# Patient Record
Sex: Female | Born: 1935 | Race: White | Hispanic: No | Smoking: Never smoker
Health system: Southern US, Community
[De-identification: ages and names within clinical notes are randomized; demographics above are authoritative.]

## PROBLEM LIST (undated history)

## (undated) DIAGNOSIS — K209 Esophagitis, unspecified without bleeding: Secondary | ICD-10-CM

## (undated) DIAGNOSIS — D126 Benign neoplasm of colon, unspecified: Secondary | ICD-10-CM

## (undated) DIAGNOSIS — I5032 Chronic diastolic (congestive) heart failure: Secondary | ICD-10-CM

## (undated) DIAGNOSIS — D649 Anemia, unspecified: Secondary | ICD-10-CM

## (undated) DIAGNOSIS — K449 Diaphragmatic hernia without obstruction or gangrene: Secondary | ICD-10-CM

## (undated) DIAGNOSIS — I4891 Unspecified atrial fibrillation: Principal | ICD-10-CM

## (undated) DIAGNOSIS — R1310 Dysphagia, unspecified: Secondary | ICD-10-CM

## (undated) DIAGNOSIS — I10 Essential (primary) hypertension: Secondary | ICD-10-CM

## (undated) DIAGNOSIS — K219 Gastro-esophageal reflux disease without esophagitis: Secondary | ICD-10-CM

## (undated) DIAGNOSIS — I350 Nonrheumatic aortic (valve) stenosis: Secondary | ICD-10-CM

## (undated) DIAGNOSIS — Z8679 Personal history of other diseases of the circulatory system: Secondary | ICD-10-CM

## (undated) HISTORY — DX: Benign neoplasm of colon, unspecified: D12.6

## (undated) HISTORY — PX: ESOPHAGEAL DILATION: SHX303

## (undated) HISTORY — PX: TONSILLECTOMY: SUR1361

---

## 1965-01-26 HISTORY — PX: ABDOMINAL HYSTERECTOMY: SHX81

## 1987-10-01 ENCOUNTER — Encounter (INDEPENDENT_AMBULATORY_CARE_PROVIDER_SITE_OTHER): Payer: Self-pay | Admitting: Gastroenterology

## 1987-10-01 DIAGNOSIS — K209 Esophagitis, unspecified without bleeding: Secondary | ICD-10-CM | POA: Insufficient documentation

## 1988-09-26 HISTORY — PX: CATARACT EXTRACTION W/ INTRAOCULAR LENS  IMPLANT, BILATERAL: SHX1307

## 1998-08-02 ENCOUNTER — Encounter: Payer: Self-pay | Admitting: Gastroenterology

## 1998-08-02 DIAGNOSIS — K5730 Diverticulosis of large intestine without perforation or abscess without bleeding: Secondary | ICD-10-CM | POA: Insufficient documentation

## 2000-06-02 ENCOUNTER — Encounter: Payer: Self-pay | Admitting: Gastroenterology

## 2000-08-04 ENCOUNTER — Encounter (INDEPENDENT_AMBULATORY_CARE_PROVIDER_SITE_OTHER): Payer: Self-pay | Admitting: Specialist

## 2000-08-04 ENCOUNTER — Ambulatory Visit (HOSPITAL_COMMUNITY): Admission: RE | Admit: 2000-08-04 | Discharge: 2000-08-04 | Payer: Self-pay | Admitting: Oncology

## 2002-08-15 ENCOUNTER — Emergency Department (HOSPITAL_COMMUNITY): Admission: EM | Admit: 2002-08-15 | Discharge: 2002-08-16 | Payer: Self-pay | Admitting: *Deleted

## 2002-12-11 ENCOUNTER — Encounter: Payer: Self-pay | Admitting: Gastroenterology

## 2002-12-11 ENCOUNTER — Ambulatory Visit (HOSPITAL_COMMUNITY): Admission: RE | Admit: 2002-12-11 | Discharge: 2002-12-11 | Payer: Self-pay | Admitting: Gastroenterology

## 2002-12-12 ENCOUNTER — Ambulatory Visit (HOSPITAL_COMMUNITY): Admission: RE | Admit: 2002-12-12 | Discharge: 2002-12-12 | Payer: Self-pay | Admitting: Gastroenterology

## 2002-12-13 ENCOUNTER — Ambulatory Visit (HOSPITAL_COMMUNITY): Admission: RE | Admit: 2002-12-13 | Discharge: 2002-12-13 | Payer: Self-pay | Admitting: Gastroenterology

## 2002-12-13 ENCOUNTER — Encounter: Payer: Self-pay | Admitting: Gastroenterology

## 2004-02-21 ENCOUNTER — Ambulatory Visit: Payer: Self-pay | Admitting: Family Medicine

## 2004-05-28 ENCOUNTER — Ambulatory Visit: Payer: Self-pay | Admitting: Family Medicine

## 2004-09-12 ENCOUNTER — Ambulatory Visit: Payer: Self-pay | Admitting: Oncology

## 2004-11-19 ENCOUNTER — Ambulatory Visit: Payer: Self-pay | Admitting: Family Medicine

## 2004-12-24 ENCOUNTER — Ambulatory Visit: Payer: Self-pay | Admitting: Family Medicine

## 2005-04-01 ENCOUNTER — Ambulatory Visit: Payer: Self-pay | Admitting: Family Medicine

## 2005-07-24 ENCOUNTER — Ambulatory Visit: Payer: Self-pay | Admitting: Family Medicine

## 2005-07-27 ENCOUNTER — Ambulatory Visit: Payer: Self-pay | Admitting: Family Medicine

## 2005-08-11 ENCOUNTER — Emergency Department (HOSPITAL_COMMUNITY): Admission: EM | Admit: 2005-08-11 | Discharge: 2005-08-11 | Payer: Self-pay | Admitting: Emergency Medicine

## 2005-08-17 ENCOUNTER — Ambulatory Visit: Payer: Self-pay | Admitting: Family Medicine

## 2005-08-25 ENCOUNTER — Ambulatory Visit: Payer: Self-pay | Admitting: Gastroenterology

## 2005-09-10 ENCOUNTER — Ambulatory Visit: Payer: Self-pay | Admitting: Gastroenterology

## 2005-09-10 DIAGNOSIS — K449 Diaphragmatic hernia without obstruction or gangrene: Secondary | ICD-10-CM | POA: Insufficient documentation

## 2005-09-16 ENCOUNTER — Ambulatory Visit: Payer: Self-pay | Admitting: Family Medicine

## 2005-09-25 ENCOUNTER — Ambulatory Visit: Payer: Self-pay | Admitting: Oncology

## 2005-09-30 LAB — CBC WITH DIFFERENTIAL/PLATELET
BASO%: 0.4 % (ref 0.0–2.0)
Basophils Absolute: 0 10*3/uL (ref 0.0–0.1)
EOS%: 1.4 % (ref 0.0–7.0)
HCT: 29 % — ABNORMAL LOW (ref 34.8–46.6)
MCH: 30.6 pg (ref 26.0–34.0)
MCHC: 35.3 g/dL (ref 32.0–36.0)
MCV: 86.9 fL (ref 81.0–101.0)
MONO%: 8.9 % (ref 0.0–13.0)
NEUT%: 73.4 % (ref 39.6–76.8)
lymph#: 0.8 10*3/uL — ABNORMAL LOW (ref 0.9–3.3)

## 2005-09-30 LAB — MORPHOLOGY

## 2005-10-02 LAB — COMPREHENSIVE METABOLIC PANEL
Albumin: 4.4 g/dL (ref 3.5–5.2)
Alkaline Phosphatase: 65 U/L (ref 39–117)
BUN: 15 mg/dL (ref 6–23)
CO2: 29 mEq/L (ref 19–32)
Calcium: 9 mg/dL (ref 8.4–10.5)
Chloride: 102 mEq/L (ref 96–112)
Glucose, Bld: 105 mg/dL — ABNORMAL HIGH (ref 70–99)
Potassium: 3.9 mEq/L (ref 3.5–5.3)
Sodium: 139 mEq/L (ref 135–145)
Total Protein: 6.2 g/dL (ref 6.0–8.3)

## 2005-10-02 LAB — LACTATE DEHYDROGENASE: LDH: 118 U/L (ref 94–250)

## 2005-10-02 LAB — SEDIMENTATION RATE: Sed Rate: 8 mm/hr (ref 0–22)

## 2005-10-02 LAB — FERRITIN: Ferritin: 96 ng/mL (ref 10–291)

## 2005-10-02 LAB — PROTEIN ELECTROPHORESIS, SERUM
Albumin ELP: 66.1 % (ref 55.8–66.1)
Alpha-1-Globulin: 4.4 % (ref 2.9–4.9)
Alpha-2-Globulin: 7.5 % (ref 7.1–11.8)
Total Protein, Serum Electrophoresis: 6.2 g/dL (ref 6.0–8.3)

## 2005-10-02 LAB — IRON AND TIBC: %SAT: 26 % (ref 20–55)

## 2005-11-16 ENCOUNTER — Ambulatory Visit: Payer: Self-pay | Admitting: Family Medicine

## 2005-12-03 ENCOUNTER — Ambulatory Visit: Payer: Self-pay | Admitting: Oncology

## 2005-12-07 LAB — CBC & DIFF AND RETIC
BASO%: 0.3 % (ref 0.0–2.0)
Eosinophils Absolute: 0.1 10*3/uL (ref 0.0–0.5)
MCHC: 34.7 g/dL (ref 32.0–36.0)
MONO#: 0.5 10*3/uL (ref 0.1–0.9)
NEUT#: 5.3 10*3/uL (ref 1.5–6.5)
RBC: 3.42 10*6/uL — ABNORMAL LOW (ref 3.70–5.32)
RDW: 15.2 % — ABNORMAL HIGH (ref 11.3–14.5)
RETIC #: 109.8 10*3/uL (ref 19.7–115.1)
Retic %: 3.2 % — ABNORMAL HIGH (ref 0.4–2.3)
WBC: 6.7 10*3/uL (ref 3.9–10.0)
lymph#: 0.9 10*3/uL (ref 0.9–3.3)

## 2005-12-07 LAB — MORPHOLOGY

## 2005-12-07 LAB — CHCC SMEAR

## 2005-12-08 ENCOUNTER — Ambulatory Visit: Payer: Self-pay | Admitting: Family Medicine

## 2005-12-14 ENCOUNTER — Ambulatory Visit: Payer: Self-pay | Admitting: Family Medicine

## 2006-01-12 LAB — IRON AND TIBC
%SAT: 21 % (ref 20–55)
TIBC: 240 ug/dL — ABNORMAL LOW (ref 250–470)

## 2006-01-12 LAB — MORPHOLOGY: PLT EST: ADEQUATE

## 2006-01-12 LAB — CBC & DIFF AND RETIC
Basophils Absolute: 0 10*3/uL (ref 0.0–0.1)
Eosinophils Absolute: 0 10*3/uL (ref 0.0–0.5)
HCT: 30.7 % — ABNORMAL LOW (ref 34.8–46.6)
HGB: 10.5 g/dL — ABNORMAL LOW (ref 11.6–15.9)
LYMPH%: 12.8 % — ABNORMAL LOW (ref 14.0–48.0)
MCV: 87.1 fL (ref 81.0–101.0)
MONO%: 5.7 % (ref 0.0–13.0)
NEUT#: 4.4 10*3/uL (ref 1.5–6.5)
NEUT%: 80.5 % — ABNORMAL HIGH (ref 39.6–76.8)
Platelets: 222 10*3/uL (ref 145–400)
RDW: 15.7 % — ABNORMAL HIGH (ref 11.3–14.5)
RETIC #: 94.7 10*3/uL (ref 19.7–115.1)

## 2006-01-12 LAB — LACTATE DEHYDROGENASE: LDH: 123 U/L (ref 94–250)

## 2006-01-12 LAB — COMPREHENSIVE METABOLIC PANEL
ALT: 8 U/L (ref 0–35)
AST: 12 U/L (ref 0–37)
Alkaline Phosphatase: 71 U/L (ref 39–117)
Calcium: 9.1 mg/dL (ref 8.4–10.5)
Chloride: 103 mEq/L (ref 96–112)
Creatinine, Ser: 0.97 mg/dL (ref 0.40–1.20)
Total Bilirubin: 1 mg/dL (ref 0.3–1.2)

## 2006-01-12 LAB — FERRITIN: Ferritin: 117 ng/mL (ref 10–291)

## 2006-01-17 ENCOUNTER — Ambulatory Visit: Payer: Self-pay | Admitting: Oncology

## 2006-03-11 ENCOUNTER — Ambulatory Visit: Payer: Self-pay | Admitting: Family Medicine

## 2006-05-11 ENCOUNTER — Ambulatory Visit: Payer: Self-pay | Admitting: Oncology

## 2006-05-14 LAB — CBC WITH DIFFERENTIAL/PLATELET
Eosinophils Absolute: 0 10*3/uL (ref 0.0–0.5)
MCV: 85.2 fL (ref 81.0–101.0)
MONO#: 0.4 10*3/uL (ref 0.1–0.9)
MONO%: 6.7 % (ref 0.0–13.0)
NEUT#: 4.1 10*3/uL (ref 1.5–6.5)
RBC: 3.35 10*6/uL — ABNORMAL LOW (ref 3.70–5.32)
RDW: 15.7 % — ABNORMAL HIGH (ref 11.3–14.5)
WBC: 5.3 10*3/uL (ref 3.9–10.0)

## 2006-05-14 LAB — CHCC SMEAR

## 2006-05-14 LAB — MORPHOLOGY: Platelet Morphology: NORMAL

## 2006-05-14 LAB — ERYTHROCYTE SEDIMENTATION RATE: Sed Rate: 7 mm/hr (ref 0–30)

## 2006-07-12 ENCOUNTER — Ambulatory Visit: Payer: Self-pay | Admitting: Family Medicine

## 2006-08-04 ENCOUNTER — Ambulatory Visit: Payer: Self-pay | Admitting: Gastroenterology

## 2006-11-24 ENCOUNTER — Ambulatory Visit: Payer: Self-pay | Admitting: Oncology

## 2006-11-26 LAB — CBC & DIFF AND RETIC
BASO%: 0.1 % (ref 0.0–2.0)
Eosinophils Absolute: 0.1 10*3/uL (ref 0.0–0.5)
MONO#: 0.4 10*3/uL (ref 0.1–0.9)
NEUT#: 4.5 10*3/uL (ref 1.5–6.5)
RBC: 3.53 10*6/uL — ABNORMAL LOW (ref 3.70–5.32)
RDW: 15.8 % — ABNORMAL HIGH (ref 11.3–14.5)
RETIC #: 110.8 10*3/uL (ref 19.7–115.1)
Retic %: 3.1 % — ABNORMAL HIGH (ref 0.4–2.3)
WBC: 5.8 10*3/uL (ref 3.9–10.0)
lymph#: 0.8 10*3/uL — ABNORMAL LOW (ref 0.9–3.3)

## 2006-11-26 LAB — MORPHOLOGY

## 2006-11-29 LAB — COMPREHENSIVE METABOLIC PANEL
AST: 12 U/L (ref 0–37)
Albumin: 4.4 g/dL (ref 3.5–5.2)
Alkaline Phosphatase: 67 U/L (ref 39–117)
BUN: 20 mg/dL (ref 6–23)
Calcium: 9.2 mg/dL (ref 8.4–10.5)
Creatinine, Ser: 0.77 mg/dL (ref 0.40–1.20)
Glucose, Bld: 101 mg/dL — ABNORMAL HIGH (ref 70–99)
Potassium: 4.1 mEq/L (ref 3.5–5.3)

## 2006-11-29 LAB — IRON AND TIBC
Iron: 40 ug/dL — ABNORMAL LOW (ref 42–145)
TIBC: 259 ug/dL (ref 250–470)
UIBC: 219 ug/dL

## 2006-11-29 LAB — FERRITIN: Ferritin: 128 ng/mL (ref 10–291)

## 2007-05-14 DIAGNOSIS — K219 Gastro-esophageal reflux disease without esophagitis: Secondary | ICD-10-CM

## 2007-05-14 DIAGNOSIS — I10 Essential (primary) hypertension: Secondary | ICD-10-CM

## 2007-05-14 DIAGNOSIS — D539 Nutritional anemia, unspecified: Secondary | ICD-10-CM

## 2007-05-14 DIAGNOSIS — R1319 Other dysphagia: Secondary | ICD-10-CM

## 2007-06-27 ENCOUNTER — Ambulatory Visit: Payer: Self-pay | Admitting: Oncology

## 2007-06-29 ENCOUNTER — Encounter: Payer: Self-pay | Admitting: Gastroenterology

## 2007-06-29 LAB — CBC & DIFF AND RETIC
Basophils Absolute: 0 10*3/uL (ref 0.0–0.1)
EOS%: 1.2 % (ref 0.0–7.0)
Eosinophils Absolute: 0.1 10*3/uL (ref 0.0–0.5)
HGB: 10 g/dL — ABNORMAL LOW (ref 11.6–15.9)
LYMPH%: 15.2 % (ref 14.0–48.0)
MCH: 30.5 pg (ref 26.0–34.0)
MCV: 85.8 fL (ref 81.0–101.0)
MONO%: 7.8 % (ref 0.0–13.0)
NEUT%: 75.5 % (ref 39.6–76.8)
Platelets: 205 10*3/uL (ref 145–400)
RDW: 16 % — ABNORMAL HIGH (ref 11.3–14.5)

## 2007-06-29 LAB — MORPHOLOGY: PLT EST: ADEQUATE

## 2007-06-29 LAB — COMPREHENSIVE METABOLIC PANEL
Alkaline Phosphatase: 59 U/L (ref 39–117)
BUN: 21 mg/dL (ref 6–23)
Creatinine, Ser: 0.93 mg/dL (ref 0.40–1.20)
Glucose, Bld: 96 mg/dL (ref 70–99)
Total Bilirubin: 1.1 mg/dL (ref 0.3–1.2)

## 2008-01-02 ENCOUNTER — Ambulatory Visit: Payer: Self-pay | Admitting: Oncology

## 2008-01-04 LAB — CBC & DIFF AND RETIC
BASO%: 0.3 % (ref 0.0–2.0)
Basophils Absolute: 0 10*3/uL (ref 0.0–0.1)
EOS%: 1.3 % (ref 0.0–7.0)
Eosinophils Absolute: 0.1 10*3/uL (ref 0.0–0.5)
HCT: 30.6 % — ABNORMAL LOW (ref 34.8–46.6)
HGB: 10.7 g/dL — ABNORMAL LOW (ref 11.6–15.9)
IRF: 0.37 — ABNORMAL HIGH (ref 0.130–0.330)
LYMPH%: 15 % (ref 14.0–48.0)
MCH: 30.8 pg (ref 26.0–34.0)
MCHC: 35 g/dL (ref 32.0–36.0)
MCV: 88 fL (ref 81.0–101.0)
MONO#: 0.3 10*3/uL (ref 0.1–0.9)
MONO%: 6.2 % (ref 0.0–13.0)
NEUT#: 3.6 10*3/uL (ref 1.5–6.5)
NEUT%: 77.2 % — ABNORMAL HIGH (ref 39.6–76.8)
Platelets: 208 10*3/uL (ref 145–400)
RBC: 3.47 10*6/uL — ABNORMAL LOW (ref 3.70–5.32)
RDW: 15.9 % — ABNORMAL HIGH (ref 11.3–14.5)
RETIC #: 127 10*3/uL — ABNORMAL HIGH (ref 19.7–115.1)
Retic %: 3.7 % — ABNORMAL HIGH (ref 0.4–2.3)
WBC: 4.7 10*3/uL (ref 3.9–10.0)
lymph#: 0.7 10*3/uL — ABNORMAL LOW (ref 0.9–3.3)

## 2008-01-04 LAB — CHCC SMEAR

## 2008-01-04 LAB — MORPHOLOGY: PLT EST: ADEQUATE

## 2008-01-05 LAB — LACTATE DEHYDROGENASE: LDH: 117 U/L (ref 94–250)

## 2008-01-05 LAB — COMPREHENSIVE METABOLIC PANEL
ALT: 11 U/L (ref 0–35)
AST: 13 U/L (ref 0–37)
CO2: 25 mEq/L (ref 19–32)
Chloride: 104 mEq/L (ref 96–112)
Sodium: 141 mEq/L (ref 135–145)
Total Bilirubin: 1.1 mg/dL (ref 0.3–1.2)
Total Protein: 6.6 g/dL (ref 6.0–8.3)

## 2008-01-12 ENCOUNTER — Encounter: Payer: Self-pay | Admitting: Gastroenterology

## 2008-07-09 ENCOUNTER — Ambulatory Visit: Payer: Self-pay | Admitting: Oncology

## 2008-07-11 ENCOUNTER — Encounter: Payer: Self-pay | Admitting: Gastroenterology

## 2008-07-11 LAB — CBC & DIFF AND RETIC
BASO%: 0.2 % (ref 0.0–2.0)
Basophils Absolute: 0 10*3/uL (ref 0.0–0.1)
EOS%: 1.3 % (ref 0.0–7.0)
Eosinophils Absolute: 0.1 10*3/uL (ref 0.0–0.5)
HCT: 29.4 % — ABNORMAL LOW (ref 34.8–46.6)
HGB: 10.6 g/dL — ABNORMAL LOW (ref 11.6–15.9)
IRF: 0.35 — ABNORMAL HIGH (ref 0.130–0.330)
LYMPH%: 16.7 % (ref 14.0–49.7)
MCH: 31.3 pg (ref 25.1–34.0)
MCHC: 36 g/dL (ref 31.5–36.0)
MCV: 86.9 fL (ref 79.5–101.0)
MONO#: 0.4 10*3/uL (ref 0.1–0.9)
MONO%: 8.6 % (ref 0.0–14.0)
NEUT#: 3.7 10*3/uL (ref 1.5–6.5)
NEUT%: 73.2 % (ref 38.4–76.8)
Platelets: 189 10*3/uL (ref 145–400)
RBC: 3.38 10*6/uL — ABNORMAL LOW (ref 3.70–5.45)
RDW: 16.4 % — ABNORMAL HIGH (ref 11.2–14.5)
RETIC #: 114.6 10*3/uL (ref 19.7–115.1)
Retic %: 3.4 % — ABNORMAL HIGH (ref 0.4–2.3)
WBC: 5 10*3/uL (ref 3.9–10.3)
lymph#: 0.8 10*3/uL — ABNORMAL LOW (ref 0.9–3.3)

## 2008-07-11 LAB — MORPHOLOGY: PLT EST: ADEQUATE

## 2008-07-11 LAB — COMPREHENSIVE METABOLIC PANEL
ALT: 11 U/L (ref 0–35)
AST: 17 U/L (ref 0–37)
Albumin: 4.1 g/dL (ref 3.5–5.2)
Alkaline Phosphatase: 51 U/L (ref 39–117)
BUN: 20 mg/dL (ref 6–23)
CO2: 31 mEq/L (ref 19–32)
Calcium: 9 mg/dL (ref 8.4–10.5)
Chloride: 102 mEq/L (ref 96–112)
Creatinine, Ser: 0.93 mg/dL (ref 0.40–1.20)
Glucose, Bld: 114 mg/dL — ABNORMAL HIGH (ref 70–99)
Potassium: 4 mEq/L (ref 3.5–5.3)
Sodium: 138 mEq/L (ref 135–145)
Total Bilirubin: 1.3 mg/dL — ABNORMAL HIGH (ref 0.3–1.2)
Total Protein: 6.3 g/dL (ref 6.0–8.3)

## 2008-07-11 LAB — LACTATE DEHYDROGENASE: LDH: 105 U/L (ref 94–250)

## 2008-07-11 LAB — CHCC SMEAR

## 2008-10-26 ENCOUNTER — Emergency Department (HOSPITAL_COMMUNITY): Admission: EM | Admit: 2008-10-26 | Discharge: 2008-10-26 | Payer: Self-pay | Admitting: Emergency Medicine

## 2008-10-26 ENCOUNTER — Telehealth: Payer: Self-pay | Admitting: Gastroenterology

## 2008-10-29 ENCOUNTER — Telehealth: Payer: Self-pay | Admitting: Gastroenterology

## 2008-10-29 ENCOUNTER — Ambulatory Visit: Payer: Self-pay | Admitting: Gastroenterology

## 2008-10-29 DIAGNOSIS — R197 Diarrhea, unspecified: Secondary | ICD-10-CM

## 2008-10-29 DIAGNOSIS — R143 Flatulence: Secondary | ICD-10-CM

## 2008-10-29 DIAGNOSIS — R141 Gas pain: Secondary | ICD-10-CM

## 2008-10-29 DIAGNOSIS — R142 Eructation: Secondary | ICD-10-CM

## 2008-10-29 LAB — CONVERTED CEMR LAB
ALT: 13 units/L (ref 0–35)
AST: 17 units/L (ref 0–37)
Albumin: 4.4 g/dL (ref 3.5–5.2)
Alkaline Phosphatase: 63 units/L (ref 39–117)
BUN: 15 mg/dL (ref 6–23)
Basophils Absolute: 0 10*3/uL (ref 0.0–0.1)
Basophils Relative: 0.2 % (ref 0.0–3.0)
CO2: 31 meq/L (ref 19–32)
Calcium: 9 mg/dL (ref 8.4–10.5)
Chloride: 107 meq/L (ref 96–112)
Creatinine, Ser: 0.9 mg/dL (ref 0.4–1.2)
Eosinophils Absolute: 0.1 10*3/uL (ref 0.0–0.7)
Eosinophils Relative: 0.8 % (ref 0.0–5.0)
GFR calc non Af Amer: 63.16 mL/min (ref >60)
Glucose, Bld: 110 mg/dL — ABNORMAL HIGH (ref 70–99)
HCT: 33.7 % — ABNORMAL LOW (ref 36.0–46.0)
Hemoglobin: 11.4 g/dL — ABNORMAL LOW (ref 12.0–15.0)
Lymphocytes Relative: 14.1 % (ref 12.0–46.0)
Lymphs Abs: 0.9 10*3/uL (ref 0.7–4.0)
MCHC: 34 g/dL (ref 30.0–36.0)
MCV: 89.3 fL (ref 78.0–100.0)
Monocytes Absolute: 0.5 10*3/uL (ref 0.1–1.0)
Monocytes Relative: 8.1 % (ref 3.0–12.0)
Neutro Abs: 4.8 10*3/uL (ref 1.4–7.7)
Neutrophils Relative %: 76.8 % (ref 43.0–77.0)
Platelets: 229 10*3/uL (ref 150.0–400.0)
Potassium: 3.6 meq/L (ref 3.5–5.1)
RBC: 3.77 M/uL — ABNORMAL LOW (ref 3.87–5.11)
RDW: 15.4 % — ABNORMAL HIGH (ref 11.5–14.6)
Sodium: 142 meq/L (ref 135–145)
TSH: 1.15 microintl units/mL (ref 0.35–5.50)
Total Bilirubin: 1.4 mg/dL — ABNORMAL HIGH (ref 0.3–1.2)
Total Protein: 7.6 g/dL (ref 6.0–8.3)
WBC: 6.3 10*3/uL (ref 4.5–10.5)

## 2008-11-01 ENCOUNTER — Ambulatory Visit: Payer: Self-pay | Admitting: Gastroenterology

## 2008-11-02 LAB — CONVERTED CEMR LAB
Ferritin: 92.3 ng/mL (ref 10.0–291.0)
Iron: 59 ug/dL (ref 42–145)
Saturation Ratios: 23.5 % (ref 20.0–50.0)
Transferrin: 179.4 mg/dL — ABNORMAL LOW (ref 212.0–360.0)
Vitamin B-12: 784 pg/mL (ref 211–911)

## 2008-11-14 ENCOUNTER — Encounter (INDEPENDENT_AMBULATORY_CARE_PROVIDER_SITE_OTHER): Payer: Self-pay | Admitting: *Deleted

## 2008-11-14 ENCOUNTER — Ambulatory Visit: Payer: Self-pay | Admitting: Gastroenterology

## 2008-11-14 DIAGNOSIS — R198 Other specified symptoms and signs involving the digestive system and abdomen: Secondary | ICD-10-CM

## 2008-11-26 DIAGNOSIS — D126 Benign neoplasm of colon, unspecified: Secondary | ICD-10-CM

## 2008-11-26 HISTORY — DX: Benign neoplasm of colon, unspecified: D12.6

## 2008-12-19 ENCOUNTER — Ambulatory Visit: Payer: Self-pay | Admitting: Gastroenterology

## 2008-12-25 ENCOUNTER — Encounter: Payer: Self-pay | Admitting: Gastroenterology

## 2009-04-15 ENCOUNTER — Encounter: Payer: Self-pay | Admitting: Gastroenterology

## 2009-07-26 ENCOUNTER — Ambulatory Visit: Payer: Self-pay | Admitting: Oncology

## 2009-07-31 LAB — COMPREHENSIVE METABOLIC PANEL
ALT: 10 U/L (ref 0–35)
AST: 14 U/L (ref 0–37)
Albumin: 4.2 g/dL (ref 3.5–5.2)
Alkaline Phosphatase: 61 U/L (ref 39–117)
BUN: 20 mg/dL (ref 6–23)
CO2: 22 mEq/L (ref 19–32)
Calcium: 9.2 mg/dL (ref 8.4–10.5)
Chloride: 102 mEq/L (ref 96–112)
Creatinine, Ser: 0.98 mg/dL (ref 0.40–1.20)
Glucose, Bld: 134 mg/dL — ABNORMAL HIGH (ref 70–99)
Potassium: 4.1 mEq/L (ref 3.5–5.3)
Sodium: 138 mEq/L (ref 135–145)
Total Bilirubin: 1.1 mg/dL (ref 0.3–1.2)
Total Protein: 6.4 g/dL (ref 6.0–8.3)

## 2009-07-31 LAB — CBC & DIFF AND RETIC
BASO%: 0.2 % (ref 0.0–2.0)
Basophils Absolute: 0 10*3/uL (ref 0.0–0.1)
EOS%: 2.2 % (ref 0.0–7.0)
Eosinophils Absolute: 0.1 10*3/uL (ref 0.0–0.5)
HCT: 30.3 % — ABNORMAL LOW (ref 34.8–46.6)
HGB: 10 g/dL — ABNORMAL LOW (ref 11.6–15.9)
Immature Retic Fract: 4 % (ref 0.00–10.70)
LYMPH%: 16.4 % (ref 14.0–49.7)
MCH: 29.2 pg (ref 25.1–34.0)
MCHC: 33 g/dL (ref 31.5–36.0)
MCV: 88.6 fL (ref 79.5–101.0)
MONO#: 0.5 10*3/uL (ref 0.1–0.9)
MONO%: 9.5 % (ref 0.0–14.0)
NEUT#: 3.5 10*3/uL (ref 1.5–6.5)
NEUT%: 71.7 % (ref 38.4–76.8)
Platelets: 175 10*3/uL (ref 145–400)
RBC: 3.42 10*6/uL — ABNORMAL LOW (ref 3.70–5.45)
RDW: 15.2 % — ABNORMAL HIGH (ref 11.2–14.5)
Retic %: 2.27 % — ABNORMAL HIGH (ref 0.50–1.50)
Retic Ct Abs: 77.63 10*3/uL — ABNORMAL HIGH (ref 18.30–72.70)
WBC: 4.9 10*3/uL (ref 3.9–10.3)
lymph#: 0.8 10*3/uL — ABNORMAL LOW (ref 0.9–3.3)

## 2009-07-31 LAB — MORPHOLOGY: PLT EST: ADEQUATE

## 2009-07-31 LAB — LACTATE DEHYDROGENASE: LDH: 120 U/L (ref 94–250)

## 2009-07-31 LAB — FERRITIN: Ferritin: 105 ng/mL (ref 10–291)

## 2009-07-31 LAB — IRON AND TIBC
%SAT: 24 % (ref 20–55)
Iron: 59 ug/dL (ref 42–145)
TIBC: 241 ug/dL — ABNORMAL LOW (ref 250–470)
UIBC: 182 ug/dL

## 2009-07-31 LAB — CHCC SMEAR

## 2009-08-02 ENCOUNTER — Encounter: Payer: Self-pay | Admitting: Gastroenterology

## 2009-10-28 ENCOUNTER — Encounter (INDEPENDENT_AMBULATORY_CARE_PROVIDER_SITE_OTHER): Payer: Self-pay | Admitting: Family Medicine

## 2009-10-28 ENCOUNTER — Ambulatory Visit: Payer: Self-pay

## 2009-10-28 ENCOUNTER — Ambulatory Visit (HOSPITAL_COMMUNITY): Admission: RE | Admit: 2009-10-28 | Discharge: 2009-10-28 | Payer: Self-pay | Admitting: Family Medicine

## 2009-10-28 ENCOUNTER — Ambulatory Visit: Payer: Self-pay | Admitting: Cardiology

## 2010-02-25 NOTE — Letter (Signed)
[facility]   Imported By: Sherian Rein 08/07/2009 13:23:06  _____________________________________________________________________  External Attachment:    Type:   Image     Comment:   External Document

## 2010-02-25 NOTE — Letter (Signed)
Summary: [facility]  [facility]   Imported By: Lester Hays 10/09/2009 11:36:22  _____________________________________________________________________  External Attachment:    Type:   Image     Comment:   External Document

## 2010-06-10 NOTE — Assessment & Plan Note (Signed)
[facility]                         GASTROENTEROLOGY OFFICE NOTE   NAME:LIGONHelia, Elliott                        MRN:          [id number]  DATE:08/04/2006                            DOB:          1921-04-05    Anna Elliott is seen today for a followup of GERD complicated by  dysphagia.  In addition, she has a history of chronic anemia with ringed  sideroblasts.  Iron deficiency was not identified.  The patient and her  daughter report that stool Hemoccults in April 2008 in Dr. Joyce Copa  office were negative.  She has no lower gastrointestinal complaints, and  specifically denies any change in bowel habits, change in stool caliber,  weight loss, melena or hematochezia.  She has no dysphagia, odynophagia,  or reflux symptoms, and her Prevacid appears to be working quite well  for control of the above. She previously had the colonoscopy in July of  2000 which showed severe diverticulosis and no other abnormalities.   CURRENT MEDICATIONS:  Listed on the chart, updated and reviewed.   MEDICATION ALLERGIES:  CODEINE and SULFA DRUGS.   PHYSICAL EXAMINATION:  No acute distress.  Weight 135.2 pounds.  Blood pressure is 122/64, pulse is 84 and regular.  CHEST:  Clear to auscultation bilaterally.  CARDIAC:  Regular rate and rhythm without murmurs.  ABDOMEN:  Soft and nontender with normoactive bowel sounds.  No palpable  organomegaly, masses, or hernias.   ASSESSMENT AND PLAN:  1. Gastroesophageal reflux disease.  Maintain Prevacid 30 mg p.o.      b.i.d. along with standard antireflux measures.  2. Chronic anemia with ringed sideroblasts.  Ongoing followup with Dr.      Pierce Elliott.  Given her age, hemoccult-negative stool, no known      history of iron deficiency and patient's preference to avoid a      repeat colonoscopy, we will defer on colonoscopy.     Anna Lick. [name], MD, [facility]  Electronically Signed    MTS/MedQ  DD: 08/06/2006  DT: 08/06/2006  Job  #: [id number]   cc:   Anna Elliott, M.D.  Anna Elliott, M.D.

## 2010-06-13 NOTE — Assessment & Plan Note (Signed)
[facility]                           GASTROENTEROLOGY OFFICE NOTE   NAME:LIGONAmanat, Hackel                        MRN:          [id number]  DATE:08/25/2005                            DOB:          1921/06/12    Ms. Barlett returns for followup today complaining of worsening problems with  reflux and regurgitation for about the past two months.  She has noticed the  onset of solid food dysphagia for the past two weeks.  She has been  maintained on Prevacid q. day on a chronic basis and this was recently  increased to b.i.d. about two weeks ago by Dr. Lysbeth Galas.  She has had ongoing  throat clearing and an occasional hoarse voice.  Esophageal monometry study  in November of 2004 was normal.  Upper endoscopy in May of 2005 showed only  a small hiatal hernia.  Subsequent endoscopy for ongoing dysphagia performed  in November of 2004 again showed only a small hiatal hernia and empiric  Savory dilation to 17 ms was performed and she did appear to have some  symptomatic relief from this dilation.   CURRENT MEDICATIONS:  Listed on the chart.  Updated and reviewed.   ALLERGIES:  CODEINE and SULFA drugs.   EXAMINATION:  GENERAL:  No acute distress.  VITAL SIGNS:  Weight 133.4 pounds.  Blood pressure is 124/66.  Pulse is 80  and regular.  HEENT:  Anicteric sclerae, oropharynx clear.  CHEST:  Clear to auscultation bilaterally.  CARDIAC:  Without murmurs.  ABDOMEN:  Soft, non-tender.  Normoactive bowel sounds.  No palpable  organomegaly, masses or hernias.   ASSESSMENT/PLAN:  Gastroesophageal reflux disease with recurrent dysphagia.  Rule out erosive esophagitis, strictures, infectious esophagitis and less  likely upper gastrointestinal tract neoplasms.  Risks, benefits and  alternatives to upper endoscopy with possible biopsy and possible dilation  discussed with the patient and she consents to proceed.  This will be  scheduled electively.  She will remain on  Prevacid Solu-tabs 30 mg p.o.  b.i.d. along with standard anti-reflux measures.                                   Venita Lick. Pleas Koch., MD, [facility]   MTS/MedQ  DD:  08/25/2005  DT:  08/25/2005  Job #:  [id number]   cc:   Delaney Meigs, MD

## 2010-06-13 NOTE — Op Note (Signed)
NAMETANESSA, TIDD                           ACCOUNT NO.:  [id number]   MEDICAL RECORD NO.:  [id number]                   PATIENT TYPE:  AMB   LOCATION:  ENDO                                 FACILITY:  [facility]   PHYSICIAN:  Malcolm T. [name], M.D. [facility]          DATE OF BIRTH:  December 17, 1921   DATE OF PROCEDURE:  12/28/2002  DATE OF DISCHARGE:  12/12/2002                                 OPERATIVE REPORT   PROCEDURE:  Esophageal manometry.   INDICATIONS FOR PROCEDURE:  77 year old white female with ongoing dysphagia.   REPORT:  Upper esophageal sphincter had normal pressures and normal  relaxation.  Esophageal body revealed normal pressures and normal  peristaltic activity.  Lower esophageal sphincter normal pressures and  normal relaxation.  Lower esophageal sphincter resting pressure 29 mmHg,  residual pressure 7.9 mmHg.   IMPRESSION:  Normal esophageal manometry.   RECOMMENDATIONS:  Per office chart notes.                                               Venita Lick. [name], M.D. [facility]    MTS/MEDQ  D:  12/28/2002  T:  12/28/2002  Job:  [id number]

## 2010-08-27 ENCOUNTER — Encounter (HOSPITAL_BASED_OUTPATIENT_CLINIC_OR_DEPARTMENT_OTHER): Payer: Medicare Other | Admitting: Oncology

## 2010-08-27 ENCOUNTER — Other Ambulatory Visit: Payer: Self-pay | Admitting: Oncology

## 2010-08-27 DIAGNOSIS — D649 Anemia, unspecified: Secondary | ICD-10-CM

## 2010-08-27 LAB — COMPREHENSIVE METABOLIC PANEL
ALT: 8 U/L (ref 0–35)
AST: 11 U/L (ref 0–37)
Albumin: 4.2 g/dL (ref 3.5–5.2)
Alkaline Phosphatase: 67 U/L (ref 39–117)
BUN: 23 mg/dL (ref 6–23)
CO2: 24 mEq/L (ref 19–32)
Calcium: 9.2 mg/dL (ref 8.4–10.5)
Chloride: 103 mEq/L (ref 96–112)
Creatinine, Ser: 0.92 mg/dL (ref 0.50–1.10)
Glucose, Bld: 123 mg/dL — ABNORMAL HIGH (ref 70–99)
Potassium: 4.1 mEq/L (ref 3.5–5.3)
Sodium: 139 mEq/L (ref 135–145)
Total Bilirubin: 0.8 mg/dL (ref 0.3–1.2)
Total Protein: 6.5 g/dL (ref 6.0–8.3)

## 2010-08-27 LAB — CBC WITH DIFFERENTIAL/PLATELET
BASO%: 0.2 % (ref 0.0–2.0)
Basophils Absolute: 0 10*3/uL (ref 0.0–0.1)
EOS%: 0.9 % (ref 0.0–7.0)
Eosinophils Absolute: 0.1 10*3/uL (ref 0.0–0.5)
HCT: 31.9 % — ABNORMAL LOW (ref 34.8–46.6)
HGB: 10.6 g/dL — ABNORMAL LOW (ref 11.6–15.9)
LYMPH%: 14.3 % (ref 14.0–49.7)
MCH: 29 pg (ref 25.1–34.0)
MCHC: 33.2 g/dL (ref 31.5–36.0)
MCV: 87.4 fL (ref 79.5–101.0)
MONO#: 0.6 10*3/uL (ref 0.1–0.9)
MONO%: 8.9 % (ref 0.0–14.0)
NEUT#: 4.9 10*3/uL (ref 1.5–6.5)
NEUT%: 75.7 % (ref 38.4–76.8)
Platelets: 196 10*3/uL (ref 145–400)
RBC: 3.65 10*6/uL — ABNORMAL LOW (ref 3.70–5.45)
RDW: 14.8 % — ABNORMAL HIGH (ref 11.2–14.5)
WBC: 6.5 10*3/uL (ref 3.9–10.3)
lymph#: 0.9 10*3/uL (ref 0.9–3.3)

## 2010-08-27 LAB — IRON AND TIBC
%SAT: 19 % — ABNORMAL LOW (ref 20–55)
Iron: 47 ug/dL (ref 42–145)
TIBC: 248 ug/dL — ABNORMAL LOW (ref 250–470)
UIBC: 201 ug/dL

## 2010-08-27 LAB — CHCC SMEAR

## 2010-08-27 LAB — LACTATE DEHYDROGENASE: LDH: 119 U/L (ref 94–250)

## 2011-01-17 ENCOUNTER — Telehealth: Payer: Self-pay | Admitting: *Deleted

## 2011-01-17 NOTE — Telephone Encounter (Signed)
mailed out calendar and letter to inform the patient of the new date and time on 08-27-2011 and the lab a week before

## 2011-08-20 ENCOUNTER — Other Ambulatory Visit (HOSPITAL_BASED_OUTPATIENT_CLINIC_OR_DEPARTMENT_OTHER): Payer: Medicare Other | Admitting: Lab

## 2011-08-20 DIAGNOSIS — D4620 Refractory anemia with excess of blasts, unspecified: Secondary | ICD-10-CM

## 2011-08-20 LAB — CBC & DIFF AND RETIC
Basophils Absolute: 0 10*3/uL (ref 0.0–0.1)
EOS%: 0.7 % (ref 0.0–7.0)
Eosinophils Absolute: 0 10*3/uL (ref 0.0–0.5)
HGB: 10.2 g/dL — ABNORMAL LOW (ref 11.6–15.9)
LYMPH%: 16.4 % (ref 14.0–49.7)
MCH: 29.6 pg (ref 25.1–34.0)
MCV: 87.2 fL (ref 79.5–101.0)
MONO%: 7.6 % (ref 0.0–14.0)
NEUT#: 4.1 10*3/uL (ref 1.5–6.5)
NEUT%: 75.1 % (ref 38.4–76.8)
Platelets: 179 10*3/uL (ref 145–400)

## 2011-08-20 LAB — CHCC SMEAR

## 2011-08-20 LAB — MORPHOLOGY: PLT EST: ADEQUATE

## 2011-08-21 ENCOUNTER — Telehealth: Payer: Self-pay | Admitting: *Deleted

## 2011-08-21 NOTE — Telephone Encounter (Signed)
patient's daughter called and moved patient to 10-23-2011 starting at 11:30am confimed over the phone on 08-21-2011

## 2011-08-24 LAB — COMPREHENSIVE METABOLIC PANEL
ALT: 8 U/L (ref 0–35)
AST: 12 U/L (ref 0–37)
Alkaline Phosphatase: 59 U/L (ref 39–117)
CO2: 28 mEq/L (ref 19–32)
Creatinine, Ser: 0.86 mg/dL (ref 0.50–1.10)
Total Bilirubin: 1 mg/dL (ref 0.3–1.2)

## 2011-08-24 LAB — IRON AND TIBC
%SAT: 25 % (ref 20–55)
TIBC: 242 ug/dL — ABNORMAL LOW (ref 250–470)

## 2011-08-24 LAB — PROTEIN ELECTROPHORESIS, SERUM
Albumin ELP: 65.2 % (ref 55.8–66.1)
Beta 2: 7.4 % — ABNORMAL HIGH (ref 3.2–6.5)

## 2011-08-24 LAB — LACTATE DEHYDROGENASE: LDH: 112 U/L (ref 94–250)

## 2011-08-24 LAB — FERRITIN: Ferritin: 120 ng/mL (ref 10–291)

## 2011-08-27 ENCOUNTER — Ambulatory Visit: Payer: Medicare Other | Admitting: Oncology

## 2011-10-08 ENCOUNTER — Ambulatory Visit (HOSPITAL_BASED_OUTPATIENT_CLINIC_OR_DEPARTMENT_OTHER): Payer: Medicare Other | Admitting: Oncology

## 2011-10-08 ENCOUNTER — Telehealth: Payer: Self-pay | Admitting: Oncology

## 2011-10-08 VITALS — BP 145/73 | HR 75 | Temp 98.5°F | Resp 20 | Ht 65.0 in | Wt 138.6 lb

## 2011-10-08 DIAGNOSIS — D643 Other sideroblastic anemias: Secondary | ICD-10-CM

## 2011-10-08 DIAGNOSIS — D539 Nutritional anemia, unspecified: Secondary | ICD-10-CM

## 2011-10-08 NOTE — Progress Notes (Signed)
Hematology and Oncology Follow Up Visit  Anna Elliott [id number] 03-Sep-1921 78 y.o. 10/08/2011 12:17 PM   DIAGNOSIS:  cheonic anemia   PAST THERAPY:  Chronic anemia with ring sideroblasts for the past approximately six to seven years. 2. History of hypertension. 3. History of macular degeneration.   Interim History:  Patient is here with her daughter. She is doing well. She has no complaints. Her hemoglobin has been stable. She denies insurance breath or cough. She has no other chronic medical issues.  Medications: I have reviewed the patient's current medications.  Allergies:  Allergies  Allergen Reactions  . Codeine   . Sulfonamide Derivatives     Past Medical History, Surgical history, Social history, and Family History were reviewed and updated.  Review of Systems: Constitutional:  Negative for fever, chills, night sweats, anorexia, weight loss, pain. Cardiovascular: no chest pain or dyspnea on exertion Respiratory: negative Neurological: negative Dermatological: negative ENT: negative Skin Gastrointestinal: negative Genito-Urinary: negative Hematological and Lymphatic: negative Breast: negative Musculoskeletal: negative Remaining ROS negative.  Physical Exam:  Blood pressure 145/73, pulse 75, temperature 98.5 F (36.9 C), temperature source Oral, resp. rate 20, height 5\' 5"  (1.651 m), weight 138 lb 9.6 oz (62.869 kg).  ECOG: 0   HEENT:  Sclerae anicteric, conjunctivae pink.  Oropharynx clear.  No mucositis or candidiasis.  Nodes:  No cervical, supraclavicular, or axillary lymphadenopathy palpated. Lungs:  Clear to auscultation bilaterally.  No crackles, rhonchi, or wheezes.  Heart:  Regular rate and rhythm.  Abdomen:  Soft, nontender.  Positive bowel sounds.  No organomegaly or masses palpated.  Musculoskeletal:  No focal spinal tenderness to palpation.  Extremities:  Benign.  No peripheral edema or cyanosis.  Skin:  Benign.  Neuro:  Nonfocal.    Lab  Results: Lab Results  Component Value Date   WBC 5.4 08/20/2011   HGB 10.2* 08/20/2011   HCT 30.1* 08/20/2011   MCV 87.2 08/20/2011   PLT 179 08/20/2011     Chemistry      Component Value Date/Time   NA 139 08/20/2011 1343   K 4.0 08/20/2011 1343   CL 104 08/20/2011 1343   CO2 28 08/20/2011 1343   BUN 20 08/20/2011 1343   CREATININE 0.86 08/20/2011 1343      Component Value Date/Time   CALCIUM 9.2 08/20/2011 1343   ALKPHOS 59 08/20/2011 1343   AST 12 08/20/2011 1343   ALT 8 08/20/2011 1343   BILITOT 1.0 08/20/2011 1343       Radiological Studies:  No results found.   IMPRESSIONS AND PLAN: A 78 y.o. female with   History of refractory anemia dating back to 2002 when a bone marrow biopsy was performed. This did show evidence of perhaps early mild dysplasia and/or sideroblastic changes. She has not required transfusions and has been stable over the years. I will continue to monitor her on the basis. No intervention currently required.  Her other health care maintenance is up-to-date.      Anna Elliott 9/12/201312:17 PM Cell [phone]

## 2011-10-08 NOTE — Telephone Encounter (Signed)
gv pt appt schedule for September 2014.  °

## 2011-10-23 ENCOUNTER — Ambulatory Visit: Payer: Medicare Other | Admitting: Oncology

## 2012-03-19 ENCOUNTER — Encounter: Payer: Self-pay | Admitting: *Deleted

## 2012-03-19 ENCOUNTER — Telehealth: Payer: Self-pay | Admitting: *Deleted

## 2012-03-19 NOTE — Telephone Encounter (Signed)
Notified pt daughter of schedule and provider change.

## 2012-03-24 ENCOUNTER — Telehealth: Payer: Self-pay | Admitting: Oncology

## 2012-06-17 ENCOUNTER — Ambulatory Visit: Payer: Medicare Other | Attending: Ophthalmology | Admitting: Occupational Therapy

## 2012-06-17 DIAGNOSIS — R269 Unspecified abnormalities of gait and mobility: Secondary | ICD-10-CM | POA: Insufficient documentation

## 2012-06-17 DIAGNOSIS — M25669 Stiffness of unspecified knee, not elsewhere classified: Secondary | ICD-10-CM | POA: Insufficient documentation

## 2012-06-17 DIAGNOSIS — IMO0001 Reserved for inherently not codable concepts without codable children: Secondary | ICD-10-CM | POA: Insufficient documentation

## 2012-06-17 DIAGNOSIS — M25569 Pain in unspecified knee: Secondary | ICD-10-CM | POA: Insufficient documentation

## 2012-06-17 DIAGNOSIS — M6281 Muscle weakness (generalized): Secondary | ICD-10-CM | POA: Insufficient documentation

## 2012-06-22 ENCOUNTER — Inpatient Hospital Stay (HOSPITAL_COMMUNITY)
Admission: EM | Admit: 2012-06-22 | Discharge: 2012-06-24 | DRG: 310 | Disposition: A | Discharge status: Home / Self Care | Payer: Medicare Other | Attending: Cardiology | Admitting: Cardiology

## 2012-06-22 ENCOUNTER — Encounter (HOSPITAL_COMMUNITY): Payer: Self-pay | Admitting: Emergency Medicine

## 2012-06-22 ENCOUNTER — Emergency Department (HOSPITAL_COMMUNITY): Payer: Medicare Other

## 2012-06-22 DIAGNOSIS — I4891 Unspecified atrial fibrillation: Principal | ICD-10-CM | POA: Diagnosis present

## 2012-06-22 DIAGNOSIS — I359 Nonrheumatic aortic valve disorder, unspecified: Secondary | ICD-10-CM | POA: Diagnosis present

## 2012-06-22 DIAGNOSIS — M549 Dorsalgia, unspecified: Secondary | ICD-10-CM | POA: Diagnosis present

## 2012-06-22 DIAGNOSIS — I200 Unstable angina: Secondary | ICD-10-CM

## 2012-06-22 DIAGNOSIS — K219 Gastro-esophageal reflux disease without esophagitis: Secondary | ICD-10-CM | POA: Diagnosis present

## 2012-06-22 DIAGNOSIS — R079 Chest pain, unspecified: Secondary | ICD-10-CM | POA: Diagnosis present

## 2012-06-22 DIAGNOSIS — H3530 Unspecified macular degeneration: Secondary | ICD-10-CM | POA: Diagnosis present

## 2012-06-22 DIAGNOSIS — R55 Syncope and collapse: Secondary | ICD-10-CM | POA: Diagnosis present

## 2012-06-22 DIAGNOSIS — K209 Esophagitis, unspecified without bleeding: Secondary | ICD-10-CM | POA: Diagnosis present

## 2012-06-22 DIAGNOSIS — D649 Anemia, unspecified: Secondary | ICD-10-CM | POA: Diagnosis present

## 2012-06-22 DIAGNOSIS — R1310 Dysphagia, unspecified: Secondary | ICD-10-CM | POA: Diagnosis present

## 2012-06-22 DIAGNOSIS — D539 Nutritional anemia, unspecified: Secondary | ICD-10-CM | POA: Diagnosis present

## 2012-06-22 DIAGNOSIS — I350 Nonrheumatic aortic (valve) stenosis: Secondary | ICD-10-CM | POA: Diagnosis present

## 2012-06-22 DIAGNOSIS — I10 Essential (primary) hypertension: Secondary | ICD-10-CM | POA: Diagnosis present

## 2012-06-22 DIAGNOSIS — K449 Diaphragmatic hernia without obstruction or gangrene: Secondary | ICD-10-CM | POA: Diagnosis present

## 2012-06-22 HISTORY — DX: Anemia, unspecified: D64.9

## 2012-06-22 HISTORY — DX: Esophagitis, unspecified without bleeding: K20.90

## 2012-06-22 HISTORY — DX: Personal history of other diseases of the circulatory system: Z86.79

## 2012-06-22 HISTORY — DX: Unspecified atrial fibrillation: I48.91

## 2012-06-22 HISTORY — DX: Diaphragmatic hernia without obstruction or gangrene: K44.9

## 2012-06-22 HISTORY — DX: Esophagitis, unspecified: K20.9

## 2012-06-22 HISTORY — DX: Dysphagia, unspecified: R13.10

## 2012-06-22 HISTORY — DX: Nonrheumatic aortic (valve) stenosis: I35.0

## 2012-06-22 HISTORY — DX: Essential (primary) hypertension: I10

## 2012-06-22 HISTORY — DX: Gastro-esophageal reflux disease without esophagitis: K21.9

## 2012-06-22 LAB — CBC
Hemoglobin: 10.7 g/dL — ABNORMAL LOW (ref 12.0–15.0)
MCH: 29 pg (ref 26.0–34.0)
MCHC: 33.2 g/dL (ref 30.0–36.0)
RDW: 15.5 % (ref 11.5–15.5)

## 2012-06-22 LAB — POCT I-STAT TROPONIN I

## 2012-06-22 LAB — BASIC METABOLIC PANEL
BUN: 25 mg/dL — ABNORMAL HIGH (ref 6–23)
Calcium: 9 mg/dL (ref 8.4–10.5)
GFR calc Af Amer: 61 mL/min — ABNORMAL LOW (ref >90)
GFR calc non Af Amer: 53 mL/min — ABNORMAL LOW (ref >90)
Glucose, Bld: 118 mg/dL — ABNORMAL HIGH (ref 70–99)
Potassium: 3.8 mEq/L (ref 3.5–5.1)
Sodium: 140 mEq/L (ref 135–145)

## 2012-06-22 LAB — TROPONIN I: Troponin I: <0.3 ng/mL (ref <0.30)

## 2012-06-22 LAB — PRO B NATRIURETIC PEPTIDE: Pro B Natriuretic peptide (BNP): 6460 pg/mL — ABNORMAL HIGH (ref 0–450)

## 2012-06-22 MED ORDER — ATORVASTATIN CALCIUM 80 MG PO TABS
80.0000 mg | ORAL_TABLET | Freq: Every day | ORAL | Status: DC
  Administered 2012-06-23: 80 mg via ORAL
  Filled 2012-06-22 (×3): qty 1

## 2012-06-22 MED ORDER — PANTOPRAZOLE SODIUM 40 MG PO TBEC
40.0000 mg | DELAYED_RELEASE_TABLET | Freq: Every day | ORAL | Status: DC
  Filled 2012-06-22: qty 1

## 2012-06-22 MED ORDER — HEPARIN (PORCINE) IN NACL 100-0.45 UNIT/ML-% IJ SOLN
850.0000 [IU]/h | INTRAMUSCULAR | Status: DC
  Administered 2012-06-22 (×2): 950 [IU]/h via INTRAVENOUS
  Administered 2012-06-23: 850 [IU]/h via INTRAVENOUS
  Administered 2012-06-23: 1000 [IU]/h via INTRAVENOUS
  Administered 2012-06-23: 850 [IU]/h via INTRAVENOUS
  Filled 2012-06-22 (×3): qty 250

## 2012-06-22 MED ORDER — METOPROLOL TARTRATE 12.5 MG HALF TABLET
12.5000 mg | ORAL_TABLET | Freq: Two times a day (BID) | ORAL | Status: DC
  Administered 2012-06-23 (×2): 12.5 mg via ORAL
  Filled 2012-06-22 (×5): qty 1

## 2012-06-22 MED ORDER — SODIUM CHLORIDE 0.9 % IJ SOLN: 3.0000 mL | INTRAMUSCULAR | Status: DC | PRN

## 2012-06-22 MED ORDER — LANSOPRAZOLE 30 MG PO TBDP: 30.0000 mg | ORAL_TABLET | Freq: Every day | ORAL | Status: DC

## 2012-06-22 MED ORDER — ASPIRIN EC 81 MG PO TBEC
81.0000 mg | DELAYED_RELEASE_TABLET | Freq: Every day | ORAL | Status: DC
  Administered 2012-06-23 – 2012-06-24 (×2): 81 mg via ORAL
  Filled 2012-06-22 (×2): qty 1

## 2012-06-22 MED ORDER — HEPARIN BOLUS VIA INFUSION
3500.0000 [IU] | Freq: Once | INTRAVENOUS | Status: AC
  Administered 2012-06-22: 3500 [IU] via INTRAVENOUS

## 2012-06-22 MED ORDER — SODIUM CHLORIDE 0.9 % IJ SOLN
3.0000 mL | Freq: Two times a day (BID) | INTRAMUSCULAR | Status: DC
  Administered 2012-06-23 – 2012-06-24 (×3): 3 mL via INTRAVENOUS

## 2012-06-22 MED ORDER — CYANOCOBALAMIN 500 MCG PO TABS
500.0000 ug | ORAL_TABLET | Freq: Every day | ORAL | Status: DC
  Administered 2012-06-23 – 2012-06-24 (×2): 500 ug via ORAL
  Filled 2012-06-22 (×3): qty 1

## 2012-06-22 MED ORDER — DILTIAZEM LOAD VIA INFUSION
10.0000 mg | Freq: Once | INTRAVENOUS | Status: AC
  Administered 2012-06-22: 10 mg via INTRAVENOUS
  Filled 2012-06-22: qty 10

## 2012-06-22 MED ORDER — DILTIAZEM HCL 100 MG IV SOLR
5.0000 mg/h | INTRAVENOUS | Status: DC
  Administered 2012-06-22 (×2): 5 mg/h via INTRAVENOUS
  Filled 2012-06-22: qty 100

## 2012-06-22 MED ORDER — ZOLPIDEM TARTRATE 5 MG PO TABS
2.5000 mg | ORAL_TABLET | Freq: Every evening | ORAL | Status: DC | PRN
  Administered 2012-06-22 – 2012-06-23 (×2): 2.5 mg via ORAL
  Filled 2012-06-22 (×2): qty 1

## 2012-06-22 MED ORDER — ACETAMINOPHEN 325 MG PO TABS: 650.0000 mg | ORAL_TABLET | ORAL | Status: DC | PRN

## 2012-06-22 MED ORDER — ONDANSETRON HCL 4 MG/2ML IJ SOLN: 4.0000 mg | Freq: Four times a day (QID) | INTRAMUSCULAR | Status: DC | PRN

## 2012-06-22 MED ORDER — OCUVITE-LUTEIN PO CAPS
1.0000 | ORAL_CAPSULE | Freq: Every day | ORAL | Status: DC
  Administered 2012-06-23 – 2012-06-24 (×2): 1 via ORAL
  Filled 2012-06-22 (×3): qty 1

## 2012-06-22 MED ORDER — NITROGLYCERIN 0.4 MG SL SUBL: 0.4000 mg | SUBLINGUAL_TABLET | SUBLINGUAL | Status: DC | PRN

## 2012-06-22 MED ORDER — SODIUM CHLORIDE 0.9 % IV SOLN
250.0000 mL | INTRAVENOUS | Status: DC | PRN
  Administered 2012-06-23: 250 mL via INTRAVENOUS

## 2012-06-22 NOTE — ED Provider Notes (Signed)
History     CSN: [id number]  Arrival date & time 06/22/12  1150   First MD Initiated Contact with Patient 06/22/12 1208      Chief Complaint  Patient presents with  . Chest Pain  . Weakness    (Consider location/radiation/quality/duration/timing/severity/associated sxs/prior treatment) HPI  Patient complaining of light headedness and heaviness across shoulders began this am on awakening about 0830.  Describes pain as from shoulder blade to shoulder blade and heavy.  Denies chest pain.  Mild dyspnea, but generally feels weak and tired.  Patient denies any history of same.  Patient states she has seen a cardiologist at [facility] in past for unknown reason but has not seen them on a regular basis.   Past Medical History  Diagnosis Date  . Hypertension   . Heart murmur     Past Surgical History  Procedure Laterality Date  . Abdominal hysterectomy    . Tonsillectomy    . Cesarean section      Family History  Problem Relation Age of Onset  . Heart failure Father     History  Substance Use Topics  . Smoking status: Never Smoker   . Smokeless tobacco: Not on file  . Alcohol Use: No    OB History   Grav Para Term Preterm Abortions TAB SAB Ect Mult Living                  Review of Systems  All other systems reviewed and are negative.    Allergies  Codeine and Sulfonamide derivatives  Home Medications   Current Outpatient Rx  Name  Route  Sig  Dispense  Refill  . amLODipine (NORVASC) 5 MG tablet               . PREVACID SOLUTAB 30 MG disintegrating tablet               . zolpidem (AMBIEN) 10 MG tablet                 BP 102/83  Pulse 109  Temp(Src) 98.1 F (36.7 C) (Oral)  Resp 18  SpO2 98%  Physical Exam  Nursing note and vitals reviewed. Constitutional: She is oriented to person, place, and time. She appears well-developed and well-nourished.  HENT:  Head: Normocephalic and atraumatic.  Right Ear: External ear normal.  Left Ear:  External ear normal.  Nose: Nose normal.  Mouth/Throat: Oropharynx is clear and moist.  Eyes: Conjunctivae are normal. Pupils are equal, round, and reactive to light.  Neck: Normal range of motion. Neck supple.  Cardiovascular: An irregularly irregular rhythm present. Tachycardia present.   Pulmonary/Chest:  Crackle right base  Abdominal: Soft. Bowel sounds are normal.  Musculoskeletal: Normal range of motion. She exhibits no edema and no tenderness.  Neurological: She is alert and oriented to person, place, and time.  Skin: Skin is warm and dry.  Psychiatric: She has a normal mood and affect. Her behavior is normal. Thought content normal.    ED Course  Procedures (including critical care time)  Labs Reviewed - No data to display No results found.   No diagnosis found.  Date: 06/22/2012  Rate: 127  Rhythm: atrial fibrillation  QRS Axis: normal  Intervals: normal  ST/T Wave abnormalities: ST depressions diffusely  Conduction Disutrbances:a fib  Narrative Interpretation:   Old EKG Reviewed: none available  Results for orders placed during the hospital encounter of 06/22/12  CBC      Result Value Range  WBC 7.3  4.0 - 10.5 K/uL   RBC 3.69 (*) 3.87 - 5.11 MIL/uL   Hemoglobin 10.7 (*) 12.0 - 15.0 g/dL   HCT 29.5 (*) 62.1 - 30.8 %   MCV 87.3  78.0 - 100.0 fL   MCH 29.0  26.0 - 34.0 pg   MCHC 33.2  30.0 - 36.0 g/dL   RDW 65.7  84.6 - 96.2 %   Platelets 184  150 - 400 K/uL  BASIC METABOLIC PANEL      Result Value Range   Sodium 140  135 - 145 mEq/L   Potassium 3.8  3.5 - 5.1 mEq/L   Chloride 103  96 - 112 mEq/L   CO2 23  19 - 32 mEq/L   Glucose, Bld 118 (*) 70 - 99 mg/dL   BUN 25 (*) 6 - 23 mg/dL   Creatinine, Ser 9.52  0.50 - 1.10 mg/dL   Calcium 9.0  8.4 - 84.1 mg/dL   GFR calc non Af Amer 53 (*) >90 mL/min   GFR calc Af Amer 61 (*) >90 mL/min  PROTIME-INR      Result Value Range   Prothrombin Time 13.7  11.6 - 15.2 seconds   INR 1.06  0.00 - 1.49  PRO B  NATRIURETIC PEPTIDE      Result Value Range   Pro B Natriuretic peptide (BNP) 6460.0 (*) 0 - 450 pg/mL  POCT I-STAT TROPONIN I      Result Value Range   Troponin i, poc 0.06  0.00 - 0.08 ng/mL   Comment 3             Dg Chest Port 1 View  06/22/2012   *RADIOLOGY REPORT*  Clinical Data: Shortness of breath.  Hypertension.  PORTABLE CHEST - 1 VIEW  Comparison: None.  Findings: Midline trachea.  Cardiomegaly accentuated by AP portable technique.  No pleural effusion or pneumothorax. Minimal right cardiophrenic angle blunting is likely due to a prominent epicardial fat pad.  Biapical pleural thickening. No congestive failure.  Favor summation shadow over the right mid lung due to overlap of ribs.  IMPRESSION: Cardiomegaly, without acute disease or congestive failure.  Probable summation shadow projecting over the right midlung.  PA and lateral films could be performed to exclude pulmonary nodule.   Original Report Authenticated By: Jeronimo Greaves, M.D.   I have reviewed the report and personally reviewed the above radiology studies.    MDM  Orders entered for a fib and rate control.  12:39 PM   Patient with hr decreased to 110 after cardizem bolus.  Patient rechecked and had second bolus and on cardizem drip at 10 / hour with hr 80-90.  States feels better with no shoulder discomfort and weakness better.   Discussed with Trish on for [facility] and they will see.   Filed Vitals:   06/22/12 1245  BP: 136/86  Pulse: 144  Temp:   Resp: 17   CRITICAL CARE Performed by: Uri Turnbough S Total critical care time: 40 Critical care time was exclusive of separately billable procedures and treating other patients. Critical care was necessary to treat or prevent imminent or life-threatening deterioration. Critical care was time spent personally by me on the following activities: development of treatment plan with patient and/or surrogate as well as nursing, discussions with consultants, evaluation of  patient's response to treatment, examination of patient, obtaining history from patient or surrogate, ordering and performing treatments and interventions, ordering and review of laboratory studies, ordering and review of radiographic  studies, pulse oximetry and re-evaluation of patient's condition.   Hilario Quarry, MD 06/22/12 [phone]

## 2012-06-22 NOTE — ED Notes (Signed)
Pt denies any cp, nausea, vomiting, or sob. Pt states she just felt really tired when she woke up this morning and had some dizziness, this was the first time this happened.

## 2012-06-22 NOTE — Progress Notes (Signed)
Patient c/o pain at IV site.  RN assessed and removed IV. Heat pad applied.  New IV to be started.  RN will continue to monitor. Louretta Parma, RN

## 2012-06-22 NOTE — ED Notes (Cosign Needed)
Pt came from pcp with c/o generalized chest heaviness, weakness, and dizziness. Pt states she woke up this morning with a general feeling of weakness, and like she had a weight on her shoulder. Pt was seen at Dr. Kizzie Ide office and he called ems. Pt was diaphoretic on arrival was given 324 asa, and 1 nitro and is now pain free. Pt was showing atrial fibrillation on monitor, pt states she does not have a hx of this. Pt has a hx of Hear murmur and HTN. HR between 96-155, 18g RAC.

## 2012-06-22 NOTE — H&P (Signed)
Patient ID: Anna Elliott MRN: [id number], DOB/AGE: 08-31-1921   Admit date: 06/22/2012   Primary Physician: Josue Hector, MD Primary Cardiologist: New.  F/U in [place]  Pt. Profile:  79 y/o female with h/o palpitations and AS who presented to the ED this AM with tightness across her shoulder blades, lightheadedness, and rapid afib.  Problem List  Past Medical History  Diagnosis Date  . Hypertension   . Moderate aortic stenosis     a. 10/2009 Echo: EF 60%, mod LVH w/ signif upper septal thickening, Mos AS (VA 1.07cm^2 VTI, 1.03cm^2 Vmax), mod dil LA, mildly dil RA.  Marland Kitchen Chronic anemia     a. dating back to 2002 - BM bx: early mild dysplasia and/or sideroblastic changes.  Marland Kitchen Dysphagia   . GERD (gastroesophageal reflux disease)   . Esophagitis   . Hiatal hernia   . Macular degeneration   . H/O: rheumatic fever     a. as a child    Past Surgical History  Procedure Laterality Date  . Abdominal hysterectomy    . Tonsillectomy    . Cesarean section      Allergies  Allergies  Allergen Reactions  . Codeine Other (See Comments)    Makes her sick  . Sulfonamide Derivatives Itching and Rash   HPI  79 y/o female with the above problem list.  She reports that many years ago she was evaluated by cardiology for palpitations.  A stress test was apparently done and was normal.  Over the years, she has had periodic palpitations, lasting a few seconds and resolving spontaneously.  Due to a heart murmur, she had an echo in 2011, ordered by her PCP.  This showed moderate AS with nl LV fxn and a moderately dilated LA.  She lives with her dtr and son-in-law in [place] and generally does pretty well for herself.  She does not routinely exercise but is capable of carrying out chores and occasionally goes for walks at the mall.    This morning, she awoke at about 8:30 and shortly after getting up and she began to experience tightness across her upper back associated with marked  presyncope/lightheadedness.  She did not have dyspnea, chest pain, or palpitations.  She alerted her family who called her PCP and then she was driven to her PCP's office for evaluation.  There, she was found to be in afib with rvr and was treated with ASA.  EMS was called and following their arrival, she was given sl ntg w/o relief of Ss.  She was then taken to the [facility] ED where afib persisted into the 130's.  She was treated with IV dilt (10mg  bolus followed by gtt @ 5mg /hr) with reduction in rates into the low 100's and is has had resolution of back discomfort/presyncope.  ECG shows inferolateral ST depression and initial poc Troponin is nl.  Though she has a h/o palpitations she has not felt them at any point this AM, thus true duration of afib is unknown.  Prior to her visit this AM, she was last seen by her PCP about 3 wks ago and their was no mention of tachycardia as far as she knows.  Home Medications  Prior to Admission medications   Medication Sig Start Date End Date Taking? Authorizing Provider  amLODipine (NORVASC) 5 MG tablet 5 mg daily.  09/09/11  Yes Historical Provider, MD  Calcium Carbonate-Vitamin D (CALCIUM + D PO) Take 2 tablets by mouth 2 (two) times daily.   Yes Historical  Provider, MD  cyanocobalamin 500 MCG tablet Take 500 mcg by mouth daily.   Yes Historical Provider, MD  Multiple Vitamins-Minerals (EYE VITAMINS PO) Take 1 tablet by mouth daily.   Yes Historical Provider, MD  PREVACID SOLUTAB 30 MG disintegrating tablet 1 mg daily.  08/11/11  Yes Historical Provider, MD  zolpidem (AMBIEN) 10 MG tablet 2.5 mg at bedtime as needed for sleep.  09/14/11  Yes Historical Provider, MD   Family History  Family History  Problem Relation Age of Onset  . Heart failure Father   . Heart disease Brother   . Diabetes Father   . Colon cancer Brother   . Lung cancer Sister   . Lymphoma Sister    Social History  History   Social History  . Marital Status: Widowed    Spouse Name:  N/A    Number of Children: N/A  . Years of Education: N/A   Occupational History  . Not on file.   Social History Main Topics  . Smoking status: Never Smoker   . Smokeless tobacco: Not on file  . Alcohol Use: No  . Drug Use: No  . Sexually Active: Not on file   Other Topics Concern  . Not on file   Social History Narrative   Lives in [place] with her dtr and son-in-law.  She is active around the house and occasionally goes for walks at the mall.  No h/o falls though she notes that her macular degeneration limits her vision somewhat and she is prone to losing her balance on inclines.    Review of Systems General:  No chills, fever, night sweats or weight changes.  Cardiovascular:  +++ midscapular back tightness and presyncope.  No chest pain, dyspnea on exertion, edema, orthopnea, palpitations, paroxysmal nocturnal dyspnea. Dermatological: No rash, lesions/masses Respiratory: No cough, dyspnea Urologic: No hematuria, dysuria Abdominal:   No nausea, vomiting, diarrhea, bright red blood per rectum, melena, or hematemesis Neurologic:  No visual changes, +++ wkns in setting of Ss this AM.  No changes in mental status. All other systems reviewed and are otherwise negative except as noted above.  Physical Exam  Blood pressure 86/56, pulse 106, temperature 98.1 F (36.7 C), temperature source Oral, resp. rate 14, SpO2 94.00%.  General: Pleasant, NAD Psych: Normal affect. Neuro: Alert and oriented X 3. Moves all extremities spontaneously. HEENT: Normal  Neck: Supple, no jvd, radiated murmur bilat. Lungs:  Resp regular and unlabored, bibasilar crackles, otw clear. Heart: IR IR, tachy, no s3, s4, 2/6 SEM throughout, loudest @ llsb, radiates to bilat carotids, R>L. Abdomen: Soft, non-tender, non-distended, BS + x 4.  Extremities: No clubbing, cyanosis or edema. DP/PT/Radials 2+ and equal bilaterally.  Labs  Trop i, poc 0.06  Lab Results  Component Value Date   WBC 7.3 06/22/2012     HGB 10.7* 06/22/2012   HCT 32.2* 06/22/2012   MCV 87.3 06/22/2012   PLT 184 06/22/2012     Recent Labs Lab 06/22/12 1300  NA 140  K 3.8  CL 103  CO2 23  BUN 25*  CREATININE 0.93  CALCIUM 9.0  GLUCOSE 118*   Radiology/Studies  Dg Chest Port 1 View  06/22/2012   *RADIOLOGY REPORT*  Clinical Data: Shortness of breath.  Hypertension.  PORTABLE CHEST - 1 VIEW  Comparison: None.  Findings: Midline trachea.  Cardiomegaly accentuated by AP portable technique.  No pleural effusion or pneumothorax. Minimal right cardiophrenic angle blunting is likely due to a prominent epicardial fat pad.  Biapical  pleural thickening. No congestive failure.  Favor summation shadow over the right mid lung due to overlap of ribs.  IMPRESSION: Cardiomegaly, without acute disease or congestive failure.  Probable summation shadow projecting over the right midlung.  PA and lateral films could be performed to exclude pulmonary nodule.   Original Report Authenticated By: Jeronimo Greaves, M.D.   ECG  Afib, 127, inflat st depression.  ASSESSMENT AND PLAN  1.  Afib RVR:  Exact duration unknown though symptoms or retroscapular back discomfort began this AM at appromimately 8:40.  Rate is currently better with IV diltiazem and Ss have resolved.  Initial troponin is nl, though ECG shows inferolateral ST depression, thus given prolonged duration of Ss, would not be surprised if she rules in for NSTEMI.  Cont IV dilt and heparin for now.  Add oral bb as bp allows.  CHA2DS2VASc = 4, thus we will have to consider long term anticoagulation once we've determined whether or not she will require an invasive w/u.  She does have a h/o GERD/Esophagitis and reportedly does not tolerate aspirin well.  We will need to keep this in mind in choosing an oral anticoagulant.  Check echo, TSH, Mg.  2.  [place]:  In setting of above with ST changes on ECG.   F/U ECG now that rate is slower.  She is currently pain free and initial troponin is nl.  Cycle CE  and add heparin for now.  If she rules in for NSTEMI, we will plan to pursue diagnostic cath in the AM.  Add low-dose asa (reports prev GI intolerance but tolerated 325 that she received in PCP's office today), statin, and bb as bp tolerates.  3.  HTN:  Stable to low currently.  4.  Lipids:  Check lipids/lft's.  Add statin in setting of #2.  5.  Anemia:  Chronic and previously worked up by [employer].  Stable.  Follow with anticoagulation.  6.  Gerd/Esophagitis:  She apparently only tolerates prevacid melts.  Signed, Nicolasa Ducking, NP 06/22/2012, 3:46 PM Patient was seen in the emergency room with Mr. Brion Aliment, NP.  I agree with evaluation and plans as noted above.  Physical examination is remarkable for mild pallor and for bibasilar fine inspiratory rales at the lung bases.  She has a systolic heart murmur at base consistent with known aortic stenosis and we will update her echocardiogram. Her symptoms of heavy weight and pressure across her shoulders is suggestive of ischemia and we will await serial enzymes and EKGs.  If she rules in we will plan for cardiac catheterization tomorrow, and patient and family are agreeable with this.  We will keep n.p.o. in the morning until decision regarding cardiac catheterization is made. We will use IV heparin and low-dose intravenous diltiazem for her atrial fibrillation which appears to be of recent onset.  She has a past history of gastric intolerance to aspirin dating back to her history of rheumatic fever in childhood when she states that her stomach lining was "burned" from high-dose aspirin.  She states that the only PPI which works for her is the dissolving form of Prevacid.

## 2012-06-22 NOTE — Progress Notes (Signed)
ANTICOAGULATION CONSULT NOTE - Initial Consult  Pharmacy Consult for Heparin Indication: Afib; USAP  Allergies  Allergen Reactions  . Codeine Other (See Comments)    Makes her sick  . Sulfonamide Derivatives Itching and Rash    Patient Measurements: Height: 5' 4.96" (165 cm) Weight: 138 lb 10.7 oz (62.9 kg) IBW/kg (Calculated) : 56.91 Heparin Dosing Weight: 63kg  Vital Signs: Temp: 98.1 F (36.7 C) (05/28 1205) Temp src: Oral (05/28 1205) BP: 86/56 mmHg (05/28 1431) Pulse Rate: 66 (05/28 1400)  Labs:  Recent Labs  06/22/12 1300  HGB 10.7*  HCT 32.2*  PLT 184  LABPROT 13.7  INR 1.06  CREATININE 0.93    Estimated Creatinine Clearance: 36.8 ml/min (by C-G formula based on Cr of 0.93).   Medical History: Past Medical History  Diagnosis Date  . Hypertension   . Moderate aortic stenosis     a. 10/2009 Echo: EF 60%, mod LVH w/ signif upper septal thickening, Mos AS (VA 1.07cm^2 VTI, 1.03cm^2 Vmax), mod dil LA, mildly dil RA.  Marland Kitchen Chronic anemia     a. dating back to 2002 - BM bx: early mild dysplasia and/or sideroblastic changes.  Marland Kitchen Dysphagia   . GERD (gastroesophageal reflux disease)   . Esophagitis   . Hiatal hernia   . Macular degeneration   . H/O: rheumatic fever     a. as a child    Medications:   (Not in a hospital admission)  Assessment: 79 y/o female patient admitted with palpitations and chest pain requiring anticoagulation for r/o MI and afib. EKG shows some ST depression and afib, trop negative x1.  Goal of Therapy:  Heparin level 0.3-0.7 units/ml Monitor platelets by anticoagulation protocol: Yes   Plan:  Heparin 3500 unit IV bolus followed by infusion at 950 units/hr. Check 8 hour heparin level with daily cbc and heparin level.   Verlene Mayer, PharmD, BCPS Pager [phone] 06/22/2012,4:47 PM

## 2012-06-23 DIAGNOSIS — I359 Nonrheumatic aortic valve disorder, unspecified: Secondary | ICD-10-CM

## 2012-06-23 LAB — TROPONIN I: Troponin I: <0.3 ng/mL (ref <0.30)

## 2012-06-23 LAB — LIPID PANEL
Cholesterol: 132 mg/dL (ref 0–200)
HDL: 41 mg/dL (ref >39)
Total CHOL/HDL Ratio: 3.2 RATIO
Triglycerides: 100 mg/dL (ref <150)
VLDL: 20 mg/dL (ref 0–40)

## 2012-06-23 LAB — CBC
Hemoglobin: 9.6 g/dL — ABNORMAL LOW (ref 12.0–15.0)
Platelets: 173 10*3/uL (ref 150–400)
RBC: 3.31 MIL/uL — ABNORMAL LOW (ref 3.87–5.11)
WBC: 5.7 10*3/uL (ref 4.0–10.5)

## 2012-06-23 LAB — COMPREHENSIVE METABOLIC PANEL
CO2: 25 mEq/L (ref 19–32)
Calcium: 8.6 mg/dL (ref 8.4–10.5)
Creatinine, Ser: 0.85 mg/dL (ref 0.50–1.10)
GFR calc Af Amer: 68 mL/min — ABNORMAL LOW (ref >90)
GFR calc non Af Amer: 59 mL/min — ABNORMAL LOW (ref >90)
Glucose, Bld: 111 mg/dL — ABNORMAL HIGH (ref 70–99)
Sodium: 141 mEq/L (ref 135–145)
Total Protein: 5.6 g/dL — ABNORMAL LOW (ref 6.0–8.3)

## 2012-06-23 MED ORDER — DILTIAZEM HCL 30 MG PO TABS
30.0000 mg | ORAL_TABLET | Freq: Four times a day (QID) | ORAL | Status: DC
  Administered 2012-06-23 – 2012-06-24 (×4): 30 mg via ORAL
  Filled 2012-06-23 (×8): qty 1

## 2012-06-23 MED ORDER — LANSOPRAZOLE 15 MG PO TBDP: 30.0000 mg | ORAL_TABLET | Freq: Every day | ORAL | Status: DC

## 2012-06-23 MED ORDER — ESOMEPRAZOLE MAGNESIUM 40 MG PO CPDR
40.0000 mg | DELAYED_RELEASE_CAPSULE | Freq: Every day | ORAL | Status: DC
  Filled 2012-06-23: qty 1

## 2012-06-23 MED ORDER — HEPARIN BOLUS VIA INFUSION
2000.0000 [IU] | Freq: Once | INTRAVENOUS | Status: AC
  Administered 2012-06-23: 2000 [IU] via INTRAVENOUS
  Filled 2012-06-23: qty 2000

## 2012-06-23 MED ORDER — LANSOPRAZOLE 30 MG PO TBDP
30.0000 mg | ORAL_TABLET | Freq: Every day | ORAL | Status: DC
  Administered 2012-06-24: 30 mg via ORAL

## 2012-06-23 NOTE — Progress Notes (Signed)
UR COMPLETED  

## 2012-06-23 NOTE — Progress Notes (Signed)
ANTICOAGULATION CONSULT NOTE - Follow Up Consult  Pharmacy Consult for heparin Indication: atrial fibrillation and USAP  Labs:  Recent Labs  06/22/12 1300 06/22/12 1801 06/22/12 2320 06/23/12 0138  HGB 10.7*  --   --   --   HCT 32.2*  --   --   --   PLT 184  --   --   --   LABPROT 13.7  --   --   --   INR 1.06  --   --   --   HEPARINUNFRC  --   --   --  0.19*  CREATININE 0.93  --   --   --   TROPONINI  --  <0.30 <0.30  --     Assessment: 79yo female subtherapeutic on heparin with initial dosing for Afib/USAP though IV had infiltrated and gtt off x1hr, restarted about 3hr prior to lab draw.  Goal of Therapy:  Heparin level 0.3-0.7 units/ml   Plan:  Will rebolus with heparin 2000 units x1 and increase gtt slightly to 1000 units/hr and recheck level in 8hr.  Vernard Gambles, PharmD, BCPS  06/23/2012,2:26 AM

## 2012-06-23 NOTE — Progress Notes (Signed)
*  PRELIMINARY RESULTS* Echocardiogram 2D Echocardiogram has been performed.  Jeryl Columbia 06/23/2012, 9:02 AM

## 2012-06-23 NOTE — Progress Notes (Signed)
ANTICOAGULATION CONSULT NOTE - Follow Up Consult  Pharmacy Consult for heparin Indication: atrial fibrillation and USAP  Labs:  Recent Labs  06/22/12 1300 06/22/12 1801 06/22/12 2320 06/23/12 0138 06/23/12 0540 06/23/12 0910 06/23/12 1846  HGB 10.7*  --   --   --  9.6*  --   --   HCT 32.2*  --   --   --  29.2*  --   --   PLT 184  --   --   --  173  --   --   LABPROT 13.7  --   --   --   --   --   --   INR 1.06  --   --   --   --   --   --   HEPARINUNFRC  --   --   --  0.19*  --  0.84* 0.65  CREATININE 0.93  --   --   --  0.85  --   --   TROPONINI  --  <0.30 <0.30  --  <0.30  --   --     Assessment: 79yo female therapeutic on heparin for Afib/USAP.  Heparin level = 0.65 after heparin rate decreased to 850 units/hr.  No bleeding reported.  CHADs2-VASc score of 4 and HAS_BLED score of 2.  MD has recommended eliquis.   Goal of Therapy:  Heparin level 0.3-0.7 units/ml   Plan:  1) continue heparin at 850 units / hr 2) daily HL/CBC 3) if pt chooses eliquis, please consider dose of 2.5 mg po BID (due to age 15 and wt 61 kg (close to 60 kg cut off) Herby Abraham, Pharm.D. [phone] 06/23/2012 8:19 PM

## 2012-06-23 NOTE — Progress Notes (Signed)
ANTICOAGULATION CONSULT NOTE - Follow Up Consult  Pharmacy Consult for heparin Indication: atrial fibrillation and USAP  Labs:  Recent Labs  06/22/12 1300 06/22/12 1801 06/22/12 2320 06/23/12 0138 06/23/12 0540 06/23/12 0910  HGB 10.7*  --   --   --  9.6*  --   HCT 32.2*  --   --   --  29.2*  --   PLT 184  --   --   --  173  --   LABPROT 13.7  --   --   --   --   --   INR 1.06  --   --   --   --   --   HEPARINUNFRC  --   --   --  0.19*  --  0.84*  CREATININE 0.93  --   --   --  0.85  --   TROPONINI  --  <0.30 <0.30  --  <0.30  --     Assessment: 79yo female subtherapeutic on heparin with initial dosing for Afib/USAP  Heparin level = 0.84, supra-therapeutic  Goal of Therapy:  Heparin level 0.3-0.7 units/ml   Plan:  1) Decrease heparin to 850 units / hr 2) Heparin level 8 hours after heparin decreased  Thank you. Okey Regal, PharmD [phone]  06/23/2012,10:31 AM

## 2012-06-23 NOTE — Progress Notes (Signed)
SUBJECTIVE:  She is in no distress and wants to go home.  No pain.  No SOB.   PHYSICAL EXAM Filed Vitals:   06/22/12 2139 06/22/12 2235 06/22/12 2312 06/23/12 0510  BP: 131/62   110/68  Pulse: 74 89 84 87  Temp: 97 F (36.1 C)   98.2 F (36.8 C)  TempSrc: Oral   Oral  Resp: 20   20  Height:      Weight:    135 lb 9.6 oz (61.508 kg)  SpO2: 98%   94%   General:  No distress HEENT:  PERRL Lungs:  Clear Heart: Irregular Abdomen:  Positive bowel sounds, no rebound no guarding Extremities:  No edema Neuro:  Nonfocal.  LABS: Lab Results  Component Value Date   TROPONINI <0.30 06/23/2012   Results for orders placed during the hospital encounter of 06/22/12 (from the past 24 hour(s))  CBC     Status: Abnormal   Collection Time    06/22/12  1:00 PM      Result Value Range   WBC 7.3  4.0 - 10.5 K/uL   RBC 3.69 (*) 3.87 - 5.11 MIL/uL   Hemoglobin 10.7 (*) 12.0 - 15.0 g/dL   HCT 16.1 (*) 09.6 - 04.5 %   MCV 87.3  78.0 - 100.0 fL   MCH 29.0  26.0 - 34.0 pg   MCHC 33.2  30.0 - 36.0 g/dL   RDW 40.9  81.1 - 91.4 %   Platelets 184  150 - 400 K/uL  BASIC METABOLIC PANEL     Status: Abnormal   Collection Time    06/22/12  1:00 PM      Result Value Range   Sodium 140  135 - 145 mEq/L   Potassium 3.8  3.5 - 5.1 mEq/L   Chloride 103  96 - 112 mEq/L   CO2 23  19 - 32 mEq/L   Glucose, Bld 118 (*) 70 - 99 mg/dL   BUN 25 (*) 6 - 23 mg/dL   Creatinine, Ser 7.82  0.50 - 1.10 mg/dL   Calcium 9.0  8.4 - 95.6 mg/dL   GFR calc non Af Amer 53 (*) >90 mL/min   GFR calc Af Amer 61 (*) >90 mL/min  PROTIME-INR     Status: None   Collection Time    06/22/12  1:00 PM      Result Value Range   Prothrombin Time 13.7  11.6 - 15.2 seconds   INR 1.06  0.00 - 1.49  PRO B NATRIURETIC PEPTIDE     Status: Abnormal   Collection Time    06/22/12  1:00 PM      Result Value Range   Pro B Natriuretic peptide (BNP) 6460.0 (*) 0 - 450 pg/mL  TSH     Status: None   Collection Time    06/22/12  1:00  PM      Result Value Range   TSH 1.607  0.350 - 4.500 uIU/mL  POCT I-STAT TROPONIN I     Status: None   Collection Time    06/22/12  1:19 PM      Result Value Range   Troponin i, poc 0.06  0.00 - 0.08 ng/mL   Comment 3           TROPONIN I     Status: None   Collection Time    06/22/12  6:01 PM      Result Value Range   Troponin I <0.30  <0.30  ng/mL  MAGNESIUM     Status: None   Collection Time    06/22/12  6:01 PM      Result Value Range   Magnesium 2.1  1.5 - 2.5 mg/dL  TROPONIN I     Status: None   Collection Time    06/22/12 11:20 PM      Result Value Range   Troponin I <0.30  <0.30 ng/mL  HEPARIN LEVEL (UNFRACTIONATED)     Status: Abnormal   Collection Time    06/23/12  1:38 AM      Result Value Range   Heparin Unfractionated 0.19 (*) 0.30 - 0.70 IU/mL  TROPONIN I     Status: None   Collection Time    06/23/12  5:40 AM      Result Value Range   Troponin I <0.30  <0.30 ng/mL  COMPREHENSIVE METABOLIC PANEL     Status: Abnormal   Collection Time    06/23/12  5:40 AM      Result Value Range   Sodium 141  135 - 145 mEq/L   Potassium 3.8  3.5 - 5.1 mEq/L   Chloride 106  96 - 112 mEq/L   CO2 25  19 - 32 mEq/L   Glucose, Bld 111 (*) 70 - 99 mg/dL   BUN 18  6 - 23 mg/dL   Creatinine, Ser 4.54  0.50 - 1.10 mg/dL   Calcium 8.6  8.4 - 09.8 mg/dL   Total Protein 5.6 (*) 6.0 - 8.3 g/dL   Albumin 3.5  3.5 - 5.2 g/dL   AST 16  0 - 37 U/L   ALT 12  0 - 35 U/L   Alkaline Phosphatase 52  39 - 117 U/L   Total Bilirubin 0.9  0.3 - 1.2 mg/dL   GFR calc non Af Amer 59 (*) >90 mL/min   GFR calc Af Amer 68 (*) >90 mL/min  CBC     Status: Abnormal   Collection Time    06/23/12  5:40 AM      Result Value Range   WBC 5.7  4.0 - 10.5 K/uL   RBC 3.31 (*) 3.87 - 5.11 MIL/uL   Hemoglobin 9.6 (*) 12.0 - 15.0 g/dL   HCT 11.9 (*) 14.7 - 82.9 %   MCV 88.2  78.0 - 100.0 fL   MCH 29.0  26.0 - 34.0 pg   MCHC 32.9  30.0 - 36.0 g/dL   RDW 56.2 (*) 13.0 - 86.5 %   Platelets 173  150 -  400 K/uL  LIPID PANEL     Status: None   Collection Time    06/23/12  5:40 AM      Result Value Range   Cholesterol 132  0 - 200 mg/dL   Triglycerides 784  <696 mg/dL   HDL 41  >29 mg/dL   Total CHOL/HDL Ratio 3.2     VLDL 20  0 - 40 mg/dL   LDL Cholesterol 71  0 - 99 mg/dL    Intake/Output Summary (Last 24 hours) at 06/23/12 0848 Last data filed at 06/23/12 0529  Gross per 24 hour  Intake 518.22 ml  Output    350 ml  Net 168.22 ml    EKG:  Atrial fib rate 85, no acute ST T wave changes.    ASSESSMENT AND PLAN:  Atrial fibrillation:  I had a long discussion with the patient and her daughter today.  They would like to think about starting a blood thinner (  I have suggested Eliquis).  [name] has a CHA2DS2 - VASc score of 4 with a risk of stroke of 4%  and a HAS - BLED score of 2 with a low risk of bleeding.  They are concerned because of the anemia history which I have explained is not a history bleeding or blood loss but lack of production.  They will think about it.  They might want an apppt with the hematologist first.  We need to address this again in the AM.  I will stop the IV Cardizem and start PO.    BACK PAIN:  No objective evidence of ischemia.  Negative enzymes.  No further ischemia work up. Of note I looked at the echo (no final reading yet).  She has a hyperdynamic LV function. AS is not severe.  ANEMIA, CHRONIC:  As above  HYPERTENSION:  BP OK this am.   GERD:  She has no active symptoms and no bleeding    Moderate aortic stenosis:  No further work up.     Fayrene Fearing [facility] 06/23/2012 8:48 AM

## 2012-06-24 ENCOUNTER — Encounter (HOSPITAL_COMMUNITY): Payer: Self-pay | Admitting: Physician Assistant

## 2012-06-24 LAB — CBC
HCT: 29.9 % — ABNORMAL LOW (ref 36.0–46.0)
Hemoglobin: 9.8 g/dL — ABNORMAL LOW (ref 12.0–15.0)
MCH: 29.6 pg (ref 26.0–34.0)
MCV: 90.3 fL (ref 78.0–100.0)
RBC: 3.31 MIL/uL — ABNORMAL LOW (ref 3.87–5.11)

## 2012-06-24 LAB — HEPARIN LEVEL (UNFRACTIONATED): Heparin Unfractionated: 0.59 IU/mL (ref 0.30–0.70)

## 2012-06-24 MED ORDER — APIXABAN 2.5 MG PO TABS
2.5000 mg | ORAL_TABLET | Freq: Two times a day (BID) | ORAL | Status: DC
  Administered 2012-06-24: 2.5 mg via ORAL
  Filled 2012-06-24 (×2): qty 1

## 2012-06-24 MED ORDER — DILTIAZEM HCL ER COATED BEADS 120 MG PO CP24: 120.0000 mg | ORAL_CAPSULE | Freq: Every day | ORAL | Status: DC

## 2012-06-24 MED ORDER — DILTIAZEM HCL ER COATED BEADS 120 MG PO CP24
120.0000 mg | ORAL_CAPSULE | Freq: Every day | ORAL | Status: DC
  Administered 2012-06-24: 120 mg via ORAL
  Filled 2012-06-24: qty 1

## 2012-06-24 MED ORDER — APIXABAN 2.5 MG PO TABS: 2.5000 mg | ORAL_TABLET | Freq: Two times a day (BID) | ORAL | Status: DC

## 2012-06-24 NOTE — Progress Notes (Signed)
Met with patient at bedside to explain [facility] Care Management services as a benefit of her [place] [employer] insurance. Patient reports she is not interested in services at this time. Left [facility] Care Management brochure for her to call in the future if she should change her mind. Raiford Noble, MSN- Ed, RN,BSN, [facility], [phone]

## 2012-06-24 NOTE — Progress Notes (Signed)
   Subjective:  Still in atrial fib. No symptoms. Has ruled out for MI  Objective:  Vital Signs in the last 24 hours: Temp:  [97.6 F (36.4 C)-98.3 F (36.8 C)] 98.3 F (36.8 C) (05/30 0512) Pulse Rate:  [57-78] 70 (05/30 0512) Resp:  [18-20] 19 (05/30 0512) BP: (109-128)/(50-77) 109/62 mmHg (05/30 0512) SpO2:  [95 %-100 %] 95 % (05/30 0512) Weight:  [136 lb (61.689 kg)] 136 lb (61.689 kg) (05/30 0512)  Intake/Output from previous day: 05/29 0701 - 05/30 0700 In: 681.7 [P.O.:460; I.V.:221.7] Out: 300 [Urine:300] Intake/Output from this shift: Total I/O In: 240 [P.O.:240] Out: -   . aspirin EC  81 mg Oral Daily  . cyanocobalamin  500 mcg Oral Daily  . diltiazem  120 mg Oral Daily  . lansoprazole  30 mg Oral QAC breakfast  . multivitamin-lutein  1 capsule Oral Daily  . sodium chloride  3 mL Intravenous Q12H      Physical Exam: The patient appears to be in no distress.  Head and neck exam reveals that the pupils are equal and reactive.  The extraocular movements are full.  There is no scleral icterus.  Mouth and pharynx are benign.  No lymphadenopathy.  No carotid bruits.  The jugular venous pressure is normal.  Thyroid is not enlarged or tender.  Chest is clear to percussion and auscultation.  No rales or rhonchi.  Expansion of the chest is symmetrical.  Heart reveals grade2/6 AS murmur  The abdomen is soft and nontender.  Bowel sounds are normoactive.  There is no hepatosplenomegaly or mass.  There are no abdominal bruits.  Extremities reveal no phlebitis or edema.  Pedal pulses are good.  There is no cyanosis or clubbing.  Neurologic exam is normal strength and no lateralizing weakness.  No sensory deficits.  Integument reveals no rash  Lab Results:  Recent Labs  06/23/12 0540 06/24/12 0530  WBC 5.7 5.7  HGB 9.6* 9.8*  PLT 173 180    Recent Labs  06/22/12 1300 06/23/12 0540  NA 140 141  K 3.8 3.8  CL 103 106  CO2 23 25  GLUCOSE 118* 111*  BUN  25* 18  CREATININE 0.93 0.85    Recent Labs  06/22/12 2320 06/23/12 0540  TROPONINI <0.30 <0.30   Hepatic Function Panel  Recent Labs  06/23/12 0540  PROT 5.6*  ALBUMIN 3.5  AST 16  ALT 12  ALKPHOS 52  BILITOT 0.9    Recent Labs  06/23/12 0540  CHOL 132   No results found for this basename: PROTIME,  in the last 72 hours  Imaging: Imaging results have been reviewed  Cardiac Studies: Telemetry shows AF with controlled VR Assessment/Plan:  1. Atrial fib 2. Moderate AS  Plan: home today on Eliquis and cardizem CD.  No norvasc for now. Family to decide whether they want followup in [place] or in [place].   LOS: 2 days    [name] 06/24/2012, 8:43 AM

## 2012-06-24 NOTE — Discharge Summary (Addendum)
Discharge Summary   Patient ID: Anna Elliott MRN: [id number], DOB/AGE: 1921/08/10 79 y.o. Admit date: 06/22/2012 D/C date:     06/24/2012  Primary Cardiologist: Hochrein - no immediate post-hospital availability in [place], but hopefully future appointments will be made here  Primary Discharge Diagnoses:  1. Atrial fibrillation with RVR, newly diagnosed, unclear duration - started on dilitazem, Eliquis 2. Back pain  - no evidence of ACS 3. Chronic anemia - dating back to 2002 - BM bx: early mild dysplasia and/or sideroblastic changes  - no bleeding history 4. HTN 5. GERD 6. Moderate aortic stenosis by echo this admission  Secondary Discharge Diagnoses:  1. Dysphagia 2. H/o esophagitis 3. Hiatal hernia 4. Macular degeneration 5. H/o rheumatic fever as a child  Hospital Course: Anna Elliott is an 79 y/o F with a history of moderate AS, HTN, chronic anemia who presented to [facility] with complaints of back discomfort/presyncope and was found to be in rapid atrial fibrillation. She has h/o palpitations and was evaluated by cardiology for this remotely with reportedly normal stress testing at that time. She lives with her dtr and son-in-law in [place] and generally does pretty well for herself. She does not routinely exercise but is capable of carrying out chores and occasionally goes for walks at the mall. On the day of admission, shortly after waking up she began to develop back tightness and marked presyncope. She was seen in her PCP's office found to be in rapid afib and was treated with ASA. EMS was called and following their arrival, she was given sl ntg without relief of symptoms. Once in the ER, she was treated with IV diltiazem with improvement in HR into the low 100's with resolution of back discomfort and lightheadedness. The duration of her afib was not clearly defined as she had not had any actual palpitations with this episode. Labs were significant for her chronic anemia.  She had no reported bleeding. She was started on heparin for CHADSVASc of 4. Echo showed mild LVH, EF 65%, moderate AS with mean gradient , PA pressure , mildly dilated LA. TSH was WNL. She was transitioned to oral diltiazem. She ruled out for MI and Dr. Antoine Poche recommended no further ischemic workup. He had a long conversation with the patient and her daughter about blood thinner. There was concern about her anemia, but this was related to a production issue in the past rather than any history of prior bleeding. Ultimately Eliquis 2.5mg  BID was chosen, dosed lower given age >79 and weight right at 61kg. She remains in atrial fibrillation but is rate controlled and feels well. Dr. Patty Sermons has seen and examined her today and feels she is stable for discharge. The patient declined [facility] services.   The patient lives in [place] and is closest to [place], but Dr. Antoine Poche does not have any post-hospital appointments for over a month there. The patient is amenable to coming to [place] for her initial follow-up with Ms. Geni Bers PA-C as well as the [facility] for an education visit given that she's new to Eliquis. Future follow-up appointments can hopefully be arranged in [place] with Dr. Antoine Poche.  Discharge Vitals: Blood pressure 118/52, pulse 93, temperature 98.3 F (36.8 C), temperature source Oral, resp. rate 18, height 5\' 5"  (1.651 m), weight 136 lb (61.689 kg), SpO2 95.00%.  Labs: Lab Results  Component Value Date   WBC 5.7 06/24/2012   HGB 9.8* 06/24/2012   HCT 29.9* 06/24/2012   MCV 90.3 06/24/2012  PLT 180 06/24/2012     Recent Labs Lab 06/23/12 0540  NA 141  K 3.8  CL 106  CO2 25  BUN 18  CREATININE 0.85  CALCIUM 8.6  PROT 5.6*  BILITOT 0.9  ALKPHOS 52  ALT 12  AST 16  GLUCOSE 111*    Recent Labs  06/22/12 1801 06/22/12 2320 06/23/12 0540  TROPONINI <0.30 <0.30 <0.30   Lab Results  Component Value Date   CHOL 132 06/23/2012   HDL 41 06/23/2012    LDLCALC 71 06/23/2012   TRIG 100 06/23/2012    Diagnostic Studies/Procedures    2D Echo 06/23/12 - Left ventricle: The cavity size was normal. Wall thickness was increased in a pattern of mild LVH. The estimated ejection fraction was 65%. Wall motion was normal; there were no regional wall motion abnormalities. - Aortic valve: Calcified leaflets. Moderate aortic stenosis. Mean gradient: 23mm Hg (S). Peak gradient: 42mm Hg (S). - Mitral valve: Mildly calcified annulus. - Left atrium: The atrium was mildly dilated. - Pulmonary arteries: PA peak pressure: 42mm Hg (S).  Dg Chest Port 1 View 06/22/2012   *RADIOLOGY REPORT*  Clinical Data: Shortness of breath.  Hypertension.  PORTABLE CHEST - 1 VIEW  Comparison: None.  Findings: Midline trachea.  Cardiomegaly accentuated by AP portable technique.  No pleural effusion or pneumothorax. Minimal right cardiophrenic angle blunting is likely due to a prominent epicardial fat pad.  Biapical pleural thickening. No congestive failure.  Favor summation shadow over the right mid lung due to overlap of ribs.  IMPRESSION: Cardiomegaly, without acute disease or congestive failure.  Probable summation shadow projecting over the right midlung.  PA and lateral films could be performed to exclude pulmonary nodule.   Original Report Authenticated By: Jeronimo Greaves, M.D.    Discharge Medications     Medication List    STOP taking these medications       amLODipine 5 MG tablet  Commonly known as:  NORVASC      TAKE these medications       apixaban 2.5 MG Tabs tablet  Commonly known as:  ELIQUIS  Take 1 tablet (2.5 mg total) by mouth 2 (two) times daily.     CALCIUM + D PO  Take 2 tablets by mouth 2 (two) times daily.     cyanocobalamin 500 MCG tablet  Take 500 mcg by mouth daily.     diltiazem 120 MG 24 hr capsule  Commonly known as:  CARDIZEM CD  Take 1 capsule (120 mg total) by mouth daily.     EYE VITAMINS PO  Take 1 tablet by mouth daily.      lansoprazole 30 MG disintegrating tablet  Commonly known as:  PREVACID SOLUTAB  Take 30 mg by mouth daily.     zolpidem 10 MG tablet  Commonly known as:  AMBIEN  2.5 mg at bedtime as needed for sleep.        Disposition   The patient will be discharged in stable condition to home. Discharge Orders   Future Appointments Provider Department Dept Phone   07/06/2012 11:00 AM Dyann Kief, PA-C [facility] [place]) [phone]   07/20/2012 10:30 AM [facility] [phone]   09/30/2012 1:00 PM Mauri [facility] [phone]   09/30/2012 1:30 PM Reece Packer, MD [facility] [phone]   Future Orders Complete By Expires     Diet - low sodium  heart healthy  As directed     Increase activity slowly  As directed       Follow-up Information   Follow up with Jacolyn Reedy, PA-C. (07/06/12 at 11am. You will come to [facility] for your first after-hospital visits, then we can have you see Dr. Antoine Poche in [place] for future appointments.)    Contact information:   Architectural technologist - [facility] [address] [place] [address] [phone]       Follow up with Selena Batten. (07/20/12 at 10:30am for education visit in our [facility] since you were started on Eliquis.)    Contact information:   Architectural technologist - [facility] [address] [place] [address] [phone]         Duration of Discharge Encounter: Greater than 30 minutes including physician and PA time.  Signed, Kriste Basque Braxdon Gappa PA-C 06/24/2012, 1:03 PM

## 2012-06-24 NOTE — Progress Notes (Signed)
Discharge instructions given to pt and daughter . Both verbalized understanding 

## 2012-06-24 NOTE — Progress Notes (Addendum)
ANTICOAGULATION CONSULT NOTE - Initial Consult  Pharmacy Consult for Heparin Indication: Afib; USAP  Allergies  Allergen Reactions  . Codeine Other (See Comments)    Makes her sick  . Sulfonamide Derivatives Itching and Rash    Patient Measurements: Height: 5\' 5"  (165.1 cm) Weight: 136 lb (61.689 kg) (Scale C) IBW/kg (Calculated) : 57 Heparin Dosing Weight: 63kg  Vital Signs: Temp: 98.3 F (36.8 C) (05/30 0512) Temp src: Oral (05/30 0512) BP: 109/62 mmHg (05/30 0512) Pulse Rate: 70 (05/30 0512)  Labs:  Recent Labs  06/22/12 1300 06/22/12 1801 06/22/12 2320  06/23/12 0540 06/23/12 0910 06/23/12 1846 06/24/12 0530  HGB 10.7*  --   --   --  9.6*  --   --  9.8*  HCT 32.2*  --   --   --  29.2*  --   --  29.9*  PLT 184  --   --   --  173  --   --  180  LABPROT 13.7  --   --   --   --   --   --   --   INR 1.06  --   --   --   --   --   --   --   HEPARINUNFRC  --   --   --   < >  --  0.84* 0.65 0.59  CREATININE 0.93  --   --   --  0.85  --   --   --   TROPONINI  --  <0.30 <0.30  --  <0.30  --   --   --   < > = values in this interval not displayed.  Estimated Creatinine Clearance: 40.4 ml/min (by C-G formula based on Cr of 0.85).   Medical History: Past Medical History  Diagnosis Date  . Hypertension   . Moderate aortic stenosis     a. 10/2009 Echo: EF 60%, mod LVH w/ signif upper septal thickening, Mos AS (VA 1.07cm^2 VTI, 1.03cm^2 Vmax), mod dil LA, mildly dil RA.  Marland Kitchen Chronic anemia     a. dating back to 2002 - BM bx: early mild dysplasia and/or sideroblastic changes.  Marland Kitchen Dysphagia   . GERD (gastroesophageal reflux disease)   . Esophagitis   . Hiatal hernia   . H/O: rheumatic fever     a. as a child  . Heart murmur   . Macular degeneration, wet     "both eyes" (06/22/2012)    Medications:  Prescriptions prior to admission  Medication Sig Dispense Refill  . amLODipine (NORVASC) 5 MG tablet 5 mg daily.       . Calcium Carbonate-Vitamin D (CALCIUM + D PO)  Take 2 tablets by mouth 2 (two) times daily.      . cyanocobalamin 500 MCG tablet Take 500 mcg by mouth daily.      . Multiple Vitamins-Minerals (EYE VITAMINS PO) Take 1 tablet by mouth daily.      Marland Kitchen PREVACID SOLUTAB 30 MG disintegrating tablet 1 mg daily.       Marland Kitchen zolpidem (AMBIEN) 10 MG tablet 2.5 mg at bedtime as needed for sleep.         Assessment: 79 y/o female patient admitted with palpitations and chest pain requiring anticoagulation for r/o MI and afib. Heparin continue to be therapeutic. Awaiting decision on PO anticoagulant.  Goal of Therapy:  Heparin level 0.3-0.7 units/ml Monitor platelets by anticoagulation protocol: Yes   Plan:   Cont heparin at 850 units/hr  F/u with PO anticoag plan  Addendum:  Changing to apixaban today. Since she is >80 and wt is right at [facility], will use the lower dose instead.  Apixaban 2.5mg  PO bid

## 2012-06-27 ENCOUNTER — Telehealth: Payer: Self-pay | Admitting: *Deleted

## 2012-06-27 NOTE — Telephone Encounter (Signed)
New Problem  Pt's daughter is concerned about her moms BP.  Her BP at noon 148/91 pulse 131, last night her BP 129/73 pulse 112.  She is schedule to see Dr Patty Sermons June 11.  She asked if you could call her back.

## 2012-06-27 NOTE — Telephone Encounter (Signed)
Patient is actually scheduled to see Michelle L. PA and appears to be a Dr Antoine Poche patient. Will forward to Orlie Dakin RN

## 2012-06-27 NOTE — Telephone Encounter (Signed)
Spoke with daughter  - last night BP 129/73  HR 112, today 12N 148/91 HR 131 and this afternoon 128/74 HR 133, and 135/85 HR 132.  Daughter reports pt denies feeling poorly, no increase in SOB no new fatigue.  She has been doing some walking outside during the day and mainly ADLs otherwise though not cooking.  Advised daughter to have pt not to be up walking around and being concerned with exercise at this time.  Advised most important now is to get her HR below 100 bpm if possible and that she should rest.  Instructed daughter if HR continues to stay elevated after resting she should call the MD on call for further instructions with medications. Pt is taking medications as listed including Diltiazem 120 mg every am and Eliquis 2.5 mg BID.  Daughter states understanding.  She will check BP and HR in the am prior to taking her medications and one hour after.  She will call if it remains elevated.  She will also call if pt starts having any s/s or complaints.

## 2012-06-27 NOTE — Telephone Encounter (Signed)
TCM phone call - Line busy, unable to leave message - 6/2 - PE

## 2012-06-28 MED ORDER — METOPROLOL TARTRATE 25 MG PO TABS: ORAL_TABLET | ORAL | Status: DC

## 2012-06-28 NOTE — Telephone Encounter (Signed)
Spoke with patient's daughter, Eber Jones, who states that patient's heart rate continues to be elevated.  Patient's HR and BP at 0800 are 136/87, HR 124 and 119.  At 1000 117/80, 135 Rt arm; 129/86, 135 Lt arm.  Daughter states patient denies chest pain or SOB.  Daughter states the patient took Metoprolol in the hospital but they were not given a Rx at discharge.  I advised patient's daughter that Dr. Antoine Poche is not in the office today but that I will review her case with Dr. Patty Sermons, DOD.  Dr. Patty Sermons advised that patient take Metoprolol Tartrate 25 mg 1/2 tab (12.5 mg) in the morning and 1/2 tab (12.5 mg) in the evening.  Patient is to report to [place] in a day or two on how she is feeling since taking the Metoprolol.  I verified the patient's pharmacy and sent the Rx via escribe. Patient's daughter verbalized understanding of all instructions.

## 2012-06-28 NOTE — Telephone Encounter (Signed)
Follow up  Pt wants to talk with you about her moms pulse being to high.  Her pulse rate was 135 at 10 am, at 8 am it was 124.

## 2012-06-30 ENCOUNTER — Telehealth: Payer: Self-pay | Admitting: Gastroenterology

## 2012-06-30 ENCOUNTER — Telehealth: Payer: Self-pay | Admitting: Cardiology

## 2012-06-30 ENCOUNTER — Inpatient Hospital Stay (HOSPITAL_COMMUNITY)
Admission: EM | Admit: 2012-06-30 | Discharge: 2012-07-05 | DRG: 308 | Disposition: A | Discharge status: Home / Self Care | Payer: Medicare Other | Attending: Internal Medicine | Admitting: Internal Medicine

## 2012-06-30 ENCOUNTER — Encounter (HOSPITAL_COMMUNITY): Payer: Self-pay | Admitting: Emergency Medicine

## 2012-06-30 ENCOUNTER — Emergency Department (HOSPITAL_COMMUNITY): Payer: Medicare Other

## 2012-06-30 DIAGNOSIS — I359 Nonrheumatic aortic valve disorder, unspecified: Secondary | ICD-10-CM | POA: Diagnosis present

## 2012-06-30 DIAGNOSIS — I4891 Unspecified atrial fibrillation: Secondary | ICD-10-CM

## 2012-06-30 DIAGNOSIS — Z7901 Long term (current) use of anticoagulants: Secondary | ICD-10-CM

## 2012-06-30 DIAGNOSIS — I509 Heart failure, unspecified: Secondary | ICD-10-CM

## 2012-06-30 DIAGNOSIS — R1311 Dysphagia, oral phase: Secondary | ICD-10-CM | POA: Diagnosis present

## 2012-06-30 DIAGNOSIS — I440 Atrioventricular block, first degree: Secondary | ICD-10-CM | POA: Diagnosis not present

## 2012-06-30 DIAGNOSIS — D649 Anemia, unspecified: Secondary | ICD-10-CM | POA: Diagnosis present

## 2012-06-30 DIAGNOSIS — M25569 Pain in unspecified knee: Secondary | ICD-10-CM | POA: Diagnosis not present

## 2012-06-30 DIAGNOSIS — I5031 Acute diastolic (congestive) heart failure: Secondary | ICD-10-CM

## 2012-06-30 DIAGNOSIS — I5033 Acute on chronic diastolic (congestive) heart failure: Secondary | ICD-10-CM | POA: Diagnosis present

## 2012-06-30 DIAGNOSIS — I10 Essential (primary) hypertension: Secondary | ICD-10-CM | POA: Diagnosis present

## 2012-06-30 DIAGNOSIS — I350 Nonrheumatic aortic (valve) stenosis: Secondary | ICD-10-CM | POA: Diagnosis present

## 2012-06-30 DIAGNOSIS — D539 Nutritional anemia, unspecified: Secondary | ICD-10-CM | POA: Diagnosis present

## 2012-06-30 DIAGNOSIS — I498 Other specified cardiac arrhythmias: Secondary | ICD-10-CM | POA: Diagnosis not present

## 2012-06-30 DIAGNOSIS — K219 Gastro-esophageal reflux disease without esophagitis: Secondary | ICD-10-CM | POA: Diagnosis present

## 2012-06-30 LAB — BASIC METABOLIC PANEL
CO2: 24 mEq/L (ref 19–32)
Chloride: 102 mEq/L (ref 96–112)
Glucose, Bld: 122 mg/dL — ABNORMAL HIGH (ref 70–99)
Sodium: 138 mEq/L (ref 135–145)

## 2012-06-30 LAB — CBC
Hemoglobin: 9.9 g/dL — ABNORMAL LOW (ref 12.0–15.0)
MCV: 89.3 fL (ref 78.0–100.0)
Platelets: 194 10*3/uL (ref 150–400)
RBC: 3.36 MIL/uL — ABNORMAL LOW (ref 3.87–5.11)
WBC: 9 10*3/uL (ref 4.0–10.5)

## 2012-06-30 LAB — PROTIME-INR
INR: 1.21 (ref 0.00–1.49)
Prothrombin Time: 15.1 seconds (ref 11.6–15.2)

## 2012-06-30 LAB — POCT I-STAT TROPONIN I: Troponin i, poc: 0.02 ng/mL (ref 0.00–0.08)

## 2012-06-30 MED ORDER — ASPIRIN EC 325 MG PO TBEC
325.0000 mg | DELAYED_RELEASE_TABLET | Freq: Once | ORAL | Status: AC
  Administered 2012-06-30: 325 mg via ORAL
  Filled 2012-06-30: qty 1

## 2012-06-30 MED ORDER — DILTIAZEM HCL ER COATED BEADS 240 MG PO CP24
240.0000 mg | ORAL_CAPSULE | Freq: Every day | ORAL | Status: DC
  Filled 2012-06-30: qty 1

## 2012-06-30 MED ORDER — ONDANSETRON HCL 4 MG/2ML IJ SOLN
4.0000 mg | Freq: Four times a day (QID) | INTRAMUSCULAR | Status: DC | PRN
  Administered 2012-07-01: 4 mg via INTRAVENOUS

## 2012-06-30 MED ORDER — LIDOCAINE VISCOUS 2 % MT SOLN
15.0000 mL | Freq: Once | OROMUCOSAL | Status: AC
  Administered 2012-06-30: 15 mL via OROMUCOSAL
  Filled 2012-06-30: qty 15

## 2012-06-30 MED ORDER — SODIUM CHLORIDE 0.9 % IJ SOLN: 3.0000 mL | INTRAMUSCULAR | Status: DC | PRN

## 2012-06-30 MED ORDER — ASPIRIN EC 81 MG PO TBEC
81.0000 mg | DELAYED_RELEASE_TABLET | Freq: Every day | ORAL | Status: DC
  Filled 2012-06-30: qty 1

## 2012-06-30 MED ORDER — ZOLPIDEM TARTRATE 5 MG PO TABS
2.5000 mg | ORAL_TABLET | Freq: Every evening | ORAL | Status: DC | PRN
  Administered 2012-07-01 – 2012-07-04 (×5): 2.5 mg via ORAL
  Filled 2012-06-30 (×5): qty 1

## 2012-06-30 MED ORDER — FUROSEMIDE 10 MG/ML IJ SOLN
40.0000 mg | Freq: Once | INTRAMUSCULAR | Status: AC
  Administered 2012-06-30: 40 mg via INTRAVENOUS
  Filled 2012-06-30: qty 4

## 2012-06-30 MED ORDER — METOPROLOL TARTRATE 25 MG PO TABS: 25.0000 mg | ORAL_TABLET | Freq: Two times a day (BID) | ORAL | Status: DC

## 2012-06-30 MED ORDER — METOPROLOL TARTRATE 25 MG PO TABS
25.0000 mg | ORAL_TABLET | Freq: Four times a day (QID) | ORAL | Status: DC
  Administered 2012-07-01 (×2): 25 mg via ORAL
  Filled 2012-06-30 (×6): qty 1

## 2012-06-30 MED ORDER — DILTIAZEM HCL ER COATED BEADS 240 MG PO CP24: 240.0000 mg | ORAL_CAPSULE | Freq: Every day | ORAL | Status: DC

## 2012-06-30 MED ORDER — GI COCKTAIL ~~LOC~~
30.0000 mL | Freq: Once | ORAL | Status: AC
  Administered 2012-06-30: 30 mL via ORAL
  Filled 2012-06-30: qty 30

## 2012-06-30 MED ORDER — APIXABAN 2.5 MG PO TABS
2.5000 mg | ORAL_TABLET | Freq: Two times a day (BID) | ORAL | Status: DC
  Administered 2012-07-01 – 2012-07-05 (×10): 2.5 mg via ORAL
  Filled 2012-06-30 (×11): qty 1

## 2012-06-30 MED ORDER — PANTOPRAZOLE SODIUM 40 MG PO TBEC
40.0000 mg | DELAYED_RELEASE_TABLET | Freq: Every day | ORAL | Status: DC
  Administered 2012-07-01: 40 mg via ORAL
  Filled 2012-06-30 (×2): qty 1

## 2012-06-30 MED ORDER — ACETAMINOPHEN 325 MG PO TABS
650.0000 mg | ORAL_TABLET | ORAL | Status: DC | PRN
  Administered 2012-07-03: 325 mg via ORAL
  Administered 2012-07-05: 650 mg via ORAL
  Filled 2012-06-30 (×2): qty 2

## 2012-06-30 MED ORDER — DEXTROSE 5 % IV SOLN
5.0000 mg/h | INTRAVENOUS | Status: DC
  Administered 2012-06-30: 5 mg/h via INTRAVENOUS

## 2012-06-30 MED ORDER — CYANOCOBALAMIN 500 MCG PO TABS
500.0000 ug | ORAL_TABLET | Freq: Every day | ORAL | Status: DC
  Administered 2012-07-01 – 2012-07-05 (×5): 500 ug via ORAL
  Filled 2012-06-30 (×5): qty 1

## 2012-06-30 MED ORDER — DILTIAZEM LOAD VIA INFUSION
10.0000 mg | Freq: Once | INTRAVENOUS | Status: AC
  Administered 2012-06-30: 10 mg via INTRAVENOUS
  Filled 2012-06-30: qty 10

## 2012-06-30 MED ORDER — SODIUM CHLORIDE 0.9 % IV SOLN: 250.0000 mL | INTRAVENOUS | Status: DC | PRN

## 2012-06-30 MED ORDER — SODIUM CHLORIDE 0.9 % IJ SOLN
3.0000 mL | Freq: Two times a day (BID) | INTRAMUSCULAR | Status: DC
  Administered 2012-07-01 – 2012-07-03 (×4): 3 mL via INTRAVENOUS

## 2012-06-30 NOTE — ED Provider Notes (Signed)
History     CSN: [id number]  Arrival date & time 06/30/12  5621   First MD Initiated Contact with Patient 06/30/12 1832      Chief Complaint  Patient presents with  . Tachycardia    (Consider location/radiation/quality/duration/timing/severity/associated sxs/prior treatment) Patient is a 79 y.o. female presenting with general illness.  Illness Location:  Throat Quality:  "lump" Severity:  Moderate Onset quality:  Gradual Duration:  10 hours Timing:  Constant Progression:  Worsening Chronicity:  Recurrent Context:  Recent admission for afib with RVR, also tachycardic today to 140 Relieved by:  Nothing Worsened by:  Nothing Ineffective treatments:  Diltiazem Associated symptoms: no abdominal pain, no chest pain, no congestion, no cough, no diarrhea, no fever, no nausea, no rash, no rhinorrhea, no shortness of breath, no sore throat, no vomiting and no wheezing     Past Medical History  Diagnosis Date  . Hypertension   . Moderate aortic stenosis     a. Dx 2011. b. Echo 05/2012: EF 65%, mild LVH, mod AS mg , mildly dilated LA.  Marland Kitchen Chronic anemia     a. dating back to 2002 - BM bx: early mild dysplasia and/or sideroblastic changes.  Marland Kitchen Dysphagia   . GERD (gastroesophageal reflux disease)   . Esophagitis   . Hiatal hernia   . H/O: rheumatic fever     a. as a child  . Atrial fibrillation     a. Dx 05/2012 unknown duration, started on Eliqiuis.    Past Surgical History  Procedure Laterality Date  . Tonsillectomy    . Cesarean section  1962  . Abdominal hysterectomy  1967  . Cataract extraction w/ intraocular lens  implant, bilateral Bilateral 1990's  . Esophageal dilation      "twice in her lifetime, I think; most recent was early 2000's" (06/22/2012)    Family History  Problem Relation Age of Onset  . Heart failure Father   . Heart disease Brother   . Diabetes Father   . Colon cancer Brother   . Lung cancer Sister   . Lymphoma Sister     History   Substance Use Topics  . Smoking status: Never Smoker   . Smokeless tobacco: Never Used  . Alcohol Use: No    OB History   Grav Para Term Preterm Abortions TAB SAB Ect Mult Living                  Review of Systems  Constitutional: Negative for fever and chills.  HENT: Negative for congestion, sore throat and rhinorrhea.   Eyes: Negative for photophobia and visual disturbance.  Respiratory: Negative for cough, shortness of breath and wheezing.   Cardiovascular: Negative for chest pain and leg swelling.  Gastrointestinal: Negative for nausea, vomiting, abdominal pain, diarrhea and constipation.  Endocrine: Negative for polyphagia and polyuria.  Genitourinary: Negative for dysuria, flank pain, vaginal bleeding, vaginal discharge and enuresis.  Musculoskeletal: Negative for back pain and gait problem.  Skin: Negative for color change and rash.  Neurological: Negative for dizziness, syncope, light-headedness and numbness.  Hematological: Negative for adenopathy. Does not bruise/bleed easily.  All other systems reviewed and are negative.    Allergies  Codeine and Sulfonamide derivatives  Home Medications   Current Outpatient Rx  Name  Route  Sig  Dispense  Refill  . apixaban (ELIQUIS) 2.5 MG TABS tablet   Oral   Take 1 tablet (2.5 mg total) by mouth 2 (two) times daily.   60 tablet  3   . Calcium Carbonate-Vitamin D (CALCIUM + D PO)   Oral   Take 2 tablets by mouth 2 (two) times daily.         . cyanocobalamin 500 MCG tablet   Oral   Take 500 mcg by mouth daily.         Marland Kitchen diltiazem (CARDIZEM CD) 240 MG 24 hr capsule   Oral   Take 1 capsule (240 mg total) by mouth daily.   30 capsule   6   . lansoprazole (PREVACID SOLUTAB) 30 MG disintegrating tablet   Oral   Take 30 mg by mouth daily.         . metoprolol tartrate (LOPRESSOR) 25 MG tablet   Oral   Take 1 tablet (25 mg total) by mouth 2 (two) times daily.   60 tablet   6   . Multiple  Vitamins-Minerals (EYE VITAMINS PO)   Oral   Take 1 tablet by mouth daily.         Marland Kitchen zolpidem (AMBIEN) 10 MG tablet      2.5 mg at bedtime as needed for sleep.            BP 122/79  Pulse 98  Temp(Src) 98.2 F (36.8 C) (Oral)  Resp 21  SpO2 95%  Physical Exam  Vitals reviewed. Constitutional: She is oriented to person, place, and time. She appears well-developed and well-nourished.  HENT:  Head: Normocephalic and atraumatic.  Right Ear: External ear normal.  Left Ear: External ear normal.  Eyes: Conjunctivae and EOM are normal. Pupils are equal, round, and reactive to light.  Neck: Normal range of motion. Neck supple.  Cardiovascular: Normal heart sounds and intact distal pulses.  An irregularly irregular rhythm present. Tachycardia present.   Pulmonary/Chest: Effort normal and breath sounds normal.  Abdominal: Soft. Bowel sounds are normal.  Musculoskeletal: Normal range of motion.  Neurological: She is alert and oriented to person, place, and time.  Skin: Skin is warm and dry.    ED Course  Procedures (including critical care time)  Labs Reviewed  CBC - Abnormal; Notable for the following:    RBC 3.36 (*)    Hemoglobin 9.9 (*)    HCT 30.0 (*)    RDW 16.3 (*)    All other components within normal limits  BASIC METABOLIC PANEL - Abnormal; Notable for the following:    Glucose, Bld 122 (*)    GFR calc non Af Amer 54 (*)    GFR calc Af Amer 62 (*)    All other components within normal limits  PRO B NATRIURETIC PEPTIDE - Abnormal; Notable for the following:    Pro B Natriuretic peptide (BNP) 4255.0 (*)    All other components within normal limits  PROTIME-INR  POCT I-STAT TROPONIN I   Dg Chest Portable 1 View  06/30/2012   *RADIOLOGY REPORT*  Clinical Data: Tachycardia.  PORTABLE CHEST - 1 VIEW  Comparison: 06/22/2012.  Findings: Cardiomegaly is present.  Basilar atelectasis with small bilateral pleural effusions.  Interstitial pulmonary edema is also present.   Extensive calcification of tracheobronchial tree. Chronic right pleural apical thickening.  IMPRESSION: Findings compatible with mild CHF with cardiomegaly, small effusions and interstitial pulmonary edema.   Original Report Authenticated By: Andreas Newport, M.D.     1. Atrial fibrillation with RVR      Date: 06/30/2012  Rate: 137  Rhythm: atrial fibrillation  QRS Axis: normal  Intervals: afib, otherwise unremarkable  ST/T Wave abnormalities: nonspecific ST/T  changes  Conduction Disutrbances:none  Narrative Interpretation:   Old EKG Reviewed: changes noted    MDM  79 y.o. female  with pertinent PMH of afib, esophagitis, aortic stenosis presents with feeling of lump in throat and tachycardia beginning this am.  Pt has ho similar symptoms prior to last admission for afib with RVR.  HR today around 140 despite extra dose of dilt and lopressor around 7 hours prior to visit.  Physical exam demonstrated irregularly irregular HR, otherwise benign exam.  ECG with afib with RVR as above.      Pt given gi cocktail with lidocaine, no change in symptoms, however felt "wheezy" when laying flat, which for her is a new symptom. Labs as above demonstrated elevated BNP, negative initial trop.  CXR with cardiomegaly and pulmonary edema.  Consulted cardiology for admission.    Labs and imaging as above reviewed by myself and attending,Dr. Bebe Shaggy, with whom case was discussed.   1. Atrial fibrillation with RVR             Noel Gerold, MD 06/30/12 2327

## 2012-06-30 NOTE — Telephone Encounter (Signed)
Patient's daughter reports dysphagia.  She feels a "knot in the middle of her chest and needs her throat stretched" according to her daughter.  Patient will come in and see Mike Gip PA tomorrow at 10:00

## 2012-06-30 NOTE — ED Notes (Addendum)
Cardizem titrated to 10mg /hr HR 114 BP 122/79

## 2012-06-30 NOTE — ED Notes (Addendum)
Pt's daughter is requesting we not do any IV starts or draw blood from pt's Right arm d/t bruising from malpositioned IV's, also requesting we not stick the Left arm in areas that are bruised. This RN had one unsuccessful attempt at starting and IV on pt's Left arm. I was unable to get the IV to thread once in the vein. Daughter is requesting we call the IV team instead of this RN attempting another stick. Pt and daughter aware there will be a delay in care d/t the request for IV team to obtain IV.  Daughter at bedside standing over the top of this RN while trying to start IV, daughter also holding pt's arm smacking her arm, daughter states "I would rather you find an IV on this side of her arm instead of the back side of her arm.  IV team paged

## 2012-06-30 NOTE — ED Notes (Signed)
Pt c/o tachycardia; pt seen here for same and admitted recently; pt sts "lump in throat"

## 2012-06-30 NOTE — Telephone Encounter (Signed)
New Prob     Pts mothers HR is still very high and would like to speak to nurse. Please call.

## 2012-06-30 NOTE — Telephone Encounter (Signed)
Spoke with [name]. She reports pt with elevated heart rate today. Heart rate this AM at 7:30 was 126, blood pressure 154/98. At 10:30 heart rate was 142 and blood pressure 135/89. She checked while on phone with me and heart rate 139 and blood pressure 117/86. Pt is resting. Pt has taken AM meds. Daughter states heart rate has been running 120-126. No shortness of breath.  Reports tightness in chest and throat which she describes as feeling like a "lump" similar to when she had to have esophageal stretched.  I reviewed with Dr. Daleen Squibb (office DOD) who gave instructions for pt to take extra lopressor 12.5 mg and Cardizem CD 120 mg at this time.  She should increase doses to lopressor 25 mg by mouth twice daily and Cardizem CD 240 mg by mouth daily.  I gave these instructions to pt's daughter who verbalizes understanding of all instructions.  Daughter will continue to monitor pt's heart rate and blood pressure and call if heart rate remains elevated or pt experiences problems with blood pressure. She is aware if symptoms worsen pt should go to ED. Will send prescriptions for medication changes to [facility] in [place].

## 2012-06-30 NOTE — Telephone Encounter (Signed)
Per daughter she gave patient extra Metoprolol 12.5 mg at 11:45 and at 3:30 right arm blood pressure 147/95 heart rate 141 and left arm blood pressure 138/99 heart rate 140, higher than it was earlier today. Patient is also experiencing chest discomfort in middle of chest, unsure if it is a swallowing issue or what. Heart rate has consistently been above 120 since weekend.  Advised daughter to take her to the emergency room, verbalized understanding.

## 2012-06-30 NOTE — H&P (Addendum)
Admission History and Physical   Patient ID: Anna Elliott, MRN: [id number], DOB: 1921-09-28 79 y.o. Date of Encounter: 06/30/2012, 10:13 PM  Primary Physician: Josue Hector, MD Primary Cardiologist: New, Followed by Dr. Patty Sermons admission last week  Chief Complaint: A Fib with RVR  History of Present Illness: Anna Elliott is a 79 y.o. female who was discharged 5/30 after 2 day admission for A-Fib with RVR, she was started on PO diltiazem and metoprolol, and apixiban, and discharged home.  Starting on 6/2 patient's heart rate increased to the 130s, and her daughter has placed several calls to the [place] nurse.  Her metoprolol and diltiazem doses were doubled to 25mg  BID and 240mg  daily, respectively, without improvement.  Today she also had a "lump" in her throat and some epigastric fullness as well with the rapid heart rates.  No lightheadedness, diaphoresis, orthopnea, pnd, visual changes, focal weakness, SOB.  During her last admission she had a TTE showing moderate AS and an EF of > 65%.  Her A Fib last week presented as back pain and pre-syncope.  Cardiac enzymes were negative x3, no further ischemic workup.    Today in the ED she was given ASA 325, and started on a diltiazem gtt, with improvement in HR from 150s to 120s.  She is being placed in observation for further management.   Past Medical History  Diagnosis Date  . Hypertension   . Moderate aortic stenosis     a. Dx 2011. b. Echo 05/2012: EF 65%, mild LVH, mod AS mg , mildly dilated LA.  Marland Kitchen Chronic anemia     a. dating back to 2002 - BM bx: early mild dysplasia and/or sideroblastic changes.  Marland Kitchen Dysphagia   . GERD (gastroesophageal reflux disease)   . Esophagitis   . Hiatal hernia   . H/O: rheumatic fever     a. as a child  . Atrial fibrillation     a. Dx 05/2012 unknown duration, started on Eliqiuis.     Past Surgical History  Procedure Laterality Date  . Tonsillectomy    . Cesarean section  1962  .  Abdominal hysterectomy  1967  . Cataract extraction w/ intraocular lens  implant, bilateral Bilateral 1990's  . Esophageal dilation      "twice in her lifetime, I think; most recent was early 2000's" (06/22/2012)      Current Facility-Administered Medications  Medication Dose Route Frequency Provider Last Rate Last Dose  . diltiazem (CARDIZEM) 100 mg in dextrose 5 % 100 mL infusion  5-15 mg/hr Intravenous Continuous Noel Gerold, MD 5 mL/hr at 06/30/12 2013 5 mg/hr at 06/30/12 2013   Current Outpatient Prescriptions  Medication Sig Dispense Refill  . apixaban (ELIQUIS) 2.5 MG TABS tablet Take 1 tablet (2.5 mg total) by mouth 2 (two) times daily.  60 tablet  3  . Calcium Carbonate-Vitamin D (CALCIUM + D PO) Take 2 tablets by mouth 2 (two) times daily.      . cyanocobalamin 500 MCG tablet Take 500 mcg by mouth daily.      Marland Kitchen diltiazem (CARDIZEM CD) 240 MG 24 hr capsule Take 1 capsule (240 mg total) by mouth daily.  30 capsule  6  . lansoprazole (PREVACID SOLUTAB) 30 MG disintegrating tablet Take 30 mg by mouth daily.      . metoprolol tartrate (LOPRESSOR) 25 MG tablet Take 1 tablet (25 mg total) by mouth 2 (two) times daily.  60 tablet  6  . Multiple Vitamins-Minerals (EYE VITAMINS PO) Take 1 tablet  by mouth daily.      Marland Kitchen zolpidem (AMBIEN) 10 MG tablet 2.5 mg at bedtime as needed for sleep.           Allergies: Allergies  Allergen Reactions  . Codeine Other (See Comments)    Makes her sick  . Sulfonamide Derivatives Itching and Rash     Social History:  The patient  reports that she has never smoked. She has never used smokeless tobacco. She reports that she does not drink alcohol or use illicit drugs.   Family History:  The patient's family history includes Colon cancer in her brother; Diabetes in her father; Heart disease in her brother; Heart failure in her father; Lung cancer in her sister; and Lymphoma in her sister.   ROS:  Please see the history of present illness.   All other  systems reviewed and negative.   Vital Signs: Blood pressure 127/88, pulse 146, temperature 98.2 F (36.8 C), temperature source Oral, resp. rate 18, SpO2 95.00%.  PHYSICAL EXAM: General:  Elderly female, Well nourished, well developed, in no acute distress HEENT: normal Lymph: no adenopathy Neck: JVP elevated at 13cm Endocrine:  No thryomegaly Vascular: No carotid bruits; FA pulses 2+ bilaterally without bruits Cardiac:  normal S1, S2; RRR; 2/6 systolic murmur heard at RUSB and apex Lungs:  Bilateral basilar rales Abd: soft, nontender, no hepatomegaly Ext: trace edema bilaterally Musculoskeletal:  No deformities, BUE and BLE strength normal and equal Skin: warm and dry Neuro:  CNs 2-12 intact, no focal abnormalities noted Psych:  Normal affect   EKG:  A-Fib with RVR, mild (< 1mm) ST depressions inferior Labs:   Lab Results  Component Value Date   WBC 9.0 06/30/2012   HGB 9.9* 06/30/2012   HCT 30.0* 06/30/2012   MCV 89.3 06/30/2012   PLT 194 06/30/2012    Recent Labs Lab 06/30/12 1950  NA 138  K 4.4  CL 102  CO2 24  BUN 18  CREATININE 0.92  CALCIUM 9.2  GLUCOSE 122*   No results found for this basename: CKTOTAL, CKMB, TROPONINI,  in the last 72 hours Lab Results  Component Value Date   CHOL 132 06/23/2012   HDL 41 06/23/2012   LDLCALC 71 06/23/2012   TRIG 100 06/23/2012   No results found for this basename: DDIMER   BNP 4255   Radiology/Studies:  Dg Chest Portable 1 View  06/30/2012   *RADIOLOGY REPORT*  Clinical Data: Tachycardia.  PORTABLE CHEST - 1 VIEW  Comparison: 06/22/2012.  Findings: Cardiomegaly is present.  Basilar atelectasis with small bilateral pleural effusions.  Interstitial pulmonary edema is also present.  Extensive calcification of tracheobronchial tree. Chronic right pleural apical thickening.  IMPRESSION: Findings compatible with mild CHF with cardiomegaly, small effusions and interstitial pulmonary edema.   Original Report Authenticated By: Andreas Newport, M.D.   Dg Chest Port 1 View  06/22/2012   *RADIOLOGY REPORT*  Clinical Data: Shortness of breath.  Hypertension.  PORTABLE CHEST - 1 VIEW  Comparison: None.  Findings: Midline trachea.  Cardiomegaly accentuated by AP portable technique.  No pleural effusion or pneumothorax. Minimal right cardiophrenic angle blunting is likely due to a prominent epicardial fat pad.  Biapical pleural thickening. No congestive failure.  Favor summation shadow over the right mid lung due to overlap of ribs.  IMPRESSION: Cardiomegaly, without acute disease or congestive failure.  Probable summation shadow projecting over the right midlung.  PA and lateral films could be performed to exclude pulmonary nodule.   Original  Report Authenticated By: Jeronimo Greaves, M.D.     ASSESSMENT AND PLAN:   1. AFib with RVR  - She has failed a rate control strategy with low doses of both metoprolol and diltiazem.   - Our options are to increase her rate control meds, or attempt a rhythm control strategy.  Given her persistent elevated heart rate on diltiazem gtt, she will likely need rhythm control.  She also has HFpEF with moderate AS, which makes her more sensitive to A-fib in general.  Recommendations:  - NPO p MN for potential DCCV with TEE (not ordered)  - continue dilt gtt o/n, increase PO metoprolol dose to 25mg  q6hr.  Hold PO diltiazem dose incase she is cardioverted to avoid bradycardia post cardioversion.  - continue apixaban 2.5mg  BID  2.  HFpEF  - mild pulmonary edema on CXR and exam, BNP 4k.    - Lasix 40mg  x1 in ED  - comfortable on room air, reassess lasix response and volume status in AM  3.  Anginal symptoms  - I think this is secondary to AFib w/RVR  - will repeat troponin in the morning  4. Chronic anemia  - continue to follow   Signed,  Yaakov Guthrie, MD 06/30/2012, 10:13 PM

## 2012-06-30 NOTE — Telephone Encounter (Signed)
New Prob    Would like to speak to nurse regarding pts heart rate. Please call.

## 2012-07-01 ENCOUNTER — Encounter (HOSPITAL_COMMUNITY): Payer: Self-pay | Admitting: *Deleted

## 2012-07-01 ENCOUNTER — Ambulatory Visit: Payer: Medicare Other | Admitting: Physician Assistant

## 2012-07-01 DIAGNOSIS — I4891 Unspecified atrial fibrillation: Principal | ICD-10-CM

## 2012-07-01 LAB — TROPONIN I: Troponin I: <0.3 ng/mL (ref <0.30)

## 2012-07-01 LAB — BASIC METABOLIC PANEL
BUN: 18 mg/dL (ref 6–23)
Calcium: 8.7 mg/dL (ref 8.4–10.5)
GFR calc Af Amer: 58 mL/min — ABNORMAL LOW (ref >90)
GFR calc non Af Amer: 50 mL/min — ABNORMAL LOW (ref >90)
Potassium: 3.8 mEq/L (ref 3.5–5.1)
Sodium: 140 mEq/L (ref 135–145)

## 2012-07-01 LAB — MAGNESIUM: Magnesium: 2.2 mg/dL (ref 1.5–2.5)

## 2012-07-01 MED ORDER — METOPROLOL TARTRATE 50 MG PO TABS
50.0000 mg | ORAL_TABLET | Freq: Two times a day (BID) | ORAL | Status: DC
  Administered 2012-07-01 – 2012-07-05 (×8): 50 mg via ORAL
  Filled 2012-07-01 (×10): qty 1

## 2012-07-01 MED ORDER — DILTIAZEM HCL ER COATED BEADS 240 MG PO CP24: 240.0000 mg | ORAL_CAPSULE | Freq: Every day | ORAL | Status: DC

## 2012-07-01 MED ORDER — DILTIAZEM HCL ER COATED BEADS 300 MG PO CP24
300.0000 mg | ORAL_CAPSULE | Freq: Every day | ORAL | Status: DC
  Administered 2012-07-01: 300 mg via ORAL
  Filled 2012-07-01 (×2): qty 1

## 2012-07-01 NOTE — Progress Notes (Signed)
Attempted to ambulate patient in hall, pt stated felt a little dizzy, but wanted to try. 20 ft and felt faint, returned to room just in time to get on bed before giving out. Unable to ambulate due to dizzy feeling, which is making pt c/o n/v. RN gave zofran, but still not much change. Pt HR keeps flipping in and out of afib. Will continue to monitor for further changes.

## 2012-07-01 NOTE — Progress Notes (Signed)
Subjective:  The lump in the throat has resolved. No chest pain. Rhythm remains rapid 115/min. Cardiac enzymes are negative.  Objective:  Vital Signs in the last 24 hours: Temp:  [97.9 F (36.6 C)-98.2 F (36.8 C)] 97.9 F (36.6 C) (06/06 0330) Pulse Rate:  [43-146] 43 (06/06 0700) Resp:  [10-26] 17 (06/06 0700) BP: (101-150)/(45-114) 128/76 mmHg (06/06 0700) SpO2:  [91 %-98 %] 92 % (06/06 0700) Weight:  [138 lb 3.7 oz (62.7 kg)] 138 lb 3.7 oz (62.7 kg) (06/05 2337)  Intake/Output from previous day: 06/05 0701 - 06/06 0700 In: 294.8 [P.O.:240; I.V.:54.8] Out: 900 [Urine:900] Intake/Output from this shift: Total I/O In: -  Out: 300 [Urine:300]  . apixaban  2.5 mg Oral BID  . aspirin EC  81 mg Oral Daily  . cyanocobalamin  500 mcg Oral Daily  . metoprolol tartrate  25 mg Oral Q6H  . pantoprazole  40 mg Oral Daily  . sodium chloride  3 mL Intravenous Q12H   . diltiazem (CARDIZEM) infusion Stopped (07/01/12 0200)    Physical Exam: The patient appears to be in no distress.  Head and neck exam reveals that the pupils are equal and reactive.  The extraocular movements are full.  There is no scleral icterus.  Mouth and pharynx are benign.  No lymphadenopathy.  No carotid bruits.  The jugular venous pressure is normal.  Thyroid is not enlarged or tender.  Chest clear to auscultation after diuresis last pm. Heart reveals no abnormal lift or heave.  First and second heart sounds are normal.  There is no   gallop rub or click. Soft systolic murmur at base.  The abdomen is soft and nontender.  Bowel sounds are normoactive.  There is no hepatosplenomegaly or mass.  There are no abdominal bruits.  Extremities reveal no phlebitis or edema.  Pedal pulses are good.  There is no cyanosis or clubbing.  Neurologic exam is normal strength and no lateralizing weakness.  No sensory deficits.  Integument reveals no rash  Lab Results:  Recent Labs  06/30/12 1950  WBC 9.0  HGB  9.9*  PLT 194    Recent Labs  06/30/12 1950 07/01/12 0505  NA 138 140  K 4.4 3.8  CL 102 103  CO2 24 26  GLUCOSE 122* 95  BUN 18 18  CREATININE 0.92 0.98    Recent Labs  07/01/12 0505  TROPONINI <0.30   Hepatic Function Panel No results found for this basename: PROT, ALBUMIN, AST, ALT, ALKPHOS, BILITOT, BILIDIR, IBILI,  in the last 72 hours No results found for this basename: CHOL,  in the last 72 hours No results found for this basename: PROTIME,  in the last 72 hours  Imaging: Dg Chest Portable 1 View  06/30/2012   *RADIOLOGY REPORT*  Clinical Data: Tachycardia.  PORTABLE CHEST - 1 VIEW  Comparison: 06/22/2012.  Findings: Cardiomegaly is present.  Basilar atelectasis with small bilateral pleural effusions.  Interstitial pulmonary edema is also present.  Extensive calcification of tracheobronchial tree. Chronic right pleural apical thickening.  IMPRESSION: Findings compatible with mild CHF with cardiomegaly, small effusions and interstitial pulmonary edema.   Original Report Authenticated By: Andreas Newport, M.D.    Cardiac Studies: EKG shows atrial fib with RVR. No acute ischemic changes. Assessment/Plan:  1. Atrial fib with RVR 2. Acute on chronic diastolic CHF 3. Chronic anemia. 4. Chest/throat discomfort probable anginal secondary to RVR, resolved.  Plan:  Schedule is full.  No spot for TEE/DCCV today.  We will increase AV blocking drugs metoprolol and cardizem today. Increase ambulation. Continue eliquis. If rate control not sufficient over weekend, consider TEE/DCCV on Monday.   LOS: 1 day    [name] 07/01/2012, 9:55 AM

## 2012-07-01 NOTE — Progress Notes (Signed)
Pt HR brady into 40's- continues to flip in and out of afib, notified PA on call-new order to hold night dose of lopressor due to brady HR and will reassess in the morning. Will continue to monitor for changes.

## 2012-07-01 NOTE — Progress Notes (Signed)
Utilization review completed.  

## 2012-07-02 ENCOUNTER — Encounter (HOSPITAL_COMMUNITY): Payer: Self-pay | Admitting: Interventional Cardiology

## 2012-07-02 DIAGNOSIS — I5031 Acute diastolic (congestive) heart failure: Secondary | ICD-10-CM | POA: Insufficient documentation

## 2012-07-02 LAB — BASIC METABOLIC PANEL
BUN: 21 mg/dL (ref 6–23)
CO2: 26 mEq/L (ref 19–32)
Calcium: 8.7 mg/dL (ref 8.4–10.5)
Creatinine, Ser: 1.38 mg/dL — ABNORMAL HIGH (ref 0.50–1.10)
GFR calc non Af Amer: 33 mL/min — ABNORMAL LOW (ref >90)
Glucose, Bld: 101 mg/dL — ABNORMAL HIGH (ref 70–99)

## 2012-07-02 MED ORDER — LANSOPRAZOLE 15 MG PO TBDP
30.0000 mg | ORAL_TABLET | Freq: Two times a day (BID) | ORAL | Status: DC
  Administered 2012-07-02 – 2012-07-05 (×5): 30 mg via ORAL
  Filled 2012-07-02 (×5): qty 2

## 2012-07-02 MED ORDER — WHITE PETROLATUM GEL
Status: AC
  Administered 2012-07-02: 0.2
  Filled 2012-07-02: qty 5

## 2012-07-02 MED ORDER — NON FORMULARY: 30.0000 mg/d | Freq: Two times a day (BID) | Status: DC

## 2012-07-02 MED ORDER — LANSOPRAZOLE 15 MG PO TBDP: 30.0000 mg | ORAL_TABLET | Freq: Every day | ORAL | Status: DC

## 2012-07-02 MED ORDER — DILTIAZEM HCL 30 MG PO TABS
30.0000 mg | ORAL_TABLET | Freq: Four times a day (QID) | ORAL | Status: DC
  Administered 2012-07-02 – 2012-07-04 (×11): 30 mg via ORAL
  Filled 2012-07-02 (×17): qty 1

## 2012-07-02 NOTE — Progress Notes (Addendum)
SUBJECTIVE:  Alternating RVR with bradycardia.  OBJECTIVE:   Vitals:   Filed Vitals:   07/02/12 0515 07/02/12 0532 07/02/12 0700 07/02/12 0830  BP:    109/51  Pulse: 120 92  128  Temp:   98.1 F (36.7 C)   TempSrc:   Oral   Resp: 19 18  19   Height:      Weight:      SpO2: 95% 96%  90%   I&O's:   Intake/Output Summary (Last 24 hours) at 07/02/12 0957 Last data filed at 07/02/12 0700  Gross per 24 hour  Intake    600 ml  Output    550 ml  Net     50 ml   TELEMETRY: Reviewed telemetry pt in AFib with RVR:     PHYSICAL EXAM General: Well developed, well nourished, in no acute distress Head: Eyes PERRLA, No xanthomas.   Normal cephalic and atramatic  Lungs:   Clear bilaterally to auscultation and percussion. Heart:  Irregularly irregular, tachycardic, S1, S2, systolic murmur Abdomen: Bowel sounds are positive, abdomen soft and non-tender without masses or                  Hernia's noted.  Extremities:   No edema.   Neuro: Alert and oriented X 3. Psych:  Normal affect, responds appropriately   LABS: Basic Metabolic Panel:  Recent Labs  16/10/96 0505 07/02/12 0537  NA 140 138  K 3.8 4.4  CL 103 101  CO2 26 26  GLUCOSE 95 101*  BUN 18 21  CREATININE 0.98 1.38*  CALCIUM 8.7 8.7  MG 2.2  --    Liver Function Tests: No results found for this basename: AST, ALT, ALKPHOS, BILITOT, PROT, ALBUMIN,  in the last 72 hours No results found for this basename: LIPASE, AMYLASE,  in the last 72 hours CBC:  Recent Labs  06/30/12 1950  WBC 9.0  HGB 9.9*  HCT 30.0*  MCV 89.3  PLT 194   Cardiac Enzymes:  Recent Labs  07/01/12 0505  TROPONINI <0.30   BNP: No components found with this basename: POCBNP,  D-Dimer: No results found for this basename: DDIMER,  in the last 72 hours Hemoglobin A1C: No results found for this basename: HGBA1C,  in the last 72 hours Fasting Lipid Panel: No results found for this basename: CHOL, HDL, LDLCALC, TRIG, CHOLHDL, LDLDIRECT,   in the last 72 hours Thyroid Function Tests: No results found for this basename: TSH, T4TOTAL, FREET3, T3FREE, THYROIDAB,  in the last 72 hours Anemia Panel: No results found for this basename: VITAMINB12, FOLATE, FERRITIN, TIBC, IRON, RETICCTPCT,  in the last 72 hours Coag Panel:   Lab Results  Component Value Date   INR 1.21 06/30/2012   INR 1.06 06/22/2012    RADIOLOGY: Dg Chest Portable 1 View  06/30/2012   *RADIOLOGY REPORT*  Clinical Data: Tachycardia.  PORTABLE CHEST - 1 VIEW  Comparison: 06/22/2012.  Findings: Cardiomegaly is present.  Basilar atelectasis with small bilateral pleural effusions.  Interstitial pulmonary edema is also present.  Extensive calcification of tracheobronchial tree. Chronic right pleural apical thickening.  IMPRESSION: Findings compatible with mild CHF with cardiomegaly, small effusions and interstitial pulmonary edema.   Original Report Authenticated By: Andreas Newport, M.D.   Dg Chest Port 1 View  06/22/2012   *RADIOLOGY REPORT*  Clinical Data: Shortness of breath.  Hypertension.  PORTABLE CHEST - 1 VIEW  Comparison: None.  Findings: Midline trachea.  Cardiomegaly accentuated by AP portable technique.  No  pleural effusion or pneumothorax. Minimal right cardiophrenic angle blunting is likely due to a prominent epicardial fat pad.  Biapical pleural thickening. No congestive failure.  Favor summation shadow over the right mid lung due to overlap of ribs.  IMPRESSION: Cardiomegaly, without acute disease or congestive failure.  Probable summation shadow projecting over the right midlung.  PA and lateral films could be performed to exclude pulmonary nodule.   Original Report Authenticated By: Jeronimo Greaves, M.D.      ASSESSMENT: Atrial fibrillation with rapid ventricular response, symptomatic bradycardia.  Pulmonary edema by chest x-ray, likely related to diastolic dysfunction from atrial fibrillation.  PLAN:  Discussed with Dr. Patty Sermons.  Given that she had  bradycardia after receiving her dose of metoprolol and Cardizem 300 mg yesterday, we will change Cardizem to short acting.  I will decrease the dose to 30 mg of Cardizem 4 times a day.  We will titrate this up as needed for tachycardia.  She is not symptomatic with a tachycardia.  She was quite symptomatic with bradycardia it yesterday.  She felt very faint.  TEE cardioversion will be considered for Monday.  She may need a pacemaker in the future based on her age and her variable heart rate.  Diastolic heart failure: She is not in respiratory distress at this time.  Her creatinine has bumped slightly this morning.  I will hold off on giving any diuretic at this time.  Aortic stenosis.  Corky Crafts., MD  07/02/2012  9:57 AM

## 2012-07-02 NOTE — ED Provider Notes (Signed)
I have personally seen and examined the patient.  I have discussed the plan of care with the resident.  I have reviewed the documentation on PMH/FH/Soc. History.  I have reviewed the documentation of the resident and agree.  I have reviewed and agree with the ECG interpretation(s) documented by the resident.  Patient stable in the ED but did feel SOB while lying supine I spoke to patient/family extensively about need for further monitoring and control of her afib.     Joya Gaskins, MD 07/02/12 [id number]

## 2012-07-03 ENCOUNTER — Inpatient Hospital Stay (HOSPITAL_COMMUNITY): Payer: Medicare Other

## 2012-07-03 LAB — BASIC METABOLIC PANEL
BUN: 24 mg/dL — ABNORMAL HIGH (ref 6–23)
CO2: 26 mEq/L (ref 19–32)
Calcium: 8.2 mg/dL — ABNORMAL LOW (ref 8.4–10.5)
Creatinine, Ser: 1.16 mg/dL — ABNORMAL HIGH (ref 0.50–1.10)
Creatinine, Ser: 1.13 mg/dL — ABNORMAL HIGH (ref 0.50–1.10)
GFR calc Af Amer: 47 mL/min — ABNORMAL LOW (ref >90)
GFR calc non Af Amer: 40 mL/min — ABNORMAL LOW (ref >90)
Glucose, Bld: 97 mg/dL (ref 70–99)
Glucose, Bld: 148 mg/dL — ABNORMAL HIGH (ref 70–99)

## 2012-07-03 MED ORDER — FUROSEMIDE 40 MG PO TABS
40.0000 mg | ORAL_TABLET | Freq: Every day | ORAL | Status: DC
  Administered 2012-07-03 – 2012-07-04 (×2): 40 mg via ORAL
  Filled 2012-07-03 (×3): qty 1

## 2012-07-03 MED ORDER — SODIUM CHLORIDE 0.9 % IV SOLN
250.0000 mL | INTRAVENOUS | Status: DC
  Administered 2012-07-04: 250 mL via INTRAVENOUS

## 2012-07-03 MED ORDER — DIGOXIN 125 MCG PO TABS
0.1250 mg | ORAL_TABLET | Freq: Every day | ORAL | Status: DC
  Administered 2012-07-04: 0.125 mg via ORAL
  Filled 2012-07-03 (×2): qty 1

## 2012-07-03 MED ORDER — SODIUM CHLORIDE 0.9 % IJ SOLN: 3.0000 mL | INTRAMUSCULAR | Status: DC | PRN

## 2012-07-03 MED ORDER — DOCUSATE SODIUM 100 MG PO CAPS
100.0000 mg | ORAL_CAPSULE | Freq: Two times a day (BID) | ORAL | Status: DC
  Administered 2012-07-03 – 2012-07-05 (×5): 100 mg via ORAL
  Filled 2012-07-03 (×5): qty 1

## 2012-07-03 MED ORDER — POLYETHYLENE GLYCOL 3350 17 G PO PACK
17.0000 g | PACK | Freq: Every day | ORAL | Status: DC | PRN
  Administered 2012-07-03: 17 g via ORAL
  Filled 2012-07-03: qty 1

## 2012-07-03 MED ORDER — DIGOXIN 0.25 MG/ML IJ SOLN
0.2500 mg | Freq: Once | INTRAMUSCULAR | Status: AC
  Administered 2012-07-03: 0.25 mg via INTRAVENOUS
  Filled 2012-07-03: qty 1

## 2012-07-03 MED ORDER — SODIUM CHLORIDE 0.9 % IJ SOLN
3.0000 mL | Freq: Two times a day (BID) | INTRAMUSCULAR | Status: DC
  Administered 2012-07-03 – 2012-07-05 (×4): 3 mL via INTRAVENOUS

## 2012-07-03 NOTE — Progress Notes (Signed)
   Subjective:  Patient remains in atrial fib. Rate very rapid at times. Spoke to patient and daughter about TEE/Cardioversion for tomorrow. Patient states she does not want to stay in atrial fib and is willing to undergo TEE/ cardioversion. Denies chest pain.  No dyspnea at rest.  Objective:  Vital Signs in the last 24 hours: Temp:  [97.6 F (36.4 C)-98.8 F (37.1 C)] 97.6 F (36.4 C) (06/08 0840) Pulse Rate:  [51-136] 97 (06/08 1020) Resp:  [18-29] 29 (06/08 1020) BP: (106-138)/(52-85) 120/82 mmHg (06/08 0840) SpO2:  [91 %-98 %] 98 % (06/08 1020) Weight:  [141 lb 5 oz (64.1 kg)] 141 lb 5 oz (64.1 kg) (06/08 0341)  Intake/Output from previous day: 06/07 0701 - 06/08 0700 In: 600 [P.O.:600] Out: 600 [Urine:600] Intake/Output from this shift: Total I/O In: 240 [P.O.:240] Out: 200 [Urine:200]  . apixaban  2.5 mg Oral BID  . cyanocobalamin  500 mcg Oral Daily  . digoxin  0.25 mg Intravenous Once  . [START ON 07/04/2012] digoxin  0.125 mg Oral Daily  . diltiazem  30 mg Oral QID  . docusate sodium  100 mg Oral BID  . lansoprazole  30 mg Oral BID WC  . metoprolol tartrate  50 mg Oral BID  . sodium chloride  3 mL Intravenous Q12H      Physical Exam: The patient appears to be in no distress.  Head and neck exam reveals that the pupils are equal and reactive.  The extraocular movements are full.  There is no scleral icterus.  Mouth and pharynx are benign.  No lymphadenopathy.  No carotid bruits.  The jugular venous pressure is normal.  Thyroid is not enlarged or tender.  Chest few fine rales. Heart reveals no abnormal lift or heave.  First and second heart sounds are normal.  There is no   gallop rub or click. Soft systolic murmur at base.  The abdomen is soft and nontender.  Bowel sounds are normoactive.  There is no hepatosplenomegaly or mass.  There are no abdominal bruits.  Extremities reveal no phlebitis or edema.  Pedal pulses are good.  There is no cyanosis or  clubbing.  Neurologic exam is normal strength and no lateralizing weakness.  No sensory deficits.  Integument reveals no rash  Lab Results:  Recent Labs  06/30/12 1950  WBC 9.0  HGB 9.9*  PLT 194    Recent Labs  07/02/12 0537 07/03/12 0526  NA 138 138  K 4.4 4.2  CL 101 103  CO2 26 27  GLUCOSE 101* 97  BUN 21 24*  CREATININE 1.38* 1.16*    Recent Labs  07/01/12 0505  TROPONINI <0.30   Hepatic Function Panel No results found for this basename: PROT, ALBUMIN, AST, ALT, ALKPHOS, BILITOT, BILIDIR, IBILI,  in the last 72 hours No results found for this basename: CHOL,  in the last 72 hours No results found for this basename: PROTIME,  in the last 72 hours  Imaging: No results found.  Cardiac Studies: EKG shows atrial fib with RVR. No acute ischemic changes. Assessment/Plan:  1. Atrial fib with variable response, rapid at times. 2. Acute on chronic diastolic CHF 3. Chronic anemia. 4. Chest/throat discomfort probable anginal secondary to RVR, resolved.  Plan: Will place on schedule for TEE cardioversion for Monday. Continue eliquis. Update chest xray and EKG today. Add low dose digoxin for additional rate control.   LOS: 3 days    [name] 07/03/2012, 11:23 AM

## 2012-07-04 ENCOUNTER — Encounter (HOSPITAL_COMMUNITY)
Admission: EM | Disposition: A | Discharge status: Home / Self Care | Payer: Self-pay | Source: Home / Self Care | Attending: Internal Medicine

## 2012-07-04 ENCOUNTER — Inpatient Hospital Stay (HOSPITAL_COMMUNITY): Payer: Medicare Other | Admitting: *Deleted

## 2012-07-04 ENCOUNTER — Encounter (HOSPITAL_COMMUNITY): Payer: Self-pay | Admitting: *Deleted

## 2012-07-04 DIAGNOSIS — I4891 Unspecified atrial fibrillation: Secondary | ICD-10-CM

## 2012-07-04 HISTORY — PX: TEE WITHOUT CARDIOVERSION: SHX5443

## 2012-07-04 HISTORY — PX: CARDIOVERSION: SHX1299

## 2012-07-04 LAB — BASIC METABOLIC PANEL
CO2: 30 mEq/L (ref 19–32)
Calcium: 8.8 mg/dL (ref 8.4–10.5)
Creatinine, Ser: 1.05 mg/dL (ref 0.50–1.10)
Glucose, Bld: 107 mg/dL — ABNORMAL HIGH (ref 70–99)

## 2012-07-04 LAB — CBC
MCH: 28.6 pg (ref 26.0–34.0)
MCV: 89.3 fL (ref 78.0–100.0)
Platelets: 204 10*3/uL (ref 150–400)
RBC: 3.18 MIL/uL — ABNORMAL LOW (ref 3.87–5.11)
RDW: 16.1 % — ABNORMAL HIGH (ref 11.5–15.5)

## 2012-07-04 SURGERY — ECHOCARDIOGRAM, TRANSESOPHAGEAL
Anesthesia: General

## 2012-07-04 MED ORDER — MIDAZOLAM HCL 5 MG/ML IJ SOLN
INTRAMUSCULAR | Status: AC
  Filled 2012-07-04: qty 2

## 2012-07-04 MED ORDER — FENTANYL CITRATE 0.05 MG/ML IJ SOLN
INTRAMUSCULAR | Status: AC
  Filled 2012-07-04: qty 2

## 2012-07-04 MED ORDER — DILTIAZEM HCL 30 MG PO TABS
30.0000 mg | ORAL_TABLET | Freq: Once | ORAL | Status: AC
  Administered 2012-07-04: 30 mg via ORAL
  Filled 2012-07-04: qty 1

## 2012-07-04 MED ORDER — SODIUM CHLORIDE 0.9 % IV SOLN: INTRAVENOUS | Status: DC

## 2012-07-04 MED ORDER — MIDAZOLAM HCL 10 MG/2ML IJ SOLN
INTRAMUSCULAR | Status: DC | PRN
  Administered 2012-07-04 (×2): 2 mg via INTRAVENOUS

## 2012-07-04 MED ORDER — FENTANYL CITRATE 0.05 MG/ML IJ SOLN
INTRAMUSCULAR | Status: DC | PRN
  Administered 2012-07-04 (×2): 25 ug via INTRAVENOUS

## 2012-07-04 MED ORDER — SODIUM CHLORIDE 0.9 % IV SOLN
INTRAVENOUS | Status: DC | PRN
  Administered 2012-07-04: 13:00:00 via INTRAVENOUS

## 2012-07-04 MED ORDER — PROPOFOL 10 MG/ML IV BOLUS
INTRAVENOUS | Status: DC | PRN
  Administered 2012-07-04: 50 mg via INTRAVENOUS

## 2012-07-04 MED ORDER — LIDOCAINE VISCOUS 2 % MT SOLN
OROMUCOSAL | Status: DC | PRN
  Administered 2012-07-04: 10 mL via OROMUCOSAL

## 2012-07-04 MED ORDER — LIDOCAINE VISCOUS 2 % MT SOLN
OROMUCOSAL | Status: AC
  Filled 2012-07-04: qty 15

## 2012-07-04 NOTE — Progress Notes (Signed)
 Subjective:  Patient remains in atrial fib. Rate 130s at present. Will give morning meds early. Spoke to patient and daughter about TEE/Cardioversion.  Patient states she does not want to stay in atrial fib and is willing to undergo TEE/ cardioversion. Denies chest pain.  No dyspnea at rest. 3 lb diuresis after lasix yesterday.  Objective:  Vital Signs in the last 24 hours: Temp:  [97.6 F (36.4 C)-98.6 F (37 C)] 98.6 F (37 C) (06/09 0429) Pulse Rate:  [78-136] 99 (06/09 0800) Resp:  [17-29] 21 (06/09 0800) BP: (98-134)/(52-90) 114/59 mmHg (06/09 0800) SpO2:  [92 %-98 %] 95 % (06/09 0800) Weight:  [138 lb 10.7 oz (62.9 kg)] 138 lb 10.7 oz (62.9 kg) (06/09 0429)  Intake/Output from previous day: 06/08 0701 - 06/09 0700 In: 960 [P.O.:960] Out: 2525 [Urine:2525] Intake/Output from this shift:    . apixaban  2.5 mg Oral BID  . cyanocobalamin  500 mcg Oral Daily  . digoxin  0.125 mg Oral Daily  . diltiazem  30 mg Oral QID  . docusate sodium  100 mg Oral BID  . furosemide  40 mg Oral Daily  . lansoprazole  30 mg Oral BID WC  . metoprolol tartrate  50 mg Oral BID  . sodium chloride  3 mL Intravenous Q12H   . sodium chloride      Physical Exam: The patient appears to be in no distress.  Head and neck exam reveals that the pupils are equal and reactive.  The extraocular movements are full.  There is no scleral icterus.  Mouth and pharynx are benign.  No lymphadenopathy.  No carotid bruits.  The jugular venous pressure is normal.  Thyroid is not enlarged or tender.  Chest few fine rales.  Heart reveals no abnormal lift or heave.  First and second heart sounds are normal.  There is no   gallop rub or click. Soft systolic murmur at base.  The abdomen is soft and nontender.  Bowel sounds are normoactive.  There is no hepatosplenomegaly or mass.  There are no abdominal bruits.  Extremities reveal no phlebitis or edema.  Pedal pulses are good.  There is no cyanosis or  clubbing.  Neurologic exam is normal strength and no lateralizing weakness.  No sensory deficits.  Integument reveals no rash  Lab Results:  Recent Labs  07/04/12 0535  WBC 6.8  HGB 9.1*  PLT 204    Recent Labs  07/03/12 1424 07/04/12 0535  NA 136 139  K 4.3 4.1  CL 102 101  CO2 26 30  GLUCOSE 148* 107*  BUN 23 23  CREATININE 1.13* 1.05   No results found for this basename: TROPONINI, CK, MB,  in the last 72 hours Hepatic Function Panel No results found for this basename: PROT, ALBUMIN, AST, ALT, ALKPHOS, BILITOT, BILIDIR, IBILI,  in the last 72 hours No results found for this basename: CHOL,  in the last 72 hours No results found for this basename: PROTIME,  in the last 72 hours  Imaging: Dg Chest Port 1 View  07/03/2012   *RADIOLOGY REPORT*  Clinical Data: CHF.  Short of breath.  PORTABLE CHEST - 1 VIEW  Comparison: 06/30/2012.  Findings: Persistent cardiomegaly.  Aortic arch atherosclerosis. Calcification of the tracheobronchial tree.  Chronic symmetric pleural apical thickening.  Small left pleural effusion is present.  Probable small right pleural effusion present as well.  Mild basilar atelectasis.  No definite edema.  IMPRESSION: Cardiomegaly and small bilateral pleural effusions compatible   with mild CHF.  No edema.   Original Report Authenticated By: Geoffrey Lamke, M.D.    Cardiac Studies: EKG shows atrial fib with RVR. No acute ischemic changes. Assessment/Plan:  1. Atrial fib with variable response, rapid at times. 2. Acute on chronic diastolic CHF 3. Chronic anemia. 4. Chest/throat discomfort probable anginal secondary to RVR, resolved.  Plan: Scheduled for TEE cardioversion today. Continue eliquis.  Yesterday added low dose digoxin for additional rate control.   LOS: 4 days    Anna Elliott 07/04/2012, 8:31 AM    

## 2012-07-04 NOTE — Op Note (Signed)
Patient anesthetized with 50 mg propofol IV  With pads in AP position patient cardioverted to SR with 200 J synchronized biphasic energy.

## 2012-07-04 NOTE — Interval H&P Note (Signed)
History and Physical Interval Note:  07/04/2012 12:33 PM  Anna Elliott  has presented today for surgery, with the diagnosis of a-fib  The various methods of treatment have been discussed with the patient and family. After consideration of risks, benefits and other options for treatment, the patient has consented to  Procedure(s) with comments: TRANSESOPHAGEAL ECHOCARDIOGRAM (TEE) (N/A) - talk to Tom CARDIOVERSION (N/A) as a surgical intervention .  The patient's history has been reviewed, patient examined, no change in status, stable for surgery.  I have reviewed the patient's chart and labs.  Questions were answered to the patient's satisfaction.     Dietrich Pates

## 2012-07-04 NOTE — Transfer of Care (Signed)
Immediate Anesthesia Transfer of Care Note  Patient: Anna Elliott  Procedure(s) Performed: Procedure(s) with comments: TRANSESOPHAGEAL ECHOCARDIOGRAM (TEE) (N/A) - talk to Tom CARDIOVERSION (N/A)  Patient Location: PACU and Endoscopy Unit  Anesthesia Type:General  Level of Consciousness: patient cooperative, lethargic and responds to stimulation  Airway & Oxygen Therapy: Patient Spontanous Breathing and Patient connected to nasal cannula oxygen  Post-op Assessment: Report given to PACU RN and Post -op Vital signs reviewed and stable  Post vital signs: Reviewed and stable  Complications: No apparent anesthesia complications

## 2012-07-04 NOTE — Op Note (Signed)
No thrombus present   Moderate AS Normal LVEF   Full report to follow

## 2012-07-04 NOTE — H&P (View-Only) (Signed)
Subjective:  Patient remains in atrial fib. Rate 130s at present. Will give morning meds early. Spoke to patient and daughter about TEE/Cardioversion.  Patient states she does not want to stay in atrial fib and is willing to undergo TEE/ cardioversion. Denies chest pain.  No dyspnea at rest. 3 lb diuresis after lasix yesterday.  Objective:  Vital Signs in the last 24 hours: Temp:  [97.6 F (36.4 C)-98.6 F (37 C)] 98.6 F (37 C) (06/09 0429) Pulse Rate:  [78-136] 99 (06/09 0800) Resp:  [17-29] 21 (06/09 0800) BP: (98-134)/(52-90) 114/59 mmHg (06/09 0800) SpO2:  [92 %-98 %] 95 % (06/09 0800) Weight:  [138 lb 10.7 oz (62.9 kg)] 138 lb 10.7 oz (62.9 kg) (06/09 0429)  Intake/Output from previous day: 06/08 0701 - 06/09 0700 In: 960 [P.O.:960] Out: 2525 [Urine:2525] Intake/Output from this shift:    . apixaban  2.5 mg Oral BID  . cyanocobalamin  500 mcg Oral Daily  . digoxin  0.125 mg Oral Daily  . diltiazem  30 mg Oral QID  . docusate sodium  100 mg Oral BID  . furosemide  40 mg Oral Daily  . lansoprazole  30 mg Oral BID WC  . metoprolol tartrate  50 mg Oral BID  . sodium chloride  3 mL Intravenous Q12H   . sodium chloride      Physical Exam: The patient appears to be in no distress.  Head and neck exam reveals that the pupils are equal and reactive.  The extraocular movements are full.  There is no scleral icterus.  Mouth and pharynx are benign.  No lymphadenopathy.  No carotid bruits.  The jugular venous pressure is normal.  Thyroid is not enlarged or tender.  Chest few fine rales.  Heart reveals no abnormal lift or heave.  First and second heart sounds are normal.  There is no   gallop rub or click. Soft systolic murmur at base.  The abdomen is soft and nontender.  Bowel sounds are normoactive.  There is no hepatosplenomegaly or mass.  There are no abdominal bruits.  Extremities reveal no phlebitis or edema.  Pedal pulses are good.  There is no cyanosis or  clubbing.  Neurologic exam is normal strength and no lateralizing weakness.  No sensory deficits.  Integument reveals no rash  Lab Results:  Recent Labs  07/04/12 0535  WBC 6.8  HGB 9.1*  PLT 204    Recent Labs  07/03/12 1424 07/04/12 0535  NA 136 139  K 4.3 4.1  CL 102 101  CO2 26 30  GLUCOSE 148* 107*  BUN 23 23  CREATININE 1.13* 1.05   No results found for this basename: TROPONINI, CK, MB,  in the last 72 hours Hepatic Function Panel No results found for this basename: PROT, ALBUMIN, AST, ALT, ALKPHOS, BILITOT, BILIDIR, IBILI,  in the last 72 hours No results found for this basename: CHOL,  in the last 72 hours No results found for this basename: PROTIME,  in the last 72 hours  Imaging: Dg Chest Port 1 View  07/03/2012   *RADIOLOGY REPORT*  Clinical Data: CHF.  Short of breath.  PORTABLE CHEST - 1 VIEW  Comparison: 06/30/2012.  Findings: Persistent cardiomegaly.  Aortic arch atherosclerosis. Calcification of the tracheobronchial tree.  Chronic symmetric pleural apical thickening.  Small left pleural effusion is present.  Probable small right pleural effusion present as well.  Mild basilar atelectasis.  No definite edema.  IMPRESSION: Cardiomegaly and small bilateral pleural effusions compatible  with mild CHF.  No edema.   Original Report Authenticated By: Andreas Newport, M.D.    Cardiac Studies: EKG shows atrial fib with RVR. No acute ischemic changes. Assessment/Plan:  1. Atrial fib with variable response, rapid at times. 2. Acute on chronic diastolic CHF 3. Chronic anemia. 4. Chest/throat discomfort probable anginal secondary to RVR, resolved.  Plan: Scheduled for TEE cardioversion today. Continue eliquis.  Yesterday added low dose digoxin for additional rate control.   LOS: 4 days    [name] 07/04/2012, 8:31 AM

## 2012-07-04 NOTE — Anesthesia Preprocedure Evaluation (Addendum)
Anesthesia Evaluation  Patient identified by MRN, date of birth, ID band Patient awake    Reviewed: Allergy & Precautions, H&P , NPO status , Patient's Chart, lab work & pertinent test results  Airway Mallampati: II TM Distance: >3 FB Neck ROM: Full    Dental  (+) Teeth Intact and Dental Advisory Given   Pulmonary neg pulmonary ROS,  breath sounds clear to auscultation        Cardiovascular hypertension, Pt. on medications + dysrhythmias Atrial Fibrillation Rhythm:Irregular Rate:Normal     Neuro/Psych    GI/Hepatic Neg liver ROS, hiatal hernia, GERD-  Medicated and Controlled,  Endo/Other  negative endocrine ROS  Renal/GU negative Renal ROS     Musculoskeletal   Abdominal   Peds  Hematology negative hematology ROS (+)   Anesthesia Other Findings   Reproductive/Obstetrics                           Anesthesia Physical Anesthesia Plan  ASA: III  Anesthesia Plan: General   Post-op Pain Management:    Induction: Intravenous  Airway Management Planned: Mask  Additional Equipment:   Intra-op Plan:   Post-operative Plan:   Informed Consent: I have reviewed the patients History and Physical, chart, labs and discussed the procedure including the risks, benefits and alternatives for the proposed anesthesia with the patient or authorized representative who has indicated his/her understanding and acceptance.   Dental advisory given  Plan Discussed with: Anesthesiologist  Anesthesia Plan Comments:         Anesthesia Quick Evaluation

## 2012-07-04 NOTE — Progress Notes (Signed)
At approximately 0405 pt HR elevated into 120's-130's and sustaining. BP 129/85 and pt otherwise assymptomatic. MD Tresa Endo at bedside, new orders received. Will continue to monitor.

## 2012-07-05 ENCOUNTER — Inpatient Hospital Stay (HOSPITAL_COMMUNITY): Payer: Medicare Other

## 2012-07-05 ENCOUNTER — Encounter (HOSPITAL_COMMUNITY): Payer: Self-pay | Admitting: Internal Medicine

## 2012-07-05 DIAGNOSIS — Z7901 Long term (current) use of anticoagulants: Secondary | ICD-10-CM

## 2012-07-05 DIAGNOSIS — I5033 Acute on chronic diastolic (congestive) heart failure: Secondary | ICD-10-CM | POA: Diagnosis present

## 2012-07-05 DIAGNOSIS — I4891 Unspecified atrial fibrillation: Secondary | ICD-10-CM | POA: Diagnosis present

## 2012-07-05 MED ORDER — DSS 100 MG PO CAPS: 100.0000 mg | ORAL_CAPSULE | Freq: Two times a day (BID) | ORAL | Status: DC | PRN

## 2012-07-05 MED ORDER — METOPROLOL TARTRATE 50 MG PO TABS: 50.0000 mg | ORAL_TABLET | Freq: Two times a day (BID) | ORAL | Status: DC

## 2012-07-05 MED ORDER — FUROSEMIDE 20 MG PO TABS
20.0000 mg | ORAL_TABLET | Freq: Every day | ORAL | Status: DC
  Administered 2012-07-05: 20 mg via ORAL
  Filled 2012-07-05: qty 1

## 2012-07-05 MED ORDER — FUROSEMIDE 20 MG PO TABS: 20.0000 mg | ORAL_TABLET | Freq: Every day | ORAL | Status: DC

## 2012-07-05 NOTE — Anesthesia Postprocedure Evaluation (Signed)
  Anesthesia Post-op Note  Patient: Anna Elliott  Procedure(s) Performed: Procedure(s) with comments: TRANSESOPHAGEAL ECHOCARDIOGRAM (TEE) (N/A) - talk to Tom CARDIOVERSION (N/A)  Patient Location: Endoscopy Unit  Anesthesia Type:General  Level of Consciousness: awake  Airway and Oxygen Therapy: Patient Spontanous Breathing  Post-op Pain: none  Post-op Assessment: Post-op Vital signs reviewed, Patient's Cardiovascular Status Stable, Respiratory Function Stable, Patent Airway and No signs of Nausea or vomiting  Post-op Vital Signs: Reviewed and stable  Complications: No apparent anesthesia complications

## 2012-07-05 NOTE — Discharge Summary (Signed)
CARDIOLOGY DISCHARGE SUMMARY   Patient ID: Anna Elliott MRN: [id number] DOB/AGE: Jun 12, 1921 79 y.o.  Admit date: 06/30/2012 Discharge date: 07/05/2012  Primary Discharge Diagnosis:   Atrial fibrillation with rapid ventricular response Secondary Discharge Diagnosis:    HYPERTENSION   Moderate aortic stenosis   Anticoagulated with Eliquis   Anemia   Acute on chronic diastolic CHF  Procedures: TEE, DCCV  Hospital Course: Anna Elliott is a 79 y.o. female with a history of PAF. She had recently been hospitalized for this and started on a beta blocker plus diltiazem. She had an increase in her heart rate and was symptomatic with chest pain plus shortness of breath, felt secondary to the tachycardia. She came to the hospital and was admitted for further evaluation and treatment.  Her cardiac enzymes were checked and were negative for MI. Her TSH had been checked within the last 2 weeks and was within normal limits. Because her heart rate was difficult to control despite increasing her calcium channel blocker and beta blocker, a TEE cardioversion was recommended. This was performed on 07/04/2012. The TEE results are below. With pads in AP position, patient cardioverted to SR with 200 J synchronized biphasic energy. She tolerated the procedure well.   She had previously been started on Eliquis and this was continued. Her labs were reviewed and showed a slight decrease in her hemoglobin but this is felt secondary to blood draws and will be followed as an outpatient. She had some by mouth a low dose was felt secondary to the atrial fibrillation with rapid ventricular response. She was treated with IV Lasix and transitioned to oral medications once her volume status improved. She complained of some right knee pain and an x-ray was performed that showed no acute issues. Medical therapy is recommended with when necessary medications.  On 07/05/2012, Ms. [name] was seen by Dr. Patty Sermons. She was  maintaining sinus rhythm with a first degree block. She was ambulating without chest pain or shortness of breath and considered stable for discharge, to follow up closely as an outpatient.  Labs:   Lab Results  Component Value Date   WBC 6.8 07/04/2012   HGB 9.1* 07/04/2012   HCT 28.4* 07/04/2012   MCV 89.3 07/04/2012   PLT 204 07/04/2012     Recent Labs Lab 07/04/12 0535  NA 139  K 4.1  CL 101  CO2 30  BUN 23  CREATININE 1.05  CALCIUM 8.8  GLUCOSE 107*   Pro B Natriuretic peptide (BNP)  Date/Time Value Range Status  07/03/2012 12:00 PM 3694.0* 0 - 450 pg/mL Final  06/30/2012  7:51 PM 4255.0* 0 - 450 pg/mL Final    Lab Results  Component Value Date   TROPONINI <0.30 07/01/2012   Radiology: Dg Chest Port 1 View 07/03/2012   *RADIOLOGY REPORT*  Clinical Data: CHF.  Short of breath.  PORTABLE CHEST - 1 VIEW  Comparison: 06/30/2012.  Findings: Persistent cardiomegaly.  Aortic arch atherosclerosis. Calcification of the tracheobronchial tree.  Chronic symmetric pleural apical thickening.  Small left pleural effusion is present.  Probable small right pleural effusion present as well.  Mild basilar atelectasis.  No definite edema.  IMPRESSION: Cardiomegaly and small bilateral pleural effusions compatible with mild CHF.  No edema.   Original Report Authenticated By: Andreas Newport, M.D.   Dg Chest Portable 1 View 06/30/2012   *RADIOLOGY REPORT*  Clinical Data: Tachycardia.  PORTABLE CHEST - 1 VIEW  Comparison: 06/22/2012.  Findings: Cardiomegaly is present.  Basilar atelectasis with small  bilateral pleural effusions.  Interstitial pulmonary edema is also present.  Extensive calcification of tracheobronchial tree. Chronic right pleural apical thickening.  IMPRESSION: Findings compatible with mild CHF with cardiomegaly, small effusions and interstitial pulmonary edema.   Original Report Authenticated By: Andreas Newport, M.D.   Dg Knee Complete 4 Views Right 07/05/2012   *RADIOLOGY REPORT*  Clinical Data:  Pain  RIGHT KNEE - COMPLETE 4+ VIEW  Comparison: None.  Findings: Frontal, lateral, and bilateral oblique views were obtained.  There is no fracture, dislocation, or effusion.  There is slight narrowing medially.  No erosive change. There is arterial vascular calcification posteriorly.  IMPRESSION: Mild osteoarthritic change medially.  No fracture or effusion.   Original Report Authenticated By: Bretta Bang, M.D.   EKG: 05-Jul-2012 06:47:00  Sinus rhythm with 1st degree A-V block Vent. rate 68 BPM PR interval 246 ms QRS duration 88 ms QT/QTc 412/438 ms P-R-T axes 70 3 4  TEE: 07/04/2012 Study Conclusions Left atrium: No evidence of thrombus in the atrial cavity or appendage.  Left ventricle: Normal LV systolic function. Aortic valve: AV is thickened, calcified with moderately restricted motion. Trace AI. Aorta: MIld fixed atherosclerotic plaquing of the thoracic aorta. Mitral valve: MV is mildly thickened with mild prolapse of posterior mitral leaflet. Mild MR> Left atrium: No evidence of thrombus in the atrial cavity or appendage. Pulmonic valve: PV is normal. Tricuspid valve: TV is normal Mild to moderate TR> Post procedure conclusions Ascending Aorta: - MIld fixed atherosclerotic plaquing of the thoracic aorta.   FOLLOW UP PLANS AND APPOINTMENTS Allergies  Allergen Reactions  . Codeine Other (See Comments)    Makes her sick  . Sulfonamide Derivatives Itching and Rash     Medication List    STOP taking these medications       diltiazem 240 MG 24 hr capsule  Commonly known as:  CARDIZEM CD      TAKE these medications       apixaban 2.5 MG Tabs tablet  Commonly known as:  ELIQUIS  Take 1 tablet (2.5 mg total) by mouth 2 (two) times daily.     CALCIUM + D PO  Take 2 tablets by mouth 2 (two) times daily.     cyanocobalamin 500 MCG tablet  Take 500 mcg by mouth daily.     DSS 100 MG Caps  Take 100 mg by mouth 2 (two) times daily as needed for constipation.       EYE VITAMINS PO  Take 1 tablet by mouth daily.     furosemide 20 MG tablet  Commonly known as:  LASIX  Take 1 tablet (20 mg total) by mouth daily.     lansoprazole 30 MG disintegrating tablet  Commonly known as:  PREVACID SOLUTAB  Take 30 mg by mouth daily.     metoprolol 50 MG tablet  Commonly known as:  LOPRESSOR  Take 1 tablet (50 mg total) by mouth 2 (two) times daily.     zolpidem 10 MG tablet  Commonly known as:  AMBIEN  2.5 mg at bedtime as needed for sleep.         Future Appointments Provider Department Dept Phone   07/13/2012 12:00 PM [facility] Main Office [place]) [phone]   07/13/2012 3:15 PM [name], MD [facility] Main Office [place]) [phone]   07/20/2012 10:30 AM [facility] [phone]   09/30/2012 1:00 PM Mauri [place] [facility] [phone]   09/30/2012  1:30 PM Lennis Buzzy Han, MD [facility] MEDICAL ONCOLOG[facility] [phone]       BRING ALL MEDICATIONS WITH YOU TO FOLLOW UP APPOINTMENTS  Time spent with patient to include physician time: 41 min Signed: Theodore Demark, PA-C 07/05/2012, 11:56 AM Co-Sign MD

## 2012-07-05 NOTE — Progress Notes (Signed)
Discharge instructions given to pt and daughter.  Both verbalized understanding with all questions answered.  Pt discharged to home with daughter.  Roselie Awkward, RN

## 2012-07-05 NOTE — Progress Notes (Signed)
Subjective:  Remains in NSR with first degree block this am. Complains of right knee pain this am.  Objective:  Vital Signs in the last 24 hours: Temp:  [97.8 F (36.6 C)-99.3 F (37.4 C)] 98.7 F (37.1 C) (06/10 0340) Pulse Rate:  [62-99] 69 (06/10 0340) Resp:  [10-30] 23 (06/10 0340) BP: (95-161)/(39-74) 130/42 mmHg (06/10 0340) SpO2:  [87 %-100 %] 97 % (06/10 0340) Weight:  [134 lb 14.7 oz (61.2 kg)] 134 lb 14.7 oz (61.2 kg) (06/10 0539)  Intake/Output from previous day: 06/09 0701 - 06/10 0700 In: 300 [I.V.:300] Out: 900 [Urine:900] Intake/Output from this shift:    . apixaban  2.5 mg Oral BID  . cyanocobalamin  500 mcg Oral Daily  . docusate sodium  100 mg Oral BID  . furosemide  20 mg Oral Daily  . lansoprazole  30 mg Oral BID WC  . metoprolol tartrate  50 mg Oral BID  . sodium chloride  3 mL Intravenous Q12H   . sodium chloride 250 mL (07/04/12 1208)    Physical Exam: The patient appears to be in no distress.  Head and neck exam reveals that the pupils are equal and reactive.  The extraocular movements are full.  There is no scleral icterus.  Mouth and pharynx are benign.  No lymphadenopathy.  No carotid bruits.  The jugular venous pressure is normal.  Thyroid is not enlarged or tender.  Chest few fine rales.  Heart reveals no abnormal lift or heave.  First and second heart sounds are normal.  There is no   gallop rub or click. Soft systolic murmur at base.  The abdomen is soft and nontender.  Bowel sounds are normoactive.  There is no hepatosplenomegaly or mass.  There are no abdominal bruits.  Extremities reveal no phlebitis or edema.  Pedal pulses are good.  There is no cyanosis or clubbing. Feet are warm. DP pulses equal . Pain on flexing the knee.  Neurologic exam is normal strength and no lateralizing weakness.  No sensory deficits.  Integument reveals no rash  Lab Results:  Recent Labs  07/04/12 0535  WBC 6.8  HGB 9.1*  PLT 204    Recent  Labs  07/03/12 1424 07/04/12 0535  NA 136 139  K 4.3 4.1  CL 102 101  CO2 26 30  GLUCOSE 148* 107*  BUN 23 23  CREATININE 1.13* 1.05   No results found for this basename: TROPONINI, CK, MB,  in the last 72 hours Hepatic Function Panel No results found for this basename: PROT, ALBUMIN, AST, ALT, ALKPHOS, BILITOT, BILIDIR, IBILI,  in the last 72 hours No results found for this basename: CHOL,  in the last 72 hours No results found for this basename: PROTIME,  in the last 72 hours  Imaging: Dg Chest Port 1 View  07/03/2012   *RADIOLOGY REPORT*  Clinical Data: CHF.  Short of breath.  PORTABLE CHEST - 1 VIEW  Comparison: 06/30/2012.  Findings: Persistent cardiomegaly.  Aortic arch atherosclerosis. Calcification of the tracheobronchial tree.  Chronic symmetric pleural apical thickening.  Small left pleural effusion is present.  Probable small right pleural effusion present as well.  Mild basilar atelectasis.  No definite edema.  IMPRESSION: Cardiomegaly and small bilateral pleural effusions compatible with mild CHF.  No edema.   Original Report Authenticated By: Andreas Newport, M.D.    Cardiac Studies: EKG shows NSR with first degree block. Assessment/Plan:  1. Paroxysmal atrial fib now in NSR after successful TEE cardioversion.  2. Acute on chronic diastolic CHF 3. Chronic anemia. 4. Chest/throat discomfort probable anginal secondary to RVR, resolved. 5. Pain in right knee ?arthritis.  Will get xray of knee prior to discharge.  Plan: Home later today on eliquis and BB.  Continue low dose lasix. See for OV in about a week for EKG and BMET. Medical followup with Dr. Lysbeth Galas.  Keep appt with coumadin clinic to receive teaching re eliquis.    LOS: 5 days    [name] 07/05/2012, 7:39 AM

## 2012-07-06 ENCOUNTER — Encounter: Payer: Medicare Other | Admitting: Physician Assistant

## 2012-07-12 ENCOUNTER — Emergency Department (HOSPITAL_COMMUNITY)
Admission: EM | Admit: 2012-07-12 | Discharge: 2012-07-12 | Disposition: A | Discharge status: Home / Self Care | Payer: Medicare Other | Attending: Emergency Medicine | Admitting: Emergency Medicine

## 2012-07-12 ENCOUNTER — Encounter (HOSPITAL_COMMUNITY): Payer: Self-pay

## 2012-07-12 ENCOUNTER — Telehealth: Payer: Self-pay | Admitting: Cardiology

## 2012-07-12 DIAGNOSIS — I5031 Acute diastolic (congestive) heart failure: Secondary | ICD-10-CM | POA: Insufficient documentation

## 2012-07-12 DIAGNOSIS — R42 Dizziness and giddiness: Secondary | ICD-10-CM | POA: Insufficient documentation

## 2012-07-12 DIAGNOSIS — I10 Essential (primary) hypertension: Secondary | ICD-10-CM | POA: Insufficient documentation

## 2012-07-12 DIAGNOSIS — R062 Wheezing: Secondary | ICD-10-CM | POA: Insufficient documentation

## 2012-07-12 DIAGNOSIS — R109 Unspecified abdominal pain: Secondary | ICD-10-CM | POA: Insufficient documentation

## 2012-07-12 LAB — URINALYSIS, ROUTINE W REFLEX MICROSCOPIC
Glucose, UA: NEGATIVE mg/dL
Leukocytes, UA: NEGATIVE
Nitrite: NEGATIVE
Protein, ur: NEGATIVE mg/dL
pH: 6.5 (ref 5.0–8.0)

## 2012-07-12 NOTE — ED Notes (Signed)
Pt c/o abd fullness and feeling light headed starting approx 1500 today. Per daughter pt's BP was elevated today, pt wheezing, and increase urination. Daughter called pt's cardiologist they instructed her to take an additional metoprolol and lasix today

## 2012-07-12 NOTE — Telephone Encounter (Signed)
Dr. Shirlee Latch DOD recommends for pt to take an extra LASIX 20 MG AND AN EXTRA METOPROLOL 50 MG NOW. Pt is to go to the ER if does not get better. Pt's daughter Leeroy Bock, aware, she verbalized understanding.

## 2012-07-12 NOTE — Telephone Encounter (Signed)
Agree with recommendations of Dr. Shirlee Latch

## 2012-07-12 NOTE — Telephone Encounter (Signed)
Daughter called because  pt's BP was 212/99 on right arm 207/92 on left arm her pulse is 82 beats/minute an hour ago.Pt take metoprolol 50 mg twice a day. Daughter states pt C/O of being weak last week , so pt did not take the lasix 20 mg daily  for 4 day, from Friday till today. Pt's daughter took her B/P again now,left arm 216/103. Pt took the lasix 20 mg a few minute ago. Pt still feels weak and her chest feels like it is full.

## 2012-07-12 NOTE — Telephone Encounter (Signed)
New problem    Per pts daughter pts b/p is running 212/99 within the last hr-pulse 86

## 2012-07-13 ENCOUNTER — Ambulatory Visit (INDEPENDENT_AMBULATORY_CARE_PROVIDER_SITE_OTHER): Payer: Medicare Other | Admitting: Cardiology

## 2012-07-13 ENCOUNTER — Other Ambulatory Visit (INDEPENDENT_AMBULATORY_CARE_PROVIDER_SITE_OTHER): Payer: Medicare Other

## 2012-07-13 ENCOUNTER — Encounter: Payer: Self-pay | Admitting: Cardiology

## 2012-07-13 ENCOUNTER — Other Ambulatory Visit: Payer: Self-pay | Admitting: *Deleted

## 2012-07-13 VITALS — BP 148/73 | HR 73 | Ht 65.0 in | Wt 134.4 lb

## 2012-07-13 DIAGNOSIS — I4891 Unspecified atrial fibrillation: Secondary | ICD-10-CM

## 2012-07-13 DIAGNOSIS — I480 Paroxysmal atrial fibrillation: Secondary | ICD-10-CM

## 2012-07-13 DIAGNOSIS — I10 Essential (primary) hypertension: Secondary | ICD-10-CM

## 2012-07-13 DIAGNOSIS — Z7901 Long term (current) use of anticoagulants: Secondary | ICD-10-CM

## 2012-07-13 DIAGNOSIS — I350 Nonrheumatic aortic (valve) stenosis: Secondary | ICD-10-CM

## 2012-07-13 DIAGNOSIS — I359 Nonrheumatic aortic valve disorder, unspecified: Secondary | ICD-10-CM

## 2012-07-13 DIAGNOSIS — I119 Hypertensive heart disease without heart failure: Secondary | ICD-10-CM

## 2012-07-13 LAB — BASIC METABOLIC PANEL
Calcium: 8.6 mg/dL (ref 8.4–10.5)
Creatinine, Ser: 1 mg/dL (ref 0.4–1.2)
GFR: 58.83 mL/min — ABNORMAL LOW (ref >60.00)
Sodium: 136 mEq/L (ref 135–145)

## 2012-07-13 NOTE — Assessment & Plan Note (Signed)
Blood pressures at home have been somewhat erratic.  It turns out however that the patient had neglected to take her Lasix for the past 4 days.  We had her restart her Lasix yesterday and today her blood pressure is back in good range.  In the past she has had diastolic CHF with significant elevations of her proBNP.

## 2012-07-13 NOTE — Progress Notes (Signed)
Anna Elliott Date of Birth:  1921-11-26 [facility] [address] [place], [place]  [address] [phone]         Fax   [phone]  History of Present Illness: This pleasant 79 year old woman is seen back in the office after having been seen at [facility] for atrial fibrillation with rapid ventricular response.  She has had 2 recent admissions at [facility].  The more recent admission was from 06/30/12 until 07/05/12.  She was admitted at that time with atrial fibrillation with rapid ventricular response.  She was also noted to have known moderate aortic stenosis.  She had evidence of acute on chronic diastolic congestive heart failure.  She has a history of hypertension.  On 07/04/12 she underwent a TEE cardioversion.  She converted to normal sinus rhythm with 200 J synchronized biphasic energy.  She was discharged the next day maintaining sinus rhythm with first degree AV block.  She has been on Eliquis since her first admission.  She has a history of moderate valvular aortic stenosis.  Echocardiogram 06/23/12 showed an ejection fraction of 65% and moderate aortic stenosis with peak systolic gradient 42 and mean systolic gradient 23.  Current Outpatient Prescriptions  Medication Sig Dispense Refill  . apixaban (ELIQUIS) 2.5 MG TABS tablet Take 1 tablet (2.5 mg total) by mouth 2 (two) times daily.  60 tablet  3  . Calcium Carbonate-Vitamin D (CALCIUM + D PO) Take 2 tablets by mouth 2 (two) times daily.      . cyanocobalamin 500 MCG tablet Take 500 mcg by mouth daily.      Marland Kitchen docusate sodium 100 MG CAPS Take 100 mg by mouth 2 (two) times daily as needed for constipation.  10 capsule  0  . furosemide (LASIX) 20 MG tablet Take 1 tablet (20 mg total) by mouth daily.  30 tablet  6  . lansoprazole (PREVACID SOLUTAB) 30 MG disintegrating tablet Take 30 mg by mouth daily.      . metoprolol tartrate (LOPRESSOR) 50 MG tablet Take 1 tablet (50 mg total) by mouth 2 (two) times daily.  60  tablet  6  . Multiple Vitamins-Minerals (EYE VITAMINS PO) Take 1 tablet by mouth daily.      Marland Kitchen zolpidem (AMBIEN) 10 MG tablet 2.5 mg at bedtime as needed for sleep.        No current facility-administered medications for this visit.    Allergies  Allergen Reactions  . Asa (Aspirin)   . Codeine Other (See Comments)    Makes her sick  . Sulfonamide Derivatives Itching and Rash    Patient Active Problem List   Diagnosis Date Noted  . Atrial fibrillation with rapid ventricular response 07/05/2012  . Anticoagulated with Eliquis 07/05/2012  . Acute on chronic diastolic CHF (congestive heart failure), NYHA class 3 07/05/2012  . Acute diastolic heart failure   . Atrial fibrillation 06/22/2012  . Moderate aortic stenosis   . CHANGE IN BOWELS 11/14/2008  . FLATULENCE ERUCTATION AND GAS PAIN 10/29/2008  . DIARRHEA 10/29/2008  . ANEMIA, CHRONIC 05/14/2007  . HYPERTENSION 05/14/2007  . GERD 05/14/2007  . OTHER DYSPHAGIA 05/14/2007  . HIATAL HERNIA 09/10/2005  . DIVERTICULOSIS, COLON 08/02/1998  . ESOPHAGITIS 10/01/1987    History  Smoking status  . Never Smoker   Smokeless tobacco  . Never Used    History  Alcohol Use No    Family History  Problem Relation Age of Onset  . Heart failure Father   . Heart disease  Brother   . Diabetes Father   . Colon cancer Brother   . Lung cancer Sister   . Lymphoma Sister     Review of Systems: Constitutional: no fever chills diaphoresis or fatigue or change in weight.  Head and neck: no hearing loss, no epistaxis, no photophobia or visual disturbance. Respiratory: No cough, shortness of breath or wheezing. Cardiovascular: No chest pain peripheral edema, palpitations. Gastrointestinal: No abdominal distention, no abdominal pain, no change in bowel habits hematochezia or melena. Genitourinary: No dysuria, no frequency, no urgency, no nocturia. Musculoskeletal:No arthralgias, no back pain, no gait disturbance or myalgias. Neurological:  No dizziness, no headaches, no numbness, no seizures, no syncope, no weakness, no tremors. Hematologic: No lymphadenopathy, no easy bruising. Psychiatric: No confusion, no hallucinations, no sleep disturbance.    Physical Exam: Filed Vitals:   07/13/12 1449  BP: 148/73  Pulse: 73   the general appearance reveals a sprightly elderly woman in no distress.The head and neck exam reveals pupils equal and reactive.  Extraocular movements are full.  There is no scleral icterus.  The mouth and pharynx are normal.  The neck is supple.  The carotids reveal no bruits.  The jugular venous pressure is normal.  The  thyroid is not enlarged.  There is no lymphadenopathy.  The chest is clear to percussion and auscultation.  There are no rales or rhonchi.  Expansion of the chest is symmetrical.  The precordium is quiet.  The first heart sound is normal.  The second heart sound is physiologically split.  There is no  gallop rub or click.  There is a grade 3/6 harsh systolic ejection murmur across the aortic valve.  There is no diastolic murmur heard. There is no abnormal lift or heave.  The abdomen is soft and nontender.  The bowel sounds are normal.  The liver and spleen are not enlarged.  There are no abdominal masses.  There are no abdominal bruits.  Extremities reveal good pedal pulses.  There is no phlebitis or edema.  There is no cyanosis or clubbing.  Strength is normal and symmetrical in all extremities.  There is no lateralizing weakness.  There are no sensory deficits.  The skin is warm and dry.  There is no rash.   EKG today shows normal sinus rhythm with first degree AV block and PR interval 298 ms  Assessment / Plan: Continue same medication and be sure to take her Lasix every day.  Medical followup with Dr. Lysbeth Galas.  Recheck here in 2 months for office visit EKG basal metabolic panel and CBC

## 2012-07-13 NOTE — Assessment & Plan Note (Signed)
Since discharge from the hospital she has not been aware of any racing of her heart.  EKG today shows normal sinus rhythm.  She is on Lopressor 50 mg twice a day.

## 2012-07-13 NOTE — Progress Notes (Signed)
Quick Note:  Please report to patient. The recent labs are stable. Continue same medication and careful diet. Potassium is borderline low from the lasix. Add KDur 10 meq one tab BID ______

## 2012-07-13 NOTE — Patient Instructions (Addendum)
CONTINUE LASIX 20 MG DAILY  Your physician recommends that you schedule a follow-up appointment in: 2 MONTHS  WITH EKG  Your physician recommends that you return for lab work in: BMET CBC SAME DAY

## 2012-07-13 NOTE — Assessment & Plan Note (Signed)
The patient has not been experiencing any evidence of GI bleeding or excessive bruising from the Eliquis

## 2012-07-13 NOTE — Progress Notes (Signed)
LAB ORDER PLACED, PT IN LAB

## 2012-07-14 ENCOUNTER — Telehealth: Payer: Self-pay | Admitting: Cardiology

## 2012-07-14 MED ORDER — AMLODIPINE BESYLATE 5 MG PO TABS: 5.0000 mg | ORAL_TABLET | Freq: Every day | ORAL | Status: DC

## 2012-07-14 NOTE — Telephone Encounter (Signed)
Per daughter - states pt was just seen yesterday but she is concerned that afer any activity pt's BP is elevated.  This AM it was OK at 8:30 158/48 HR 74 but after going to get her hairdone at 1 pm it was left arm 181/75  HR 70 and right arm 174/72  HR 70.  This was taken approximately 30 mins after she had been back home.  Advised to continue current therapy and information will be send to Dr Patty Sermons for review.

## 2012-07-14 NOTE — Telephone Encounter (Signed)
Add amlodipine 5 mg daily for BP

## 2012-07-14 NOTE — Telephone Encounter (Signed)
I  Spoke with pt daughter and she agrees with starting Amlodipine 5mg  daily for pt blood pressure. She will keep a blood pressure record- checking once daily about 2 hours after taking her medications.  She will call back with blood pressure readings. Mylo Red RN

## 2012-07-14 NOTE — Telephone Encounter (Signed)
New Problem:    Called in wanting to speak with someone about her mother's BP going up.  Please call back.

## 2012-07-15 ENCOUNTER — Other Ambulatory Visit: Payer: Self-pay

## 2012-07-15 ENCOUNTER — Telehealth: Payer: Self-pay | Admitting: Cardiology

## 2012-07-15 MED ORDER — POTASSIUM CHLORIDE ER 10 MEQ PO TBCR: EXTENDED_RELEASE_TABLET | ORAL | Status: DC

## 2012-07-15 NOTE — Telephone Encounter (Signed)
Patient's daughter called today again with high BP readings. Pt is on Metoprolol 50 mg twice a day amlodipine 5 mg once a day and lasix 20 mg once a day. Pt's daughter said pt's BP at 8:00 AM before taken her medications was 139/66 at 2:00 Pm BP was 206/88 at 4:00 Pm BP 200/87. Pt's daughter thinks that some medications that pt is take is raising her BP

## 2012-07-15 NOTE — Telephone Encounter (Signed)
Dr. Eden Emms DOD aware of pt's BP reading and recommends for pt to take the Metoprolol 50 mg now. And start tomorow taken the Metoprolol in AM , Amlodipine in the afternoon, lasix 20 mg between the amlodipine and the PM Metoprolol. Pt's daughter verbalized understanding.

## 2012-07-15 NOTE — Telephone Encounter (Signed)
New problem    C/O B/p today   @ 4 pm . 200/87 taken in right arm.  Left arm  190 /78 .  No chestpain , no sob.     @ 8am  139/66. Before medication.   @ 2pm 206/88 after medication.

## 2012-07-18 ENCOUNTER — Telehealth: Payer: Self-pay | Admitting: Gastroenterology

## 2012-07-18 NOTE — Telephone Encounter (Signed)
Patient's daughter reports fullness , Feels like her food does not go down "fast enough".  She reports that she is not having problems swallowing.  She will come in and see Dr. [name] on 08/08/12

## 2012-07-20 ENCOUNTER — Ambulatory Visit (INDEPENDENT_AMBULATORY_CARE_PROVIDER_SITE_OTHER): Payer: Medicare Other | Admitting: *Deleted

## 2012-07-20 DIAGNOSIS — I4891 Unspecified atrial fibrillation: Secondary | ICD-10-CM

## 2012-07-20 LAB — CBC
Hemoglobin: 10.1 g/dL — ABNORMAL LOW (ref 12.0–15.0)
MCHC: 33.7 g/dL (ref 30.0–36.0)
MCV: 87.4 fl (ref 78.0–100.0)
Platelets: 271 10*3/uL (ref 150.0–400.0)
RBC: 3.44 Mil/uL — ABNORMAL LOW (ref 3.87–5.11)
WBC: 6.2 10*3/uL (ref 4.5–10.5)

## 2012-07-20 MED ORDER — APIXABAN 2.5 MG PO TABS: 2.5000 mg | ORAL_TABLET | Freq: Two times a day (BID) | ORAL | Status: DC

## 2012-07-20 NOTE — Progress Notes (Signed)
Pt was started on Eliquis 2.5mg  BID for Afib on 06/22/12.    Reviewed patients medication list.  Pt is not currently on any combined P-gp and strong CYP3A4 inhibitors/inducers (ketoconazole, traconazole, ritonavir, carbamazepine, phenytoin, rifampin, St. John's wort).  Reviewed labs.  SCr 1.0, Weight 59.36 Kg.   Dose is appropriate.   Hgb and HCT are 10.1/30 .  A full discussion of the nature of anticoagulants has been carried out.  A benefit/risk analysis has been presented to the patient, so that they understand the justification for choosing anticoagulation with Eliquis at this time.  The need for compliance is stressed.  Pt is aware to take the medication twice daily.  Side effects of potential bleeding are discussed, including unusual colored urine or stools, coughing up blood or coffee ground emesis, nose bleeds or serious fall or head trauma.  Discussed signs and symptoms of stroke. The patient should avoid any OTC items containing aspirin or ibuprofen.  Avoid alcohol consumption.   Call if any signs of abnormal bleeding.  Discussed financial obligations and resolved any difficulty in obtaining medication.  Next lab test test in 6 months on 01/24/13.

## 2012-07-21 ENCOUNTER — Telehealth: Payer: Self-pay | Admitting: *Deleted

## 2012-07-21 ENCOUNTER — Ambulatory Visit (INDEPENDENT_AMBULATORY_CARE_PROVIDER_SITE_OTHER): Payer: Medicare Other | Admitting: Gastroenterology

## 2012-07-21 ENCOUNTER — Encounter: Payer: Self-pay | Admitting: Gastroenterology

## 2012-07-21 VITALS — BP 138/58 | HR 62 | Ht 63.0 in | Wt 130.0 lb

## 2012-07-21 DIAGNOSIS — R195 Other fecal abnormalities: Secondary | ICD-10-CM

## 2012-07-21 DIAGNOSIS — D649 Anemia, unspecified: Secondary | ICD-10-CM | POA: Insufficient documentation

## 2012-07-21 DIAGNOSIS — K921 Melena: Secondary | ICD-10-CM | POA: Insufficient documentation

## 2012-07-21 DIAGNOSIS — Z7901 Long term (current) use of anticoagulants: Secondary | ICD-10-CM

## 2012-07-21 NOTE — Progress Notes (Signed)
07/21/2012 Anna Elliott [id number] [birth year]   HISTORY OF PRESENT ILLNESS:  Patient is an 79 year old female who is a patient of Dr. Ardell Isaacs.  She presents to our office today with her daughter regarding black stools that she has recently.  Patient had newly diagnosed atrial fibrillation with RVR and was place on Eliquis 5/28.  She had TEE cardioversion on 6/9.  She says that since beginning the Eliquis she has been having loose dark stools.  Feels like she has to go all the time but passes small amounts and does not feel evacuated.  She has chronic anemia for which she has followed with hematology, Dr. Donnie Coffin for several years.  Her Hgb has been stable in the 9-10 gram range dating back at least two years.  Hgb yesterday was 10.1 grams, which is actually improved from her reading previous to that.  Says that she was placed on potassium pills last week and since beginning those has no appetite.  She is not on iron supplements.  Has history of dysphagia with previous EGD/dil by Dr. [name] in 2007 when she had findings of GERD and hiatal hernia (was dilated empirically without stricture).  Says that the dysphagia has returned as well since beginning the Eliquis and is described as food moving very slowly at the lower part of her esophagus.  Stools have not been checked for blood and she denies seeing any red blood or maroon colored stools.  *Just of note, patient's daughter did most of the speaking during the visit despite the patient's ability to relay information.  Her daughter was very unpleasant and demanding.   Past Medical History  Diagnosis Date  . Hypertension   . Moderate aortic stenosis     a. Dx 2011. b. Echo 05/2012: EF 65%, mild LVH, mod AS mg , mildly dilated LA.  Marland Kitchen Chronic anemia     a. dating back to 2002 - BM bx: early mild dysplasia and/or sideroblastic changes.  Marland Kitchen Dysphagia   . GERD (gastroesophageal reflux disease)   . Esophagitis   . Hiatal hernia   . H/O: rheumatic fever      a. as a child  . Atrial fibrillation     a. Dx 05/2012 unknown duration, started on Eliqiuis.  . Acute diastolic heart failure    Past Surgical History  Procedure Laterality Date  . Tonsillectomy    . Cesarean section  1962  . Abdominal hysterectomy  1967  . Cataract extraction w/ intraocular lens  implant, bilateral Bilateral 1990's  . Esophageal dilation      "twice in her lifetime, I think; most recent was early 2000's" (06/22/2012)  . Tee without cardioversion N/A 07/04/2012    Procedure: TRANSESOPHAGEAL ECHOCARDIOGRAM (TEE);  Surgeon: Pricilla Riffle, MD;  Location: [facility] ENDOSCOPY;  Service: Cardiovascular;  Laterality: N/A;  talk to [facility]  . Cardioversion N/A 07/04/2012    Procedure: CARDIOVERSION;  Surgeon: Pricilla Riffle, MD;  Location: [facility] ENDOSCOPY;  Service: Cardiovascular;  Laterality: N/A;    reports that she has never smoked. She has never used smokeless tobacco. She reports that she does not drink alcohol or use illicit drugs. family history includes Colon cancer in her brother; Diabetes in her father; Heart disease in her brother; Heart failure in her father; Lung cancer in her sister; and Lymphoma in her sister. Allergies  Allergen Reactions  . Asa (Aspirin)   . Codeine Other (See Comments)    Makes her sick  . Sulfonamide Derivatives Itching and Rash  Outpatient Encounter Prescriptions as of 07/21/2012  Medication Sig Dispense Refill  . amLODipine (NORVASC) 5 MG tablet Take 1 tablet (5 mg total) by mouth daily.  180 tablet  3  . apixaban (ELIQUIS) 2.5 MG TABS tablet Take 1 tablet (2.5 mg total) by mouth 2 (two) times daily.  60 tablet  0  . Calcium Carbonate-Vitamin D (CALCIUM + D PO) Take 2 tablets by mouth 2 (two) times daily.      . cyanocobalamin 500 MCG tablet Take 500 mcg by mouth daily.      Marland Kitchen docusate sodium 100 MG CAPS Take 100 mg by mouth 2 (two) times daily as needed for constipation.  10 capsule  0  . furosemide (LASIX) 20 MG tablet Take 1 tablet (20 mg total)  by mouth daily.  30 tablet  6  . lansoprazole (PREVACID SOLUTAB) 30 MG disintegrating tablet Take 30 mg by mouth daily.      . metoprolol tartrate (LOPRESSOR) 50 MG tablet Take 1 tablet (50 mg total) by mouth 2 (two) times daily.  60 tablet  6  . Multiple Vitamins-Minerals (EYE VITAMINS PO) Take 1 tablet by mouth daily.      . potassium chloride (K-DUR) 10 MEQ tablet Take 10 meq twice a day.  60 tablet  6  . zolpidem (AMBIEN) 10 MG tablet 2.5 mg at bedtime as needed for sleep.       . [DISCONTINUED] apixaban (ELIQUIS) 2.5 MG TABS tablet Take 1 tablet (2.5 mg total) by mouth 2 (two) times daily.  60 tablet  3   No facility-administered encounter medications on file as of 07/21/2012.     REVIEW OF SYSTEMS  : All other systems reviewed and negative except where noted in the History of Present Illness.   PHYSICAL EXAM: BP 138/58  Pulse 62  Ht 5\' 3"  (1.6 m)  Wt 130 lb (58.968 kg)  BMI 23.03 kg/m2 General: Well developed elderly white female in no acute distress Head: Normocephalic and atraumatic Eyes:  sclerae anicteric,conjunctive pink. Ears: Normal auditory acuity Lungs: Clear throughout to auscultation Heart: Regular rate and rhythm;3/6 SEM noted Abdomen: Soft, nontender, non-distended. No masses or hepatomegaly noted. Normal bowel sounds. Rectal:  Deferred.  Hemoccults pending. Musculoskeletal: Symmetrical with no gross deformities  Skin: No lesions on visible extremities Extremities: No edema  Neurological: Alert oriented x 4, grossly nonfocal Psychological:  Alert and cooperative. Normal mood and affect  ASSESSMENT AND PLAN: -79 year old female with recent TEE cardioversion on 6/9 and initiation of Eliquis on 5/28.  Presents with black stools since beginning Eliquis.  Has chronic anemia, which has been followed by hematology for several years, and Hgb has been stable; most recent Hgb actually improved from previous.  At this time we are going to plan to check stools for occult  blood x 3.  I attempted to contact Dr. Patty Sermons regarding her case, however, he is out of town for the week and I will plan to speak with him next week.  This is a complicated situation and options would include no EGD with close monitoring of her Hgb vs diagnostic EGD only while she is on Eliquis vs therapeutic EGD while off of Eliquis.  Case discussed with Dr. Rhea Belton, however, ultimate decision regarding EGD would be made by Dr. [name].

## 2012-07-21 NOTE — Patient Instructions (Addendum)
Please go to the basement level to our lab for the stool study sample kit.  We will get back to you with the results.  We will call you about the Endoscopy.

## 2012-07-21 NOTE — Telephone Encounter (Signed)
I called and left a message and for the patient's daughter Kathryne Sharper.  I advised that Shanda Bumps the PA will call Dr. Patty Sermons on Monday 6-30 when he is back in the office. She will ask him about the Eliquis medication and the possibility of an EGD procedure for the patient.  Also she will send her note to Dr. [name] and we need to see what the stool study result shows.  We will contact her once we have more information.

## 2012-07-21 NOTE — Progress Notes (Signed)
Reviewed and agree with management plan. We need to clearly document GI blood loss before considering EGD or colonoscopy in this patient.  Await hemoccults.  Anna Elliott. [name], MD [facility]

## 2012-07-22 ENCOUNTER — Other Ambulatory Visit (INDEPENDENT_AMBULATORY_CARE_PROVIDER_SITE_OTHER): Payer: Medicare Other

## 2012-07-22 ENCOUNTER — Telehealth: Payer: Self-pay | Admitting: Gastroenterology

## 2012-07-22 DIAGNOSIS — D649 Anemia, unspecified: Secondary | ICD-10-CM

## 2012-07-22 LAB — FECAL OCCULT BLOOD, IMMUNOCHEMICAL: Fecal Occult Bld: NEGATIVE

## 2012-07-25 NOTE — Telephone Encounter (Signed)
Spoke with Gwenette Greet. IFOB is negative. Please, advise on f/u.

## 2012-07-26 ENCOUNTER — Telehealth: Payer: Self-pay | Admitting: Gastroenterology

## 2012-07-26 NOTE — Telephone Encounter (Signed)
Please inform patient's daughter that I have tried contacting Dr. Patty Sermons and have not heard back from him at this point.  Dr. [name], however, does not think that we should do any procedures unless there is overt evidence of GI bleeding.  Has heme negative stools and Hgb has been stable.  Would continue to have PCP monitor her blood counts for now and no procedures will be scheduled.  Thank you,  Jess

## 2012-07-26 NOTE — Telephone Encounter (Signed)
Spoke with patient's daughter and gave her recommendations as per Doug Sou, PA. Patient's daughter understands that patient is not bleeding from the results of stools and labs. However, she states the patient is having problems with dysphagia and wants to see Dr. [name] about this. She thought the patient had an OV on 08/08/12 but it looks as if this was cancelled when she saw [place]. She wants to reschedule her mother with Dr. [name] to discuss "stretching her esophagus." She states her mother needs this whether Dr. Patty Sermons says she can stop Eliquis or not. Patient's daughter was loudly insistent her mother be seen by Dr. [name]. Scheduled her on 08/08/12 at 2:45 to see Dr. [name].

## 2012-07-27 NOTE — Telephone Encounter (Signed)
Anna Elliott spoke with patient's daughter.  Scheduled for an appointment with Dr. [name].

## 2012-08-08 ENCOUNTER — Ambulatory Visit: Payer: Medicare Other | Admitting: Gastroenterology

## 2012-09-07 ENCOUNTER — Encounter: Payer: Self-pay | Admitting: Cardiology

## 2012-09-14 ENCOUNTER — Ambulatory Visit (INDEPENDENT_AMBULATORY_CARE_PROVIDER_SITE_OTHER): Payer: Self-pay | Admitting: Cardiology

## 2012-09-14 ENCOUNTER — Encounter: Payer: Self-pay | Admitting: Cardiology

## 2012-09-14 VITALS — BP 118/80 | HR 66 | Ht 65.0 in | Wt 132.0 lb

## 2012-09-14 DIAGNOSIS — I359 Nonrheumatic aortic valve disorder, unspecified: Secondary | ICD-10-CM

## 2012-09-14 DIAGNOSIS — I480 Paroxysmal atrial fibrillation: Secondary | ICD-10-CM

## 2012-09-14 DIAGNOSIS — I5031 Acute diastolic (congestive) heart failure: Secondary | ICD-10-CM

## 2012-09-14 DIAGNOSIS — I4891 Unspecified atrial fibrillation: Secondary | ICD-10-CM

## 2012-09-14 DIAGNOSIS — I350 Nonrheumatic aortic (valve) stenosis: Secondary | ICD-10-CM

## 2012-09-14 NOTE — Assessment & Plan Note (Signed)
The patient had previous acute diastolic heart failure secondary to rapid ventricular response to her atrial fib.  She is maintaining normal sinus rhythm now and is having no symptoms of CHF

## 2012-09-14 NOTE — Assessment & Plan Note (Signed)
The patient remains on beta blocker and on Eliquis.  She has had no recurrent atrial fibrillation.  She is not having any evidence of blood loss.  Recent hemoglobin on 09/06/12 at her PCP office was stable at 10.1.

## 2012-09-14 NOTE — Patient Instructions (Addendum)
Your physician recommends that you continue on your current medications as directed. Please refer to the Current Medication list given to you today.  Your physician recommends that you schedule a follow-up appointment in: 3 month ov/ekg 

## 2012-09-14 NOTE — Progress Notes (Signed)
Anna Elliott Date of Birth:  06-06-1921 [facility] [address] [place], [place]  [address] [phone]         Fax   [phone]  History of Present Illness: This pleasant 79 year old woman is seen for a scheduled followup office visit.  She previously had been seen at [facility] for atrial fibrillation with rapid ventricular response. She has had 2 recent admissions at [facility]. The more recent admission was from 06/30/12 until 07/05/12. She was admitted at that time with atrial fibrillation with rapid ventricular response. She was also noted to have known moderate aortic stenosis. She had evidence of acute on chronic diastolic congestive heart failure. She has a history of hypertension. On 07/04/12 she underwent a TEE cardioversion. She converted to normal sinus rhythm with 200 J synchronized biphasic energy. She was discharged the next day maintaining sinus rhythm with first degree AV block. She has been on Eliquis since her first admission. She has a history of moderate valvular aortic stenosis. Echocardiogram 06/23/12 showed an ejection fraction of 65% and moderate aortic stenosis with peak systolic gradient 42 and mean systolic gradient 23.  Since last visit she has had no new cardiac symptoms.  Current Outpatient Prescriptions  Medication Sig Dispense Refill  . amLODipine (NORVASC) 5 MG tablet Take 1 tablet (5 mg total) by mouth daily.  180 tablet  3  . apixaban (ELIQUIS) 2.5 MG TABS tablet Take 1 tablet (2.5 mg total) by mouth 2 (two) times daily.  60 tablet  0  . Calcium Carbonate-Vitamin D (CALCIUM + D PO) Take 2 tablets by mouth 2 (two) times daily.      . cyanocobalamin 500 MCG tablet Take 500 mcg by mouth daily.      Marland Kitchen docusate sodium 100 MG CAPS Take 100 mg by mouth 2 (two) times daily as needed for constipation.  10 capsule  0  . furosemide (LASIX) 20 MG tablet Take 1 tablet (20 mg total) by mouth daily.  30 tablet  6  . lansoprazole (PREVACID SOLUTAB) 30 MG  disintegrating tablet Take 30 mg by mouth daily.      . metoprolol tartrate (LOPRESSOR) 50 MG tablet Take 1 tablet (50 mg total) by mouth 2 (two) times daily.  60 tablet  6  . Multiple Vitamins-Minerals (EYE VITAMINS PO) Take 1 tablet by mouth daily.      Marland Kitchen zolpidem (AMBIEN) 10 MG tablet 2.5 mg at bedtime as needed for sleep.        No current facility-administered medications for this visit.    Allergies  Allergen Reactions  . Asa [Aspirin]   . Codeine Other (See Comments)    Makes her sick  . Potassium Chloride     Loss of appetite  . Sulfonamide Derivatives Itching and Rash    Patient Active Problem List   Diagnosis Date Noted  . Anemia 07/21/2012  . Black stools 07/21/2012  . Atrial fibrillation with rapid ventricular response 07/05/2012  . Anticoagulated with Eliquis 07/05/2012  . Acute on chronic diastolic CHF (congestive heart failure), NYHA class 3 07/05/2012  . Acute diastolic heart failure   . Atrial fibrillation 06/22/2012  . Moderate aortic stenosis   . CHANGE IN BOWELS 11/14/2008  . FLATULENCE ERUCTATION AND GAS PAIN 10/29/2008  . DIARRHEA 10/29/2008  . ANEMIA, CHRONIC 05/14/2007  . HYPERTENSION 05/14/2007  . GERD 05/14/2007  . OTHER DYSPHAGIA 05/14/2007  . HIATAL HERNIA 09/10/2005  . DIVERTICULOSIS, COLON 08/02/1998  . ESOPHAGITIS 10/01/1987    History  Smoking status  . Never Smoker   Smokeless tobacco  . Never Used    History  Alcohol Use No    Family History  Problem Relation Age of Onset  . Heart failure Father   . Heart disease Brother   . Diabetes Father   . Colon cancer Brother   . Lung cancer Sister   . Lymphoma Sister     Review of Systems: Constitutional: no fever chills diaphoresis or fatigue or change in weight.  Head and neck: no hearing loss, no epistaxis, no photophobia or visual disturbance. Respiratory: No cough, shortness of breath or wheezing. Cardiovascular: No chest pain peripheral edema,  palpitations. Gastrointestinal: No abdominal distention, no abdominal pain, no change in bowel habits hematochezia or melena. Genitourinary: No dysuria, no frequency, no urgency, no nocturia. Musculoskeletal:No arthralgias, no back pain, no gait disturbance or myalgias. Neurological: No dizziness, no headaches, no numbness, no seizures, no syncope, no weakness, no tremors. Hematologic: No lymphadenopathy, no easy bruising. Psychiatric: No confusion, no hallucinations, no sleep disturbance.    Physical Exam: Filed Vitals:   09/14/12 1425  BP: 118/80  Pulse: 66   the general appearance reveals a well-developed alert elderly woman in no distress.The head and neck exam reveals pupils equal and reactive.  Extraocular movements are full.  There is no scleral icterus.  The mouth and pharynx are normal.  The neck is supple.  The carotids reveal no bruits.  The jugular venous pressure is normal.  The  thyroid is not enlarged.  There is no lymphadenopathy.  The chest is clear to percussion and auscultation.  There are no rales or rhonchi.  Expansion of the chest is symmetrical.  The precordium is quiet.  The first heart sound is normal.  The second heart sound is physiologically split.  There is no murmur gallop rub or click.  There is no abnormal lift or heave.  The abdomen is soft and nontender.  The bowel sounds are normal.  The liver and spleen are not enlarged.  There are no abdominal masses.  There are no abdominal bruits.  Extremities reveal good pedal pulses.  There is no phlebitis or edema.  There is no cyanosis or clubbing.  Strength is normal and symmetrical in all extremities.  There is no lateralizing weakness.  There are no sensory deficits.  The skin is warm and dry.  There is no rash.  EKG today shows sinus rhythm with first degree AV block and is unchanged since 07/13/12.  Assessment / Plan: The patient is doing well.  Continue same medication.  Recheck in 3 months for office visit and  EKG.

## 2012-09-14 NOTE — Assessment & Plan Note (Signed)
The patient has known moderate aortic stenosis.  Her mean gradient is 23 by echocardiogram 06/23/12.  She is not having any symptoms from her aortic stenosis.  Continue to follow up

## 2012-09-29 ENCOUNTER — Other Ambulatory Visit: Payer: Self-pay | Admitting: Medical Oncology

## 2012-09-29 DIAGNOSIS — D649 Anemia, unspecified: Secondary | ICD-10-CM

## 2012-09-30 ENCOUNTER — Ambulatory Visit (HOSPITAL_BASED_OUTPATIENT_CLINIC_OR_DEPARTMENT_OTHER): Payer: Medicare Other | Admitting: Internal Medicine

## 2012-09-30 ENCOUNTER — Encounter: Payer: Self-pay | Admitting: Internal Medicine

## 2012-09-30 ENCOUNTER — Other Ambulatory Visit (HOSPITAL_BASED_OUTPATIENT_CLINIC_OR_DEPARTMENT_OTHER): Payer: Medicare Other

## 2012-09-30 ENCOUNTER — Telehealth: Payer: Self-pay | Admitting: Internal Medicine

## 2012-09-30 VITALS — BP 106/68 | HR 73 | Temp 97.8°F | Resp 19 | Ht 65.0 in | Wt 131.7 lb

## 2012-09-30 DIAGNOSIS — D4620 Refractory anemia with excess of blasts, unspecified: Secondary | ICD-10-CM

## 2012-09-30 DIAGNOSIS — D649 Anemia, unspecified: Secondary | ICD-10-CM

## 2012-09-30 DIAGNOSIS — D539 Nutritional anemia, unspecified: Secondary | ICD-10-CM

## 2012-09-30 LAB — CBC WITH DIFFERENTIAL/PLATELET
Basophils Absolute: 0 10*3/uL (ref 0.0–0.1)
EOS%: 1.2 % (ref 0.0–7.0)
HGB: 10.2 g/dL — ABNORMAL LOW (ref 11.6–15.9)
MCH: 30 pg (ref 25.1–34.0)
NEUT#: 4 10*3/uL (ref 1.5–6.5)
RDW: 16.8 % — ABNORMAL HIGH (ref 11.2–14.5)
lymph#: 0.9 10*3/uL (ref 0.9–3.3)

## 2012-09-30 LAB — COMPREHENSIVE METABOLIC PANEL (CC13)
ALT: 7 U/L (ref 0–55)
AST: 11 U/L (ref 5–34)
Albumin: 3.8 g/dL (ref 3.5–5.0)
BUN: 33.7 mg/dL — ABNORMAL HIGH (ref 7.0–26.0)
Calcium: 8.9 mg/dL (ref 8.4–10.4)
Chloride: 108 mEq/L (ref 98–109)
Potassium: 4 mEq/L (ref 3.5–5.1)
Sodium: 142 mEq/L (ref 136–145)
Total Protein: 6.6 g/dL (ref 6.4–8.3)

## 2012-09-30 NOTE — Telephone Encounter (Signed)
Gave pt appt for lab and MD on September 2014 °

## 2012-09-30 NOTE — Progress Notes (Signed)
Hematology and Oncology Follow Up Visit  Anna Elliott [id number] [birth year] 79 y.o. 10/02/2012 2:46 PM Josue Hector, MD  Principle Diagnosis: Chronic anemia  Prior Therapy: Vitamin B12  Current therapy: Vitamin B12  Interim History: Patient is a pleasant 79 year female who comes for follow-up for her chronic anemia.  She was last seen by Dr. Pierce Crane on 10/08/2011. She has no complaints and reports doing very well.  She has not required any transfusions.  She denies symptoms of anemia such as dyspnea on exertion, palpitations.    Medications: I have reviewed the patient's current medications.  Current Outpatient Prescriptions  Medication Sig Dispense Refill  . amLODipine (NORVASC) 5 MG tablet Take 1 tablet (5 mg total) by mouth daily.  180 tablet  3  . apixaban (ELIQUIS) 2.5 MG TABS tablet Take 1 tablet (2.5 mg total) by mouth 2 (two) times daily.  60 tablet  0  . Calcium Carbonate-Vitamin D (CALCIUM + D PO) Take 2 tablets by mouth 2 (two) times daily.      . cyanocobalamin 500 MCG tablet Take 500 mcg by mouth daily.      . furosemide (LASIX) 20 MG tablet Take 1 tablet (20 mg total) by mouth daily.  30 tablet  6  . lansoprazole (PREVACID SOLUTAB) 30 MG disintegrating tablet Take 30 mg by mouth daily.      . metoprolol tartrate (LOPRESSOR) 50 MG tablet Take 1 tablet (50 mg total) by mouth 2 (two) times daily.  60 tablet  6  . Multiple Vitamins-Minerals (EYE VITAMINS PO) Take 1 tablet by mouth daily.      Marland Kitchen zolpidem (AMBIEN) 10 MG tablet 2.5 mg at bedtime as needed for sleep.        No current facility-administered medications for this visit.     Allergies:  Allergies  Allergen Reactions  . Asa [Aspirin]   . Codeine Other (See Comments)    Makes her sick  . Potassium Chloride     Loss of appetite  . Sulfonamide Derivatives Itching and Rash    Past Medical History, Surgical history, Social history, and Family History were reviewed and updated.  Review of  Systems: Constitutional:  Negative for fever, chills, night sweats, anorexia, weight loss, pain. Cardiovascular: no chest pain or dyspnea on exertion Respiratory: no cough, shortness of breath, or wheezing Neurological: no TIA or stroke symptoms Dermatological: negative for rash ENT: negative for - epistaxis Skin: Negative. Gastrointestinal: no abdominal pain, change in bowel habits, or black or bloody stools Genito-Urinary: no dysuria, trouble voiding, or hematuria Hematological and Lymphatic: negative for - bleeding problems Breast: negative for breast lumps Musculoskeletal: negative for - gait disturbance Remaining ROS negative. Physical Exam: Blood pressure 106/68, pulse 73, temperature 97.8 F (36.6 C), temperature source Oral, resp. rate 19, height 5\' 5"  (1.651 m), weight 131 lb 11.2 oz (59.739 kg). ECOG: 0 General appearance: alert, cooperative, appears stated age and no distress Head: Normocephalic, without obvious abnormality, atraumatic Neck: no adenopathy, supple, symmetrical, trachea midline and thyroid not enlarged, symmetric, no tenderness/mass/nodules Lymph nodes: Cervical, supraclavicular, and axillary nodes normal. Heart:regular rate and rhythm, S1, S2 normal and systolic murmur: systolic ejection 2/6, harsh without radiation (patient reports a history of murmur) Lung:chest clear, no wheezing, rales, normal symmetric air entry, Heart exam - S1, S2 normal, no murmur, no gallop, rate regular Abdomin: soft, non-tender, without masses or organomegaly EXT:No peripheral edema   Lab Results: Lab Results  Component Value Date   WBC 5.4 09/30/2012  HGB 10.2* 09/30/2012   HCT 29.9* 09/30/2012   MCV 87.9 09/30/2012   PLT 194 09/30/2012     Chemistry      Component Value Date/Time   NA 142 09/30/2012 1307   NA 136 07/13/2012 1210   K 4.0 09/30/2012 1307   K 3.5 07/13/2012 1210   CL 96 07/13/2012 1210   CO2 25 09/30/2012 1307   CO2 28 07/13/2012 1210   BUN 33.7* 09/30/2012 1307   BUN  20 07/13/2012 1210   CREATININE 1.0 09/30/2012 1307   CREATININE 1.0 07/13/2012 1210      Component Value Date/Time   CALCIUM 8.9 09/30/2012 1307   CALCIUM 8.6 07/13/2012 1210   ALKPHOS 64 09/30/2012 1307   ALKPHOS 52 06/23/2012 0540   AST 11 09/30/2012 1307   AST 16 06/23/2012 0540   ALT 7 09/30/2012 1307   ALT 12 06/23/2012 0540   BILITOT 0.91 09/30/2012 1307   BILITOT 0.9 06/23/2012 0540       Radiological Studies: No results found.   Impression and Plan: 1. History of refractory anemia dating back to 2002 when a bone marrow biopsy was performed. This did show evidence of perhaps early mild dysplasia and/or sideroblastic changes.  She has not required transfusions and has been stable over the years. I will continue to monitor her on an annual basis. No intervention currently required.  Labs reviewed and hemogloblin stable.   Spent more than half the time coordinating care.    Arcenia Scarbro, MD 9/7/20142:46 PM

## 2012-10-06 ENCOUNTER — Other Ambulatory Visit (HOSPITAL_COMMUNITY): Payer: Self-pay | Admitting: Physician Assistant

## 2012-10-07 ENCOUNTER — Ambulatory Visit: Payer: Medicare Other | Admitting: Oncology

## 2012-10-07 ENCOUNTER — Other Ambulatory Visit: Payer: Medicare Other | Admitting: Lab

## 2012-10-11 ENCOUNTER — Ambulatory Visit: Payer: Medicare Other | Admitting: Internal Medicine

## 2012-10-11 ENCOUNTER — Other Ambulatory Visit: Payer: Medicare Other | Admitting: Lab

## 2012-11-16 ENCOUNTER — Other Ambulatory Visit: Payer: Self-pay | Admitting: Cardiology

## 2012-12-13 ENCOUNTER — Encounter: Payer: Self-pay | Admitting: Cardiology

## 2012-12-13 ENCOUNTER — Ambulatory Visit (INDEPENDENT_AMBULATORY_CARE_PROVIDER_SITE_OTHER): Payer: Medicare Other | Admitting: Cardiology

## 2012-12-13 VITALS — BP 154/83 | HR 70 | Ht 65.0 in | Wt 131.0 lb

## 2012-12-13 DIAGNOSIS — I4891 Unspecified atrial fibrillation: Secondary | ICD-10-CM

## 2012-12-13 DIAGNOSIS — I119 Hypertensive heart disease without heart failure: Secondary | ICD-10-CM

## 2012-12-13 DIAGNOSIS — I359 Nonrheumatic aortic valve disorder, unspecified: Secondary | ICD-10-CM

## 2012-12-13 DIAGNOSIS — I350 Nonrheumatic aortic (valve) stenosis: Secondary | ICD-10-CM

## 2012-12-13 DIAGNOSIS — K219 Gastro-esophageal reflux disease without esophagitis: Secondary | ICD-10-CM

## 2012-12-13 DIAGNOSIS — I480 Paroxysmal atrial fibrillation: Secondary | ICD-10-CM

## 2012-12-13 MED ORDER — FUROSEMIDE 20 MG PO TABS: 20.0000 mg | ORAL_TABLET | Freq: Every day | ORAL | Status: DC

## 2012-12-13 MED ORDER — APIXABAN 2.5 MG PO TABS: ORAL_TABLET | ORAL | Status: DC

## 2012-12-13 MED ORDER — METOPROLOL TARTRATE 50 MG PO TABS: 50.0000 mg | ORAL_TABLET | Freq: Two times a day (BID) | ORAL | Status: DC

## 2012-12-13 MED ORDER — AMLODIPINE BESYLATE 5 MG PO TABS: 5.0000 mg | ORAL_TABLET | Freq: Every day | ORAL | Status: DC

## 2012-12-13 NOTE — Progress Notes (Signed)
Anna Elliott Date of Birth:  17-Jul-1921 [address] [facility] [address] [place], [place]  [address] [phone]         Fax   [phone]  History of Present Illness: This pleasant 79 year old woman is seen for a scheduled followup office visit.  She previously had been seen at [facility] for atrial fibrillation with rapid ventricular response. The more recent admission was from 06/30/12 until 07/05/12. She was admitted at that time with atrial fibrillation with rapid ventricular response. She was also noted to have known moderate aortic stenosis. She had evidence of acute on chronic diastolic congestive heart failure. She has a history of hypertension. On 07/04/12 she underwent a TEE cardioversion. She converted to normal sinus rhythm with 200 J synchronized biphasic energy. She was discharged the next day maintaining sinus rhythm with first degree AV block. She has been on Eliquis since her first admission. She has a history of moderate valvular aortic stenosis. Echocardiogram 06/23/12 showed an ejection fraction of 65% and moderate aortic stenosis with peak systolic gradient 42 and mean systolic gradient 23.  Since last visit she has had no new cardiac symptoms.  Current Outpatient Prescriptions  Medication Sig Dispense Refill  . amLODipine (NORVASC) 5 MG tablet Take 1 tablet (5 mg total) by mouth daily.  90 tablet  3  . apixaban (ELIQUIS) 2.5 MG TABS tablet TAKE 1 TABLET (2.5 MG TOTAL) BY MOUTH 2 (TWO) TIMES DAILY.  180 tablet  3  . Calcium Carbonate-Vitamin D (CALCIUM + D PO) Take 2 tablets by mouth 2 (two) times daily.      . cyanocobalamin 500 MCG tablet Take 500 mcg by mouth daily.      . furosemide (LASIX) 20 MG tablet Take 1 tablet (20 mg total) by mouth daily.  90 tablet  3  . lansoprazole (PREVACID SOLUTAB) 30 MG disintegrating tablet Take 30 mg by mouth daily.      . metoprolol (LOPRESSOR) 50 MG tablet Take 1 tablet (50 mg total) by mouth 2 (two) times daily.  180 tablet  3  .  Multiple Vitamins-Minerals (EYE VITAMINS PO) Take 1 tablet by mouth daily.      Marland Kitchen zolpidem (AMBIEN) 10 MG tablet 2.5 mg at bedtime as needed for sleep.        No current facility-administered medications for this visit.    Allergies  Allergen Reactions  . Asa [Aspirin]   . Codeine Other (See Comments)    Makes her sick  . Potassium Chloride     Loss of appetite  . Sulfonamide Derivatives Itching and Rash    Patient Active Problem List   Diagnosis Date Noted  . Anemia 07/21/2012  . Black stools 07/21/2012  . Atrial fibrillation with rapid ventricular response 07/05/2012  . Anticoagulated with Eliquis 07/05/2012  . Acute on chronic diastolic CHF (congestive heart failure), NYHA class 3 07/05/2012  . Acute diastolic heart failure   . Atrial fibrillation 06/22/2012  . Moderate aortic stenosis   . CHANGE IN BOWELS 11/14/2008  . FLATULENCE ERUCTATION AND GAS PAIN 10/29/2008  . DIARRHEA 10/29/2008  . ANEMIA, CHRONIC 05/14/2007  . HYPERTENSION 05/14/2007  . GERD 05/14/2007  . OTHER DYSPHAGIA 05/14/2007  . HIATAL HERNIA 09/10/2005  . DIVERTICULOSIS, COLON 08/02/1998  . ESOPHAGITIS 10/01/1987    History  Smoking status  . Never Smoker   Smokeless tobacco  . Never Used    History  Alcohol Use No    Family History  Problem Relation Age of Onset  .  Heart failure Father   . Heart disease Brother   . Diabetes Father   . Colon cancer Brother   . Lung cancer Sister   . Lymphoma Sister     Review of Systems: Constitutional: no fever chills diaphoresis or fatigue or change in weight.  Head and neck: no hearing loss, no epistaxis, no photophobia or visual disturbance. Respiratory: No cough, shortness of breath or wheezing. Cardiovascular: No chest pain peripheral edema, palpitations. Gastrointestinal: No abdominal distention, no abdominal pain, no change in bowel habits hematochezia or melena. Genitourinary: No dysuria, no frequency, no urgency, no  nocturia. Musculoskeletal:No arthralgias, no back pain, no gait disturbance or myalgias. Neurological: No dizziness, no headaches, no numbness, no seizures, no syncope, no weakness, no tremors. Hematologic: No lymphadenopathy, no easy bruising. Psychiatric: No confusion, no hallucinations, no sleep disturbance.    Physical Exam: Filed Vitals:   12/13/12 1446  BP: 154/83  Pulse: 70   the general appearance reveals a well-developed alert elderly woman in no distress.The head and neck exam reveals pupils equal and reactive.  Extraocular movements are full.  There is no scleral icterus.  The mouth and pharynx are normal.  The neck is supple.  The carotids reveal no bruits.  The jugular venous pressure is normal.  The  thyroid is not enlarged.  There is no lymphadenopathy.  The chest is clear to percussion and auscultation.  There are no rales or rhonchi.  Expansion of the chest is symmetrical.  The precordium is quiet.  The first heart sound is normal.  The second heart sound is physiologically split.  There is a grade 2/6 systolic ejection murmur at the base.  There is no abnormal lift or heave.  The abdomen is soft and nontender.  The bowel sounds are normal.  The liver and spleen are not enlarged.  There are no abdominal masses.  There are no abdominal bruits.  Extremities reveal good pedal pulses.  There is no phlebitis or edema.  There is no cyanosis or clubbing.  Strength is normal and symmetrical in all extremities.  There is no lateralizing weakness.  There are no sensory deficits.  The skin is warm and dry.  There is no rash.  EKG today shows sinus rhythm with first degree AV block and is unchanged since prior tracing.  Assessment / Plan: The patient is doing well.  Continue same medication.  Recheck in 4 months for followup office visit and EKG

## 2012-12-13 NOTE — Patient Instructions (Signed)
Your physician recommends that you continue on your current medications as directed. Please refer to the Current Medication list given to you today.  Your physician recommends that you schedule a follow-up appointment in: 4 MONTH OV/EKG  

## 2012-12-13 NOTE — Assessment & Plan Note (Signed)
The patient has not been experiencing any symptoms from her aortic valve disease.  No dizziness or syncope.  No chest pain.  No symptoms of CHF.

## 2012-12-13 NOTE — Assessment & Plan Note (Signed)
Her appetite is good and she is not having any current symptoms of dyspepsia

## 2012-12-13 NOTE — Assessment & Plan Note (Signed)
The patient has had no recurrence of her atrial fibrillation since last visit.  She is not having any side effects or complications from her anticoagulation with Eliquis.  No hematochezia or melena

## 2013-01-24 ENCOUNTER — Ambulatory Visit (INDEPENDENT_AMBULATORY_CARE_PROVIDER_SITE_OTHER): Payer: Medicare Other | Admitting: *Deleted

## 2013-01-24 DIAGNOSIS — I4891 Unspecified atrial fibrillation: Secondary | ICD-10-CM

## 2013-01-24 LAB — CBC
Hemoglobin: 9.6 g/dL — ABNORMAL LOW (ref 12.0–15.0)
RDW: 16.6 % — ABNORMAL HIGH (ref 11.5–14.6)
WBC: 5.5 10*3/uL (ref 4.5–10.5)

## 2013-01-24 LAB — BASIC METABOLIC PANEL
CO2: 30 mEq/L (ref 19–32)
Chloride: 102 mEq/L (ref 96–112)
Creatinine, Ser: 1 mg/dL (ref 0.4–1.2)
GFR: 54.75 mL/min — ABNORMAL LOW (ref >60.00)

## 2013-01-24 NOTE — Progress Notes (Signed)
Pt was started on Eliquis 2.5mg  bid  for Atrial Fib on May 28,2014.    Reviewed patients medication list.  Pt is not currently on any combined P-gp and strong CYP3A4 inhibitors/inducers (ketoconazole, traconazole, ritonavir, carbamazepine, phenytoin, rifampin, St. John's wort).  Reviewed labs.  SCr 1.0 , Weight 60.81kg  Dose is appropriate based onlabs.   Hgb  9.6 and HCT 28.9  A full discussion of the nature of anticoagulants has been carried out.  A benefit/risk analysis has been presented to the patient, so that they understand the justification for choosing anticoagulation with Eliquis at this time.  The need for compliance is stressed.  Pt is aware to take the medication twice daily.  Side effects of potential bleeding are discussed, including unusual colored urine or stools, coughing up blood or coffee ground emesis, nose bleeds or serious fall or head trauma.  Discussed signs and symptoms of stroke. The patient should avoid any OTC items containing aspirin or ibuprofen.  Avoid alcohol consumption.   Call if any signs of abnormal bleeding. Had not had any problems obtaining Eliquis until received notice from [facility] that they would not be covering Eliquis in the 2015  Formulary  Discussed financial obligations .  Next lab test test in 6 months.  01/25/2013 Called and spoke with pt that she is on the correct dose of Eliquis 2.5mg  bid and wanted to make her 6 month appt but she states she wants to talk with her daughter before makes that appt Will call daughter on Friday January 2nd

## 2013-01-24 NOTE — Progress Notes (Signed)
Quick Note:  Please report to patient. The recent labs are stable. Continue same medication and careful diet. Mild anemia about the same. Continue multivitamins. ______

## 2013-01-25 ENCOUNTER — Telehealth: Payer: Self-pay | Admitting: *Deleted

## 2013-01-25 NOTE — Telephone Encounter (Signed)
Message copied by Jeannine Kitten on Wed Jan 25, 2013  9:50 AM ------      Message from: [name]      Created: Tue Jan 24, 2013  4:39 PM       She can stay on eliquis if the family is willing to pay the extra amount. Otherwise it seems to me that she would have to try the xarelto and then if she does not tolerate the xarelto we would have something to make an appeal to the insurance company for an exception.  Just the fact that she is doing so well on the eliquis would not make any difference to the insurance company.      TB      ----- Message -----         From: Jeannine Kitten, RN         Sent: 01/24/2013   2:39 PM           To: Laveda Norman, MD            Pt here for 6 month follow up Eliquis . Has been tolerating this well Received notice from [employer] that Eliquis will not be on 2015 formulary and want her to change to Xarelto. Pt and her daughter do not want her changed to Xarelto as she is tolerating Eliquis so well Please advise and call  Micki Riley pt's daughter regarding this      Thank you       Lelon Perla RN       ------

## 2013-01-25 NOTE — Telephone Encounter (Signed)
Called and spoke with pt and instructed to continue on same dose of Eliquis 2.5mg  bid and also informed regarding message from Dr Patty Sermons and that he did say her anemia remains about the same  and  to continue her medications as ordered and to continue her multivitamins as ordered and pt states understanding. Pt states she wants her daughter called regarding this on Friday as she is out of town at present  Informed pt that Dr Patty Sermons states she can stay on Eliquis if family willing to pay or may try Xarelto and then if does not tolerate would have something to appeal to insurance company

## 2013-01-27 ENCOUNTER — Other Ambulatory Visit (HOSPITAL_COMMUNITY): Payer: Self-pay | Admitting: Physician Assistant

## 2013-01-27 ENCOUNTER — Telehealth: Payer: Self-pay | Admitting: *Deleted

## 2013-01-27 NOTE — Telephone Encounter (Signed)
Pt's  daughter is very concerned about her Mother having to switch to Xarelto and will call and talk with Dr Mare Ferrari next week regarding this. Appt made for pt to rechecked in 6 months if she is still on Eliquis

## 2013-01-27 NOTE — Telephone Encounter (Signed)
Talked with pt's daughter at length and gave her the same information that I talked with her Mother regarding her Eliquis dose being correct due to her labs findings and the note returned from Dr Mare Ferrari regarding insurance information. The daughter states she has been on phone to [facility] and this nurse instructed her to call Dr Mare Ferrari next week and discuss these matters with him and she states she will do so

## 2013-01-30 ENCOUNTER — Telehealth: Payer: Self-pay | Admitting: *Deleted

## 2013-01-30 NOTE — Telephone Encounter (Signed)
Today initiated non formulary PA for eliquis, [facility] formulary calls for xarelto.

## 2013-01-30 NOTE — Telephone Encounter (Signed)
Received call back from [facility], they state patient 2015 plan does have eliquis on formulary, she can get up to 60 tablet for 1 month, I called and informed patient.

## 2013-02-13 ENCOUNTER — Telehealth: Payer: Self-pay | Admitting: Cardiology

## 2013-02-13 NOTE — Telephone Encounter (Signed)
Patient seen at PCP last week and Hgb down to 8.2 from 9.7. Patient was advised to get some iron daily and recheck labs on Wednesday. Patient is having decreased appetite. Per daughter patient decreased her Metoprolol to 1/2 tablet twice a day. PCP last week decrease Lasix to 1/2 tablet daily. Stool cards turned into PCP today and still no results at this time per patient. No dark stools per daughter. Will follow up with daughter in am.

## 2013-02-13 NOTE — Telephone Encounter (Signed)
New message     Pt is going to an GI doctor because of bleeding----she is on eliquis.  Daughter want to talk to someone about possible stopping this medication---if so will she start something else?

## 2013-02-14 ENCOUNTER — Telehealth: Payer: Self-pay | Admitting: Gastroenterology

## 2013-02-14 NOTE — Telephone Encounter (Signed)
Follow up    Returning  Call back to nurse.

## 2013-02-14 NOTE — Telephone Encounter (Signed)
Daughter called back regarding bruising on patients arms. Explained that bruising unfortunately is a side effect of this medication but needs to be protected with her Afib. Daughter very concerned stating that she has not hit her self they have just come up. Advised  Dr. Mare Ferrari out of the office but would discuss with DOD Dr Acie Fredrickson. Discussed with Dr Acie Fredrickson and recommended continuing current therapy and follow up with  Dr. Mare Ferrari.  Daughter did get patient an appointment with Dr Fuller Plan tomorrow.

## 2013-02-14 NOTE — Telephone Encounter (Signed)
She needs to continue apixaban to protect her from stroke.

## 2013-02-14 NOTE — Telephone Encounter (Signed)
Spoke with daughter and they did hear back on stool cards, they were negative. Patient is having bright yellow stools and does have follow up labs at PCP tomorrow. Daughter concerned that patient has decreased appetite, bowel issues (constipated one day, loose bowels next day) and drop in Hgb, she is going to call Dr Fuller Plan for follow up appointment. Requested that she call back and give [place] Hgb on Thursday when she gets results. Will then forward to  Dr. Mare Ferrari for review.

## 2013-02-14 NOTE — Telephone Encounter (Signed)
Daughter reports that her mother has loose yellow stool, weight loss and not able to eat.  Daughter reports that Dr. Edrick Oh was supposed to be setting her up a referral.  I have scheduled her to see Dr. Fuller Plan 02/15/13 2:00.  Daughter is advised that she will need to contact Dr. Murrell Redden office to send records.  I spoke with Dr. Murrell Redden office and they will send records

## 2013-02-15 ENCOUNTER — Encounter: Payer: Self-pay | Admitting: Gastroenterology

## 2013-02-15 ENCOUNTER — Ambulatory Visit (INDEPENDENT_AMBULATORY_CARE_PROVIDER_SITE_OTHER): Payer: Medicare Other | Admitting: Gastroenterology

## 2013-02-15 VITALS — BP 150/70 | HR 76 | Ht 65.0 in | Wt 129.0 lb

## 2013-02-15 DIAGNOSIS — R198 Other specified symptoms and signs involving the digestive system and abdomen: Secondary | ICD-10-CM

## 2013-02-15 DIAGNOSIS — R1319 Other dysphagia: Secondary | ICD-10-CM

## 2013-02-15 DIAGNOSIS — D509 Iron deficiency anemia, unspecified: Secondary | ICD-10-CM

## 2013-02-15 NOTE — Telephone Encounter (Signed)
Advised daughter 

## 2013-02-15 NOTE — Progress Notes (Signed)
    History of Present Illness: This is an 80 year old female accompanied by her daughter. She was recently found to have a worsening anemia by Dr. Edrick Oh. Hemoglobin 8.4, with a low iron saturation of 13%, TIBC was also low at 227 and iron low at 30. She relates his slight decrease in appetite over the past few weeks. The daughter relates that she may have lost 4 pounds however her weight here was 130 pounds in June and it was 58 today. She also notes a slight decrease in appetite and since that time she has noted a slight variation in her bowel habits with mushy stools and sometimes balls of stool. She last underwent colonoscopy in 11/2008 showing moderate diverticulosis internal hemorrhoids and 2 small adenomatous colon polyps. Her last EGD was in 08/2005 performed for dysphagia. A hiatal hernia was noted and a dilation was performed for dysphagia without stricture noted. She complains of occasional difficulty swallowing water.  Current Medications, Allergies, Past Medical History, Past Surgical History, Family History and Social History were reviewed in [employer] record.  Physical Exam: General: Well developed, well nourished, frail, elderly no acute distress Head: Normocephalic and atraumatic Eyes:  sclerae anicteric, EOMI Ears: Normal auditory acuity Mouth: No deformity or lesions Lungs: Clear throughout to auscultation Heart: Regular rate and rhythm; no murmurs, rubs or bruits Abdomen: Soft, non tender and non distended. No masses, hepatosplenomegaly or hernias noted. Normal Bowel sounds Musculoskeletal: Symmetrical with no gross deformities  Pulses:  Normal pulses noted Extremities: No clubbing, cyanosis, edema or deformities noted Neurological: Alert oriented x 4, grossly nonfocal Psychological:  Alert and cooperative. Depressed affect  Assessment and Recommendations:  1. Worsening chronic anemia. Unclear if this is iron deficiency. Continue iron 3 times a day  with meals. Follow up with her hematologist. Obtain iFOB. If she is negative no plans for further GI evaluation. If she is positive we will need to consider the risks and benefits of colonoscopy and upper endoscopy given her age and comorbidities.  2. Liquid dysphagia. Suspected motility disturbance. Obtain barium esophagram.  3. GERD. Continue Prevacid daily and standard antireflux measures.  4. Personal history of adenomatous colon polyps. No plans for surveillance colonoscopies due to age and comorbidities.  5. Anorexia and slight bowel habits. Etiology unclear. Ensure or similar supplements between meals and increase fiber and water intake.  6. Chronic atrial fibrillation on Eliquis. Aortic stenosis. History of diastolic heart failure.

## 2013-02-15 NOTE — Patient Instructions (Addendum)
Your physician has requested that you go to the basement for the following lab work before leaving today: Ifob.  Take your Iron three times a day with meals.   Further follow up with Dr. Concha Norway.   You have been scheduled for a Barium Esophogram at [facility] (1st floor of the hospital) on 02/20/13 at 9:30am. Please arrive 15 minutes prior to your appointment for registration. Make certain not to have anything to eat or drink 6 hours prior to your test. If you need to reschedule for any reason, please contact radiology at [phone] to do so. __________________________________________________________________ A barium swallow is an examination that concentrates on views of the esophagus. This tends to be a double contrast exam (barium and two liquids which, when combined, create a gas to distend the wall of the oesophagus) or single contrast (non-ionic iodine based). The study is usually tailored to your symptoms so a good history is essential. Attention is paid during the study to the form, structure and configuration of the esophagus, looking for functional disorders (such as aspiration, dysphagia, achalasia, motility and reflux) EXAMINATION You may be asked to change into a gown, depending on the type of swallow being performed. A radiologist and radiographer will perform the procedure. The radiologist will advise you of the type of contrast selected for your procedure and direct you during the exam. You will be asked to stand, sit or lie in several different positions and to hold a small amount of fluid in your mouth before being asked to swallow while the imaging is performed .In some instances you may be asked to swallow barium coated marshmallows to assess the motility of a solid food bolus. The exam can be recorded as a digital or video fluoroscopy procedure. POST PROCEDURE It will take 1-2 days for the barium to pass through your system. To facilitate this, it is important,  unless otherwise directed, to increase your fluids for the next 24-48hrs and to resume your normal diet.  This test typically takes about 30 minutes to perform. __________________________________________________________________________________  Thank you for choosing me and [facility].  Pricilla Riffle. Dagoberto Ligas., MD., Marval Regal  cc: Dione Housekeeper, MD

## 2013-02-16 ENCOUNTER — Telehealth: Payer: Self-pay

## 2013-02-16 ENCOUNTER — Other Ambulatory Visit: Payer: Self-pay

## 2013-02-16 NOTE — Telephone Encounter (Signed)
S/w daughter, pt is feeling fatigue and weakness, her apetite is lost, her labs are decreased. Copy of labs received from [facility]. Will confer with Dr Juliann Mule about return OV.

## 2013-02-17 ENCOUNTER — Telehealth: Payer: Self-pay | Admitting: Internal Medicine

## 2013-02-17 NOTE — Telephone Encounter (Signed)
S/w the pt's daughter carolyn and she is aware of the appts on 02/23/2013.

## 2013-02-20 ENCOUNTER — Ambulatory Visit (HOSPITAL_COMMUNITY)
Admission: RE | Admit: 2013-02-20 | Discharge: 2013-02-20 | Disposition: A | Discharge status: Home / Self Care | Payer: Medicare Other | Source: Ambulatory Visit | Attending: Gastroenterology | Admitting: Gastroenterology

## 2013-02-20 DIAGNOSIS — K449 Diaphragmatic hernia without obstruction or gangrene: Secondary | ICD-10-CM | POA: Insufficient documentation

## 2013-02-20 DIAGNOSIS — R1319 Other dysphagia: Secondary | ICD-10-CM

## 2013-02-20 DIAGNOSIS — K224 Dyskinesia of esophagus: Secondary | ICD-10-CM | POA: Insufficient documentation

## 2013-02-22 ENCOUNTER — Other Ambulatory Visit (INDEPENDENT_AMBULATORY_CARE_PROVIDER_SITE_OTHER): Payer: Medicare Other

## 2013-02-22 ENCOUNTER — Ambulatory Visit: Payer: Medicare Other

## 2013-02-22 ENCOUNTER — Other Ambulatory Visit: Payer: Medicare Other

## 2013-02-22 DIAGNOSIS — R198 Other specified symptoms and signs involving the digestive system and abdomen: Secondary | ICD-10-CM

## 2013-02-22 DIAGNOSIS — D509 Iron deficiency anemia, unspecified: Secondary | ICD-10-CM

## 2013-02-23 ENCOUNTER — Telehealth: Payer: Self-pay | Admitting: Internal Medicine

## 2013-02-23 ENCOUNTER — Ambulatory Visit (HOSPITAL_BASED_OUTPATIENT_CLINIC_OR_DEPARTMENT_OTHER): Payer: Medicare Other | Admitting: Internal Medicine

## 2013-02-23 ENCOUNTER — Other Ambulatory Visit (HOSPITAL_BASED_OUTPATIENT_CLINIC_OR_DEPARTMENT_OTHER): Payer: Medicare Other

## 2013-02-23 VITALS — BP 110/63 | HR 70 | Temp 98.3°F | Resp 20 | Ht 65.0 in | Wt 128.2 lb

## 2013-02-23 DIAGNOSIS — D539 Nutritional anemia, unspecified: Secondary | ICD-10-CM

## 2013-02-23 DIAGNOSIS — K209 Esophagitis, unspecified without bleeding: Secondary | ICD-10-CM

## 2013-02-23 DIAGNOSIS — Z7901 Long term (current) use of anticoagulants: Secondary | ICD-10-CM

## 2013-02-23 DIAGNOSIS — Z862 Personal history of diseases of the blood and blood-forming organs and certain disorders involving the immune mechanism: Secondary | ICD-10-CM

## 2013-02-23 DIAGNOSIS — I4891 Unspecified atrial fibrillation: Secondary | ICD-10-CM

## 2013-02-23 DIAGNOSIS — K921 Melena: Secondary | ICD-10-CM

## 2013-02-23 DIAGNOSIS — K5730 Diverticulosis of large intestine without perforation or abscess without bleeding: Secondary | ICD-10-CM

## 2013-02-23 LAB — BASIC METABOLIC PANEL (CC13)
Anion Gap: 10 mEq/L (ref 3–11)
BUN: 23.4 mg/dL (ref 7.0–26.0)
CHLORIDE: 105 meq/L (ref 98–109)
CO2: 25 mEq/L (ref 22–29)
Calcium: 9 mg/dL (ref 8.4–10.4)
Creatinine: 1 mg/dL (ref 0.6–1.1)
Glucose: 150 mg/dl — ABNORMAL HIGH (ref 70–140)
POTASSIUM: 4.3 meq/L (ref 3.5–5.1)
Sodium: 139 mEq/L (ref 136–145)

## 2013-02-23 LAB — FERRITIN CHCC: FERRITIN: 75 ng/mL (ref 9–269)

## 2013-02-23 LAB — CBC & DIFF AND RETIC
BASO%: 0.2 % (ref 0.0–2.0)
Basophils Absolute: 0 10*3/uL (ref 0.0–0.1)
EOS%: 1.8 % (ref 0.0–7.0)
Eosinophils Absolute: 0.1 10*3/uL (ref 0.0–0.5)
HCT: 29.6 % — ABNORMAL LOW (ref 34.8–46.6)
HGB: 9.4 g/dL — ABNORMAL LOW (ref 11.6–15.9)
Immature Retic Fract: 5 % (ref 1.60–10.00)
LYMPH%: 14.1 % (ref 14.0–49.7)
MCH: 28.2 pg (ref 25.1–34.0)
MCHC: 31.8 g/dL (ref 31.5–36.0)
MCV: 88.9 fL (ref 79.5–101.0)
MONO#: 0.4 10*3/uL (ref 0.1–0.9)
MONO%: 7.2 % (ref 0.0–14.0)
NEUT#: 4.3 10*3/uL (ref 1.5–6.5)
NEUT%: 76.7 % (ref 38.4–76.8)
PLATELETS: 209 10*3/uL (ref 145–400)
RBC: 3.33 10*6/uL — AB (ref 3.70–5.45)
RDW: 17.1 % — ABNORMAL HIGH (ref 11.2–14.5)
RETIC %: 3.86 % — AB (ref 0.70–2.10)
Retic Ct Abs: 128.54 10*3/uL — ABNORMAL HIGH (ref 33.70–90.70)
WBC: 5.5 10*3/uL (ref 3.9–10.3)
lymph#: 0.8 10*3/uL — ABNORMAL LOW (ref 0.9–3.3)

## 2013-02-23 LAB — IRON AND TIBC CHCC
%SAT: 28 % (ref 21–57)
Iron: 69 ug/dL (ref 41–142)
TIBC: 250 ug/dL (ref 236–444)
UIBC: 181 ug/dL (ref 120–384)

## 2013-02-23 LAB — VITAMIN B12: VITAMIN B 12: 1095 pg/mL — AB (ref 211–911)

## 2013-02-23 NOTE — Telephone Encounter (Signed)
gv pt appt schedule for april.  °

## 2013-02-23 NOTE — Patient Instructions (Signed)
Iron Deficiency Anemia, Adult Anemia is a condition in which there are less red blood cells or hemoglobin in the blood than normal. Hemoglobin is this part of red blood cells that carries oxygen. Iron deficiency anemia is anemia caused by too little iron. It is the most common type of anemia. It may leave you tired and short of breath. CAUSES   Lack of iron in the diet.  Poor absorption of iron, as seen with intestinal disorders.  Intestinal bleeding.  Heavy periods. SIGNS AND SYMPTOMS  Mild anemia may not be noticeable. Symptoms may include:  Fatigue.  Headache.  Pale skin.  Weakness.  Tiredness.  Shortness of breath.  Dizziness.  Cold hands and feet.  Fast or irregular heartbeat. DIAGNOSIS  Diagnosis requires a thorough evaluation and physical exam by your health care provider. Blood tests are generally used to confirm iron deficiency anemia. Additional tests may be done to find the underlying cause of your anemia. These may include:  Testing for blood in the stool (fecal occult blood test).  A procedure to see inside the colon and rectum (colonoscopy).  A procedure to see inside the esophagus and stomach (endoscopy). TREATMENT  Iron deficiency anemia is treated by correcting the cause of the deficiency. Treatment may involve:  Adding iron-rich foods to your diet.  Taking iron supplements. Pregnant or breastfeeding women need to take extra iron, because their normal diet usually does not provide the required amount.  Taking vitamins. Vitamin C improves the absorption of iron. Your health care provider may recommend taking your iron tablets with a glass of orange juice or vitamin C supplement.  Medicines to make heavy menstrual flow lighter.  Surgery. HOME CARE INSTRUCTIONS   Take iron as directed by your health care provider.  If you cannot tolerate taking iron supplements by mouth, talk to your health care provider about taking them through a vein  (intravenously) or an injection into a muscle.  For the best iron absorption, iron supplements should be taken on an empty stomach. If you cannot tolerate them on an empty stomach, you may need to take them with food.  Do not drink milk or take antacids at the same time as your iron supplements. Milk and antacids may interfere with the absorption of iron.  Iron supplements can cause constipation. Make sure to include fiber in your diet to prevent constipation. A stool softener may also be recommended.  Take vitamins as directed by your health care provider.  Eat a diet rich in iron. Foods high in iron include liver, lean beef, whole-grain bread, eggs, dried fruit, and dark green, leafy vegetables. SEEK IMMEDIATE MEDICAL CARE IF:   You faint. If this happens, do not drive. Call your local emergency services (911 in [place] if no other help is available.  You have chest pain.  You feel nauseous or vomit.  You have severe or increased shortness of breath with activity.  You feel weak.  You have a rapid heartbeat.  You have unexplained sweating.  You become lightheaded when getting up from a chair or bed. MAKE SURE YOU:   Understand these instructions.  Will watch your condition.  Will get help right away if you are not doing well or get worse. Document Released: 01/10/2000 Document Revised: 11/02/2012 Document Reviewed: 09/19/2012 [facility] Patient Information 2014 [place]. Diverticulosis Diverticulosis is a common condition that develops when small pouches (diverticula) form in the wall of the colon. The risk of diverticulosis increases with age. It happens more often  in people who eat a low-fiber diet. Most individuals with diverticulosis have no symptoms. Those individuals with symptoms usually experience abdominal pain, constipation, or loose stools (diarrhea). HOME CARE INSTRUCTIONS   Increase the amount of fiber in your diet as directed by your caregiver or dietician.  This may reduce symptoms of diverticulosis.  Your caregiver may recommend taking a dietary fiber supplement.  Drink at least 6 to 8 glasses of water each day to prevent constipation.  Try not to strain when you have a bowel movement.  Your caregiver may recommend avoiding nuts and seeds to prevent complications, although this is still an uncertain benefit.  Only take over-the-counter or prescription medicines for pain, discomfort, or fever as directed by your caregiver. FOODS WITH HIGH FIBER CONTENT INCLUDE:  Fruits. Apple, peach, pear, tangerine, raisins, prunes.  Vegetables. Brussels sprouts, asparagus, broccoli, cabbage, carrot, cauliflower, romaine lettuce, spinach, summer squash, tomato, winter squash, zucchini.  Starchy Vegetables. Baked beans, kidney beans, lima beans, split peas, lentils, potatoes (with skin).  Grains. Whole wheat bread, brown rice, bran flake cereal, plain oatmeal, white rice, shredded wheat, bran muffins. SEEK IMMEDIATE MEDICAL CARE IF:   You develop increasing pain or severe bloating.  You have an oral temperature above 102 F (38.9 C), not controlled by medicine.  You develop vomiting or bowel movements that are bloody or black. Document Released: 10/10/2003 Document Revised: 04/06/2011 Document Reviewed: 06/12/2009 [facility] Patient Information 2014 [place].

## 2013-02-24 NOTE — Progress Notes (Signed)
Hematology and Oncology Follow Up Visit  Anna Elliott [id number] 27-Mar-1921 80 y.o. 02/24/2013 5:20 AM Sherrie Mustache, MD  Principle Diagnosis: Chronic anemia  Prior Therapy: Vitamin B12  Current therapy: Vitamin B12  Interim History: Patient is a pleasant 80 year female who comes for follow-up for her chronic anemia.  She was last seen by me on 09/30/2012. She has no complaints and reports doing very well.  She has not required any transfusions. However, she did have a period of increased confusion and decreased appetite.  Her daughter accompanies her today.  ON December 30, her hemoglobin was 9.7 (her hgb baseline is 10) but dropped to 8.2 on 0106.  Her FOBT was negative.  She denies symptoms of anemia such as dyspnea on exertion, palpitations.    Medications: I have reviewed the patient's current medications.  Current Outpatient Prescriptions  Medication Sig Dispense Refill  . amLODipine (NORVASC) 5 MG tablet Take 1 tablet (5 mg total) by mouth daily.  90 tablet  3  . apixaban (ELIQUIS) 2.5 MG TABS tablet TAKE 1 TABLET (2.5 MG TOTAL) BY MOUTH 2 (TWO) TIMES DAILY.  180 tablet  3  . Calcium Carbonate-Vitamin D (CALCIUM + D PO) Take 2 tablets by mouth 2 (two) times daily.      . cyanocobalamin 500 MCG tablet Take 500 mcg by mouth daily.      . furosemide (LASIX) 20 MG tablet Take 10 mg by mouth daily.      . lansoprazole (PREVACID SOLUTAB) 30 MG disintegrating tablet Take 30 mg by mouth daily.      . metoprolol (LOPRESSOR) 50 MG tablet Take 75 mg by mouth daily.      . Multiple Vitamins-Minerals (EYE VITAMINS PO) Take 1 tablet by mouth daily.      Marland Kitchen zolpidem (AMBIEN) 10 MG tablet 2.5 mg at bedtime as needed for sleep.        No current facility-administered medications for this visit.     Allergies:  Allergies  Allergen Reactions  . Asa [Aspirin]   . Codeine Other (See Comments)    Makes her sick  . Potassium Chloride     Loss of appetite  . Sulfonamide Derivatives  Itching and Rash    Past Medical History, Surgical history, Social history, and Family History were reviewed and updated.  Review of Systems: Constitutional:  Negative for fever, chills, night sweats, anorexia, weight loss, pain. Cardiovascular: no chest pain or dyspnea on exertion Respiratory: no cough, shortness of breath, or wheezing Neurological: no TIA or stroke symptoms Dermatological: negative for rash ENT: negative for - epistaxis Skin: Negative. Gastrointestinal: no abdominal pain, change in bowel habits, or black or bloody stools Genito-Urinary: no dysuria, trouble voiding, or hematuria Hematological and Lymphatic: negative for - bleeding problems Breast: negative for breast lumps Musculoskeletal: negative for - gait disturbance Remaining ROS negative. Physical Exam: Blood pressure 110/63, pulse 70, temperature 98.3 F (36.8 C), temperature source Oral, resp. rate 20, height $RemoveBe'5\' 5"'PoDznaXoh$  (1.651 m), weight 128 lb 3.2 oz (58.151 kg). ECOG: 0 General appearance: alert, cooperative, appears stated age and no distress Head: Normocephalic, without obvious abnormality, atraumatic Neck: no adenopathy, supple, symmetrical, trachea midline and thyroid not enlarged, symmetric, no tenderness/mass/nodules Lymph nodes: Cervical, supraclavicular, and axillary nodes normal. Heart:regular rate and rhythm, S1, S2 normal and systolic murmur: systolic ejection 2/6, harsh without radiation (patient reports a history of murmur) Lung:chest clear, no wheezing, rales, normal symmetric air entry, Heart exam - S1, S2 normal, no murmur, no gallop, rate  regular Abdomin: soft, non-tender, without masses or organomegaly EXT:No peripheral edema   Lab Results: Lab Results  Component Value Date   WBC 5.5 02/23/2013   HGB 9.4* 02/23/2013   HCT 29.6* 02/23/2013   MCV 88.9 02/23/2013   PLT 209 02/23/2013     Chemistry      Component Value Date/Time   NA 139 02/23/2013 1405   NA 139 01/24/2013 1438   K 4.3  02/23/2013 1405   K 3.7 01/24/2013 1438   CL 102 01/24/2013 1438   CO2 25 02/23/2013 1405   CO2 30 01/24/2013 1438   BUN 23.4 02/23/2013 1405   BUN 23 01/24/2013 1438   CREATININE 1.0 02/23/2013 1405   CREATININE 1.0 01/24/2013 1438      Component Value Date/Time   CALCIUM 9.0 02/23/2013 1405   CALCIUM 8.8 01/24/2013 1438   ALKPHOS 64 09/30/2012 1307   ALKPHOS 52 06/23/2012 0540   AST 11 09/30/2012 1307   AST 16 06/23/2012 0540   ALT 7 09/30/2012 1307   ALT 12 06/23/2012 0540   BILITOT 0.91 09/30/2012 1307   BILITOT 0.9 06/23/2012 0540       Radiological Studies: No results found.   Impression and Plan: 1. History of refractory anemia dating back to 2002 when a bone marrow biopsy was performed. This did show evidence of perhaps early mild dysplasia and/or sideroblastic changes.  She has not required transfusions and has been stable over the years with a baseline hemoglobin of 10. I will continue to monitor her on a monthly basis. No intervention currently required.  Labs reviewed and hemogloblin stable.   Spent more than half the time coordinating care.    Anna Buhrman, MD 1/30/20155:20 AM

## 2013-02-27 LAB — FECAL OCCULT BLOOD, IMMUNOCHEMICAL: FECAL OCCULT BLD: NEGATIVE

## 2013-02-28 ENCOUNTER — Telehealth: Payer: Self-pay

## 2013-02-28 NOTE — Telephone Encounter (Signed)
FAXED PT MEDICAL RECORDS TO DR. NYLAND

## 2013-04-11 ENCOUNTER — Encounter: Payer: Self-pay | Admitting: Cardiology

## 2013-04-11 ENCOUNTER — Encounter (INDEPENDENT_AMBULATORY_CARE_PROVIDER_SITE_OTHER): Payer: Self-pay

## 2013-04-11 ENCOUNTER — Ambulatory Visit (INDEPENDENT_AMBULATORY_CARE_PROVIDER_SITE_OTHER): Payer: Medicare Other | Admitting: Cardiology

## 2013-04-11 VITALS — BP 124/72 | HR 70 | Ht 65.0 in | Wt 127.0 lb

## 2013-04-11 DIAGNOSIS — I350 Nonrheumatic aortic (valve) stenosis: Secondary | ICD-10-CM

## 2013-04-11 DIAGNOSIS — I5033 Acute on chronic diastolic (congestive) heart failure: Secondary | ICD-10-CM

## 2013-04-11 DIAGNOSIS — I359 Nonrheumatic aortic valve disorder, unspecified: Secondary | ICD-10-CM

## 2013-04-11 DIAGNOSIS — I119 Hypertensive heart disease without heart failure: Secondary | ICD-10-CM

## 2013-04-11 DIAGNOSIS — I509 Heart failure, unspecified: Secondary | ICD-10-CM

## 2013-04-11 DIAGNOSIS — D539 Nutritional anemia, unspecified: Secondary | ICD-10-CM

## 2013-04-11 DIAGNOSIS — I480 Paroxysmal atrial fibrillation: Secondary | ICD-10-CM

## 2013-04-11 DIAGNOSIS — I4891 Unspecified atrial fibrillation: Secondary | ICD-10-CM

## 2013-04-11 NOTE — Patient Instructions (Signed)
Your physician recommends that you continue on your current medications as directed. Please refer to the Current Medication list given to you today.  Your physician wants you to follow-up in: 6 month ov/ekg You will receive a reminder letter in the mail two months in advance. If you don't receive a letter, please call our office to schedule the follow-up appointment.  

## 2013-04-11 NOTE — Assessment & Plan Note (Signed)
Her anemia is being followed closely by her PCP

## 2013-04-11 NOTE — Progress Notes (Signed)
Anna Elliott Date of Birth:  [birth year] [address] [place], [place]  [address] [phone]         Fax   [phone]  History of Present Illness: This pleasant 80 year old woman is seen for a scheduled followup office visit.  She previously had been seen at [facility] for atrial fibrillation with rapid ventricular response. The more recent admission was from 06/30/12 until 07/05/12. She was admitted at that time with atrial fibrillation with rapid ventricular response. She was also noted to have known moderate aortic stenosis. She had evidence of acute on chronic diastolic congestive heart failure. She has a history of hypertension. On 07/04/12 she underwent a TEE cardioversion. She converted to normal sinus rhythm with 200 J synchronized biphasic energy. She was discharged the next day maintaining sinus rhythm with first degree AV block. She has been on Eliquis since her first admission. She has a history of moderate valvular aortic stenosis. Echocardiogram 06/23/12 showed an ejection fraction of 65% and moderate aortic stenosis with peak systolic gradient 42 and mean systolic gradient 23.  Since last visit she has had no new cardiac symptoms.  In January 2015 the patient started to feel bad.  Her daughter felt that it was one of her medication.  She cut back on the dose of her beta blocker and also cut back on her Lasix dose.  She continued to feel bad until she reduced her apixaban to just once a day and within 2 or 3 days she was feeling like her old self.  She has remained on apixaban just once a day and feels well now.  During that time the patient also underwent a GI workup because of anemia and no blood was found on stool Hemoccult.  Current Outpatient Prescriptions  Medication Sig Dispense Refill  . amLODipine (NORVASC) 5 MG tablet Take 1 tablet (5 mg total) by mouth daily.  90 tablet  3  . apixaban (ELIQUIS) 2.5 MG TABS tablet Take 2.5 mg by mouth daily.       .  Calcium Carbonate-Vitamin D (CALCIUM + D PO) Take 2 tablets by mouth 2 (two) times daily.      . cyanocobalamin 500 MCG tablet Take 500 mcg by mouth daily.      . furosemide (LASIX) 20 MG tablet Take 10 mg by mouth daily.      . lansoprazole (PREVACID SOLUTAB) 30 MG disintegrating tablet Take 30 mg by mouth daily.      . metoprolol (LOPRESSOR) 50 MG tablet Take 25-50 mg by mouth 2 (two) times daily. Take one tablet in the morning and one-half tablet in the evening.      . Multiple Vitamins-Minerals (EYE VITAMINS PO) Take 1 tablet by mouth daily.      Marland Kitchen zolpidem (AMBIEN) 10 MG tablet 2.5 mg at bedtime as needed for sleep.        No current facility-administered medications for this visit.    Allergies  Allergen Reactions  . Asa [Aspirin]   . Codeine Other (See Comments)    Makes her sick  . Potassium Chloride     Loss of appetite  . Sulfonamide Derivatives Itching and Rash    Patient Active Problem List   Diagnosis Date Noted  . Anemia 07/21/2012  . Black stools 07/21/2012  . Atrial fibrillation with rapid ventricular response 07/05/2012  . Anticoagulated with Eliquis 07/05/2012  . Acute on chronic diastolic CHF (congestive heart failure), NYHA class 3 07/05/2012  .  Acute diastolic heart failure   . Atrial fibrillation 06/22/2012  . Moderate aortic stenosis   . CHANGE IN BOWELS 11/14/2008  . FLATULENCE ERUCTATION AND GAS PAIN 10/29/2008  . DIARRHEA 10/29/2008  . ANEMIA, CHRONIC 05/14/2007  . HYPERTENSION 05/14/2007  . GERD 05/14/2007  . OTHER DYSPHAGIA 05/14/2007  . HIATAL HERNIA 09/10/2005  . DIVERTICULOSIS, COLON 08/02/1998  . ESOPHAGITIS 10/01/1987    History  Smoking status  . Never Smoker   Smokeless tobacco  . Never Used    History  Alcohol Use No    Family History  Problem Relation Age of Onset  . Heart failure Father   . Heart disease Brother   . Diabetes Father   . Colon cancer Brother   . Lung cancer Sister   . Lymphoma Sister     Review of  Systems: Constitutional: no fever chills diaphoresis or fatigue or change in weight.  Head and neck: no hearing loss, no epistaxis, no photophobia or visual disturbance. Respiratory: No cough, shortness of breath or wheezing. Cardiovascular: No chest pain peripheral edema, palpitations. Gastrointestinal: No abdominal distention, no abdominal pain, no change in bowel habits hematochezia or melena. Genitourinary: No dysuria, no frequency, no urgency, no nocturia. Musculoskeletal:No arthralgias, no back pain, no gait disturbance or myalgias. Neurological: No dizziness, no headaches, no numbness, no seizures, no syncope, no weakness, no tremors. Hematologic: No lymphadenopathy, no easy bruising. Psychiatric: No confusion, no hallucinations, no sleep disturbance.    Physical Exam: Filed Vitals:   04/11/13 1532  BP: 124/72  Pulse: 70   the general appearance reveals a well-developed alert elderly woman in no distress.The head and neck exam reveals pupils equal and reactive.  Extraocular movements are full.  There is no scleral icterus.  The mouth and pharynx are normal.  The neck is supple.  The carotids reveal no bruits.  The jugular venous pressure is normal.  The  thyroid is not enlarged.  There is no lymphadenopathy.  The chest is clear to percussion and auscultation.  There are no rales or rhonchi.  Expansion of the chest is symmetrical.  The precordium is quiet.  The first heart sound is normal.  The second heart sound is physiologically split.  There is a grade 2/6 systolic ejection murmur at the base.  There is no abnormal lift or heave.  The abdomen is soft and nontender.  The bowel sounds are normal.  The liver and spleen are not enlarged.  There are no abdominal masses.  There are no abdominal bruits.  Extremities reveal good pedal pulses.  There is no phlebitis or edema.  There is no cyanosis or clubbing.  Strength is normal and symmetrical in all extremities.  There is no lateralizing  weakness.  There are no sensory deficits.  The skin is warm and dry.  There is no rash.  EKG today shows sinus rhythm with first degree AV block.  Since last tracing 12/13/12, PR interval is longer.  Assessment / Plan: The patient is doing well.  Continue same medication.  Recheck in 6 months for followup office visit and EKG

## 2013-04-11 NOTE — Assessment & Plan Note (Signed)
The patient is not having any recent symptoms of congestive heart failure.  Her weight is down 4 pounds since last visit.

## 2013-04-11 NOTE — Assessment & Plan Note (Signed)
The patient has had no recurrence of her atrial fibrillation.  She remains on a subtherapeutic dose of apixaban 2.5 mg once a day.  She understands that this is a subtherapeutic dose if she were to go back into atrial fibrillation.  Normally she is quite symptomatic with her atrial fibrillation and would be able to tell if she were out of rhythm.  Since she was intolerant of the normal dose of apixaban, we will allow her to continue with the once a day dose acknowledging the above limitations.

## 2013-05-24 ENCOUNTER — Other Ambulatory Visit (HOSPITAL_BASED_OUTPATIENT_CLINIC_OR_DEPARTMENT_OTHER): Payer: Medicare Other

## 2013-05-24 ENCOUNTER — Ambulatory Visit (HOSPITAL_BASED_OUTPATIENT_CLINIC_OR_DEPARTMENT_OTHER): Payer: Medicare Other | Admitting: Internal Medicine

## 2013-05-24 ENCOUNTER — Telehealth: Payer: Self-pay | Admitting: Internal Medicine

## 2013-05-24 VITALS — BP 170/55 | HR 65 | Temp 98.0°F | Resp 17 | Ht 65.0 in | Wt 125.6 lb

## 2013-05-24 DIAGNOSIS — K5730 Diverticulosis of large intestine without perforation or abscess without bleeding: Secondary | ICD-10-CM

## 2013-05-24 DIAGNOSIS — D539 Nutritional anemia, unspecified: Secondary | ICD-10-CM

## 2013-05-24 DIAGNOSIS — R197 Diarrhea, unspecified: Secondary | ICD-10-CM

## 2013-05-24 DIAGNOSIS — D649 Anemia, unspecified: Secondary | ICD-10-CM

## 2013-05-24 LAB — CBC WITH DIFFERENTIAL/PLATELET
BASO%: 0.2 % (ref 0.0–2.0)
BASOS ABS: 0 10*3/uL (ref 0.0–0.1)
EOS ABS: 0 10*3/uL (ref 0.0–0.5)
EOS%: 0.6 % (ref 0.0–7.0)
HCT: 32.3 % — ABNORMAL LOW (ref 34.8–46.6)
HEMOGLOBIN: 10.6 g/dL — AB (ref 11.6–15.9)
LYMPH#: 0.9 10*3/uL (ref 0.9–3.3)
LYMPH%: 14.5 % (ref 14.0–49.7)
MCH: 28.9 pg (ref 25.1–34.0)
MCHC: 32.8 g/dL (ref 31.5–36.0)
MCV: 88 fL (ref 79.5–101.0)
MONO#: 0.4 10*3/uL (ref 0.1–0.9)
MONO%: 6.7 % (ref 0.0–14.0)
NEUT%: 78 % — AB (ref 38.4–76.8)
NEUTROS ABS: 5 10*3/uL (ref 1.5–6.5)
PLATELETS: 204 10*3/uL (ref 145–400)
RBC: 3.67 10*6/uL — ABNORMAL LOW (ref 3.70–5.45)
RDW: 15.3 % — ABNORMAL HIGH (ref 11.2–14.5)
WBC: 6.4 10*3/uL (ref 3.9–10.3)

## 2013-05-24 LAB — BASIC METABOLIC PANEL (CC13)
Anion Gap: 10 mEq/L (ref 3–11)
BUN: 20.5 mg/dL (ref 7.0–26.0)
CALCIUM: 9.4 mg/dL (ref 8.4–10.4)
CO2: 28 mEq/L (ref 22–29)
CREATININE: 1 mg/dL (ref 0.6–1.1)
Chloride: 104 mEq/L (ref 98–109)
GLUCOSE: 138 mg/dL (ref 70–140)
Potassium: 4.1 mEq/L (ref 3.5–5.1)
Sodium: 142 mEq/L (ref 136–145)

## 2013-05-24 LAB — IRON AND TIBC CHCC
%SAT: 22 % (ref 21–57)
Iron: 55 ug/dL (ref 41–142)
TIBC: 244 ug/dL (ref 236–444)
UIBC: 189 ug/dL (ref 120–384)

## 2013-05-24 LAB — FERRITIN CHCC: FERRITIN: 69 ng/mL (ref 9–269)

## 2013-05-24 NOTE — Progress Notes (Signed)
Hematology and Oncology Follow Up Visit  Anna Elliott [id number] 05-01-1921 80 y.o. 05/24/2013 1:56 PM Sherrie Mustache, MD  Principle Diagnosis: Chronic anemia  Prior Therapy: Vitamin B12  Current therapy: Vitamin B12  Interim History: Patient is a pleasant 80 year female who comes for follow-up for her chronic anemia.  She was last seen by me on 02/23/2013. She is having abdominal complaints with more frequent stools over past four months.  She will discuss with Dr. Sherryl Barters office later this month.  She denies abdominal pain or fevers.  On December 30, her hemoglobin was 9.7 (her hgb baseline is 10) but dropped to 8.2 on 01/06.  Her FOBT was negative.  She denies symptoms of anemia such as dyspnea on exertion, palpitations.    Medications: I have reviewed the patient's current medications.  Current Outpatient Prescriptions  Medication Sig Dispense Refill  . amLODipine (NORVASC) 5 MG tablet Take 1 tablet (5 mg total) by mouth daily.  90 tablet  3  . apixaban (ELIQUIS) 2.5 MG TABS tablet Take 2.5 mg by mouth daily.       . Calcium Carbonate-Vitamin D (CALCIUM + D PO) Take 2 tablets by mouth 2 (two) times daily.      . cyanocobalamin 500 MCG tablet Take 500 mcg by mouth daily.      . furosemide (LASIX) 20 MG tablet Take 10 mg by mouth daily.      . lansoprazole (PREVACID SOLUTAB) 30 MG disintegrating tablet Take 30 mg by mouth daily.      . metoprolol (LOPRESSOR) 50 MG tablet Take 25-50 mg by mouth 2 (two) times daily. Take one tablet in the morning and one-half tablet in the evening.      . Multiple Vitamins-Minerals (EYE VITAMINS PO) Take 1 tablet by mouth daily.      Marland Kitchen terconazole (TERAZOL 7) 0.4 % vaginal cream Place vaginally as needed.      . zolpidem (AMBIEN) 10 MG tablet 2.5 mg at bedtime as needed for sleep.        No current facility-administered medications for this visit.     Allergies:  Allergies  Allergen Reactions  . Asa [Aspirin]   . Codeine Other (See  Comments)    Makes her sick  . Potassium Chloride     Loss of appetite  . Sulfonamide Derivatives Itching and Rash    Past Medical History, Surgical history, Social history, and Family History were reviewed and updated.  Review of Systems: Constitutional:  Negative for fever, chills, night sweats, anorexia, weight loss, pain. Cardiovascular: no chest pain or dyspnea on exertion Respiratory: no cough, shortness of breath, or wheezing Neurological: no TIA or stroke symptoms Dermatological: negative for rash ENT: negative for - epistaxis Skin: Negative. Gastrointestinal: no abdominal pain, change in bowel habits, or black or bloody stools Genito-Urinary: no dysuria, trouble voiding, or hematuria Hematological and Lymphatic: negative for - bleeding problems Breast: negative for breast lumps Musculoskeletal: negative for - gait disturbance Remaining ROS negative. Physical Exam: Blood pressure 170/55, pulse 65, temperature 98 F (36.7 C), temperature source Oral, resp. rate 17, height _0  (1.651 m), weight 125 lb 9.6 oz (56.972 kg), SpO2 99.00%. ECOG: 0 General appearance: alert, cooperative, appears stated age and no distress Head: Normocephalic, without obvious abnormality, atraumatic Neck: no adenopathy, supple, symmetrical, trachea midline and thyroid not enlarged, symmetric, no tenderness/mass/nodules Lymph nodes: Cervical, supraclavicular, and axillary nodes normal. Heart:regular rate and rhythm, S1, S2 normal and systolic murmur: systolic ejection 2/6, harsh without radiation (patient  reports a history of murmur) Lung:chest clear, no wheezing, rales, normal symmetric air entry, Heart exam - S1, S2 normal, no murmur, no gallop, rate regular Abdomin: soft, non-tender, without masses or organomegaly EXT:No peripheral edema   Lab Results: Lab Results  Component Value Date   WBC 6.4 05/24/2013   HGB 10.6* 05/24/2013   HCT 32.3* 05/24/2013   MCV 88.0 05/24/2013   PLT 204  05/24/2013     Chemistry      Component Value Date/Time   NA 142 05/24/2013 1303   NA 139 01/24/2013 1438   K 4.1 05/24/2013 1303   K 3.7 01/24/2013 1438   CL 102 01/24/2013 1438   CO2 28 05/24/2013 1303   CO2 30 01/24/2013 1438   BUN 20.5 05/24/2013 1303   BUN 23 01/24/2013 1438   CREATININE 1.0 05/24/2013 1303   CREATININE 1.0 01/24/2013 1438      Component Value Date/Time   CALCIUM 9.4 05/24/2013 1303   CALCIUM 8.8 01/24/2013 1438   ALKPHOS 64 09/30/2012 1307   ALKPHOS 52 06/23/2012 0540   AST 11 09/30/2012 1307   AST 16 06/23/2012 0540   ALT 7 09/30/2012 1307   ALT 12 06/23/2012 0540   BILITOT 0.91 09/30/2012 1307   BILITOT 0.9 06/23/2012 0540       Radiological Studies: No results found.   Impression and Plan:  1. Refractory anemia. -- History of refractory anemia dating back to 2002 when a bone marrow biopsy was performed. This did show evidence of perhaps early mild dysplasia and/or sideroblastic changes.  She has not required transfusions and has been stable over the years with a baseline hemoglobin of 10. I will continue to monitor her every three months. No intervention currently required.  Labs reviewed and hemogloblin stable.   2. Diarrhea. --She relates it to her use of eliquis.   We counseled to continue supportive care and discuss with cardiology. Creatinine 1.0.   3. Follow up. --patient should have labs and CMP in six months,.   All questions were answered. The patient knows to call the clinic with any problems, questions or concerns. We can certainly see the patient much sooner if necessary.   I spent 15 minutes counseling the patient face to face. The total time spent in the appointment was 25 minutes.   Concha Norway, MD 4/29/20151:56 PM

## 2013-05-24 NOTE — Telephone Encounter (Signed)
Gave pt appt for lab and MD for October 2015 °

## 2013-06-12 ENCOUNTER — Telehealth: Payer: Self-pay | Admitting: Cardiology

## 2013-06-12 NOTE — Telephone Encounter (Signed)
Pt's family called- pt has been in a fib since Sat.  She has refused to go to ER.  Her HR is 140 with  BP 154/85,  I have asked them to come to the ER with that HR in a 80 year old.  She has no chest pain but does have dizziness with walking.  I asked her to take 50 mg lopressor now but that she should come to ER.  She may develop acute Shortness of breath.  Otherwise the pt will call the office in AM.

## 2013-06-13 ENCOUNTER — Observation Stay (HOSPITAL_COMMUNITY)
Admission: EM | Admit: 2013-06-13 | Discharge: 2013-06-15 | Disposition: A | Discharge status: Home / Self Care | Payer: Medicare Other | Attending: Cardiology | Admitting: Cardiology

## 2013-06-13 ENCOUNTER — Telehealth: Payer: Self-pay | Admitting: Cardiology

## 2013-06-13 ENCOUNTER — Encounter (HOSPITAL_COMMUNITY): Payer: Self-pay | Admitting: Emergency Medicine

## 2013-06-13 ENCOUNTER — Emergency Department (HOSPITAL_COMMUNITY): Payer: Medicare Other

## 2013-06-13 DIAGNOSIS — I359 Nonrheumatic aortic valve disorder, unspecified: Secondary | ICD-10-CM | POA: Insufficient documentation

## 2013-06-13 DIAGNOSIS — I509 Heart failure, unspecified: Secondary | ICD-10-CM | POA: Insufficient documentation

## 2013-06-13 DIAGNOSIS — I10 Essential (primary) hypertension: Secondary | ICD-10-CM | POA: Diagnosis present

## 2013-06-13 DIAGNOSIS — I5033 Acute on chronic diastolic (congestive) heart failure: Secondary | ICD-10-CM

## 2013-06-13 DIAGNOSIS — I350 Nonrheumatic aortic (valve) stenosis: Secondary | ICD-10-CM | POA: Diagnosis present

## 2013-06-13 DIAGNOSIS — I5032 Chronic diastolic (congestive) heart failure: Secondary | ICD-10-CM | POA: Insufficient documentation

## 2013-06-13 DIAGNOSIS — I4891 Unspecified atrial fibrillation: Principal | ICD-10-CM | POA: Diagnosis present

## 2013-06-13 DIAGNOSIS — D539 Nutritional anemia, unspecified: Secondary | ICD-10-CM | POA: Diagnosis present

## 2013-06-13 DIAGNOSIS — Z7901 Long term (current) use of anticoagulants: Secondary | ICD-10-CM

## 2013-06-13 DIAGNOSIS — K219 Gastro-esophageal reflux disease without esophagitis: Secondary | ICD-10-CM | POA: Insufficient documentation

## 2013-06-13 DIAGNOSIS — D649 Anemia, unspecified: Secondary | ICD-10-CM | POA: Insufficient documentation

## 2013-06-13 DIAGNOSIS — R002 Palpitations: Secondary | ICD-10-CM

## 2013-06-13 HISTORY — DX: Chronic diastolic (congestive) heart failure: I50.32

## 2013-06-13 LAB — CBC
HEMATOCRIT: 32.2 % — AB (ref 36.0–46.0)
Hemoglobin: 10.5 g/dL — ABNORMAL LOW (ref 12.0–15.0)
MCH: 29.1 pg (ref 26.0–34.0)
MCHC: 32.6 g/dL (ref 30.0–36.0)
MCV: 89.2 fL (ref 78.0–100.0)
PLATELETS: 198 10*3/uL (ref 150–400)
RBC: 3.61 MIL/uL — ABNORMAL LOW (ref 3.87–5.11)
RDW: 15.4 % (ref 11.5–15.5)
WBC: 6.1 10*3/uL (ref 4.0–10.5)

## 2013-06-13 LAB — BASIC METABOLIC PANEL
BUN: 21 mg/dL (ref 6–23)
CHLORIDE: 105 meq/L (ref 96–112)
CO2: 26 mEq/L (ref 19–32)
CREATININE: 0.89 mg/dL (ref 0.50–1.10)
Calcium: 9 mg/dL (ref 8.4–10.5)
GFR, EST AFRICAN AMERICAN: 64 mL/min — AB (ref >90)
GFR, EST NON AFRICAN AMERICAN: 55 mL/min — AB (ref >90)
Glucose, Bld: 133 mg/dL — ABNORMAL HIGH (ref 70–99)
Potassium: 4.1 mEq/L (ref 3.7–5.3)
Sodium: 143 mEq/L (ref 137–147)

## 2013-06-13 LAB — URINE MICROSCOPIC-ADD ON

## 2013-06-13 LAB — URINALYSIS, ROUTINE W REFLEX MICROSCOPIC
Glucose, UA: NEGATIVE mg/dL
Ketones, ur: NEGATIVE mg/dL
Nitrite: NEGATIVE
PROTEIN: NEGATIVE mg/dL
Specific Gravity, Urine: 1.023 (ref 1.005–1.030)
Urobilinogen, UA: 1 mg/dL (ref 0.0–1.0)
pH: 6 (ref 5.0–8.0)

## 2013-06-13 LAB — TROPONIN I: Troponin I: <0.3 ng/mL (ref <0.30)

## 2013-06-13 LAB — PRO B NATRIURETIC PEPTIDE: PRO B NATRI PEPTIDE: 4974 pg/mL — AB (ref 0–450)

## 2013-06-13 LAB — I-STAT TROPONIN, ED: Troponin i, poc: 0.01 ng/mL (ref 0.00–0.08)

## 2013-06-13 LAB — TSH: TSH: 2.04 u[IU]/mL (ref 0.350–4.500)

## 2013-06-13 LAB — MAGNESIUM: Magnesium: 2.2 mg/dL (ref 1.5–2.5)

## 2013-06-13 MED ORDER — DILTIAZEM HCL 30 MG PO TABS
30.0000 mg | ORAL_TABLET | Freq: Four times a day (QID) | ORAL | Status: DC
  Administered 2013-06-13 – 2013-06-15 (×9): 30 mg via ORAL
  Filled 2013-06-13 (×11): qty 1

## 2013-06-13 MED ORDER — ZOLPIDEM TARTRATE 5 MG PO TABS
2.5000 mg | ORAL_TABLET | Freq: Every day | ORAL | Status: DC
  Administered 2013-06-13 – 2013-06-14 (×2): 2.5 mg via ORAL
  Filled 2013-06-13 (×3): qty 1

## 2013-06-13 MED ORDER — FUROSEMIDE 20 MG PO TABS
10.0000 mg | ORAL_TABLET | Freq: Every day | ORAL | Status: DC
  Administered 2013-06-13 – 2013-06-15 (×3): 10 mg via ORAL
  Filled 2013-06-13 (×3): qty 0.5

## 2013-06-13 MED ORDER — CYANOCOBALAMIN 500 MCG PO TABS
500.0000 ug | ORAL_TABLET | Freq: Every day | ORAL | Status: DC
  Administered 2013-06-14 – 2013-06-15 (×2): 500 ug via ORAL
  Filled 2013-06-13 (×3): qty 1

## 2013-06-13 MED ORDER — APIXABAN 2.5 MG PO TABS
2.5000 mg | ORAL_TABLET | Freq: Two times a day (BID) | ORAL | Status: DC
  Administered 2013-06-13 – 2013-06-15 (×4): 2.5 mg via ORAL
  Filled 2013-06-13 (×5): qty 1

## 2013-06-13 NOTE — ED Notes (Signed)
Cardiology at bedside with patient

## 2013-06-13 NOTE — Telephone Encounter (Signed)
New message    Wanted the nurse to know she taken her mother to emergency room will take 45 min to get there.

## 2013-06-13 NOTE — Telephone Encounter (Signed)
Marcine Matar at 06/13/2013 9:18 AM     Status: Signed        New message  Wanted the nurse to know she taken her mother to emergency room will take 45 min to get there.

## 2013-06-13 NOTE — Telephone Encounter (Signed)
Diffficult situation. She is on a subtherapeutic dose of apixaban by her own choosing (see my note of March 2015). Fortunately she is tolerating her fast heart rate okay for the time being. Her BP is adequate. I would recommend that she increase her apixaban to full BID dose to protect against stroke. She could hold her amlodipine to allow for higher dose of metoprolol to slow her rate. Consider DCCV in 3-4 weeks if still in AF.  If she becomes more symptomatic and cannot wait that long, she might need TEE-cardioversion. Could also consider adding small dose of diltiazem for rate control or adding small dose of amiodarone orally to promote conversion. The most efficient thing would be for her to be willing to go to hospital now for a short admission for stabilization and possible TEE-cardioversion in the next day or so.

## 2013-06-13 NOTE — H&P (Signed)
Patient ID: Anna Elliott MRN: [id number], DOB/AGE: February 01, 1921   Admit date: 06/13/2013   Primary Physician: Sherrie Mustache, MD Primary Cardiologist: Dr. Mare Ferrari  Pt. Profile: Anna Elliott is a 80 y.o. female with a history of HTN, chronic anemia, hx of rheumatic fever, PAF on eliquis, tubular adenoma of the colon and mod AS who presented to [facility] today with atrial fibrillation with RVR since Saturday morning.   She was admitted for atrial fibrillation with RVR in 06/2012 and underwent successful TEE/DCCV. She converted to normal sinus rhythm with 200 J synchronized biphasic energy. She was discharged the next day maintaining sinus rhythm with first degree AV block. She has been on Eliquis since her first admission. She has a history of moderate valvular aortic stenosis. Echocardiogram at that time on 06/23/12 showed an ejection fraction of 65% and moderate aortic stenosis with peak systolic gradient 42 and mean systolic gradient 23. During her last follow up with Dr. Mare Ferrari she complained of GI issues and loss of appetite due to Tower and her dose was cut to 2.5 mg 1x daily with resolution of her symptoms. Her daughter had further decreased her dose to 1.25 mg daily without consulting a MD. She has verbalized understanding of the risks of subtheraputic anticoagulation.   The patient reported taking a benadryl Friday night for a mosquito bite and woke up Saturday with palpitations. She was feeling okay and able to complete her ADLS so she refused to come to the ED. Her HR was reportedly 140 with BP 154/85 on Saturday morning. However, this morning she had palpitations, weakness, lightheadedess and unsteady gait and decided to come into the ED. Her HR upon awakening was 117 bmp and she took an extra metoprolol this AM (50mg  instead of 25mg ) as recommended by Cecilie Kicks PA-C by telephone. She called Dr. Mare Ferrari today who recommended that she increase her apixaban to full BID dose to  protect against stroke and go to the hospital now for a short admission for stabilization and possible TEE-cardioversion.  No recent falls, orthopnea, PND, LE swelling. No CP.   Problem List  Past Medical History  Diagnosis Date  . Hypertension   . Moderate aortic stenosis     a. Dx 2011. b. Echo 05/2012: EF 65%, mild LVH, mod AS mg 50mmHg, mildly dilated LA.  Marland Kitchen Chronic anemia     a. dating back to 2002 - BM bx: early mild dysplasia and/or sideroblastic changes.  Marland Kitchen Dysphagia   . GERD (gastroesophageal reflux disease)   . Esophagitis   . Hiatal hernia   . H/O: rheumatic fever     a. as a child  . Atrial fibrillation     a. Dx 05/2012 unknown duration, started on Eliqiuis.  . Acute diastolic heart failure   . Tubular adenoma of colon 11/2008    Past Surgical History  Procedure Laterality Date  . Tonsillectomy    . Cesarean section  1962  . Abdominal hysterectomy  1967  . Cataract extraction w/ intraocular lens  implant, bilateral Bilateral 1990's  . Esophageal dilation      "twice in her lifetime, I think; most recent was early 2000's" (06/22/2012)  . Tee without cardioversion N/A 07/04/2012    Procedure: TRANSESOPHAGEAL ECHOCARDIOGRAM (TEE);  Surgeon: Fay Records, MD;  Location: [place];  Service: Cardiovascular;  Laterality: N/A;  talk to [facility]  . Cardioversion N/A 07/04/2012    Procedure: CARDIOVERSION;  Surgeon: Fay Records, MD;  Location: [place];  Service:  Cardiovascular;  Laterality: N/A;     Allergies  Allergies  Allergen Reactions  . Asa [Aspirin]   . Codeine Other (See Comments)    Makes her sick  . Potassium Chloride     Loss of appetite  . Sulfonamide Derivatives Itching and Rash    Home Medications  Prior to Admission medications   Medication Sig Start Date End Date Taking? Authorizing Provider  amLODipine (NORVASC) 5 MG tablet Take 1 tablet (5 mg total) by mouth daily. 12/13/12  Yes Darlin Coco, MD  apixaban (ELIQUIS) 2.5 MG TABS tablet Take  2.5 mg by mouth daily.  12/13/12  Yes Darlin Coco, MD  Calcium Carbonate-Vitamin D (CALCIUM + D PO) Take 2 tablets by mouth 2 (two) times daily.   Yes Historical Provider, MD  cyanocobalamin 500 MCG tablet Take 500 mcg by mouth daily.   Yes Historical Provider, MD  diphenhydrAMINE (BENADRYL) 25 MG tablet Take 25 mg by mouth once as needed for itching (from mosquito bite).   Yes Historical Provider, MD  furosemide (LASIX) 20 MG tablet Take 10 mg by mouth daily. 12/13/12  Yes Darlin Coco, MD  lansoprazole (PREVACID SOLUTAB) 30 MG disintegrating tablet Take 30 mg by mouth daily.   Yes Historical Provider, MD  metoprolol (LOPRESSOR) 50 MG tablet Take 25-50 mg by mouth 2 (two) times daily. Take one tablet in the morning and one-half tablet in the evening. 12/13/12  Yes Darlin Coco, MD  Multiple Vitamins-Minerals (EYE VITAMINS PO) Take 1 tablet by mouth daily.   Yes Historical Provider, MD  zolpidem (AMBIEN) 10 MG tablet Take 2.5 mg by mouth at bedtime.  09/14/11  Yes Historical Provider, MD    Family History  Family History  Problem Relation Age of Onset  . Heart failure Father   . Heart disease Brother   . Diabetes Father   . Colon cancer Brother   . Lung cancer Sister   . Lymphoma Sister     Social History  History   Social History  . Marital Status: Widowed    Spouse Name: N/A    Number of Children: N/A  . Years of Education: N/A   Occupational History  . Not on file.   Social History Main Topics  . Smoking status: Never Smoker   . Smokeless tobacco: Never Used  . Alcohol Use: No  . Drug Use: No  . Sexual Activity: Not on file   Other Topics Concern  . Not on file   Social History Narrative   Lives in [place] with her dtr and son-in-law.  She is active around the house and occasionally goes for walks at the mall.  No h/o falls though she notes that her macular degeneration limits her vision somewhat and she is prone to losing her balance on inclines.      All other systems reviewed and are otherwise negative except as noted above.  Physical Exam  Blood pressure 143/68, pulse 81, temperature 98 F (36.7 C), temperature source Oral, resp. rate 14, height 5\' 5"  (1.651 m), weight 125 lb (56.7 kg), SpO2 95.00%.  General: Pleasant, NAD. Frail Psych: Normal affect. Neuro: Alert and oriented X 3. Moves all extremities spontaneously. HEENT: Normal  Neck: Supple without bruits or JVD. Lungs:  Resp regular and unlabored, CTA. Heart: RRR no s3, s4, or murmurs. There is a grade 2/6 systolic ejection murmur at the base. Abdomen: Soft, non-tender, non-distended, BS + x 4.  Extremities: No clubbing, cyanosis or edema. DP/PT/Radials 2+ and equal bilaterally.  Labs  No results found for this basename: CKTOTAL, CKMB, TROPONINI,  in the last 72 hours Lab Results  Component Value Date   WBC 6.1 06/13/2013   HGB 10.5* 06/13/2013   HCT 32.2* 06/13/2013   MCV 89.2 06/13/2013   PLT 198 06/13/2013    Recent Labs Lab 06/13/13 1125  NA 143  K 4.1  CL 105  CO2 26  BUN 21  CREATININE 0.89  CALCIUM 9.0  GLUCOSE 133*   Lab Results  Component Value Date   CHOL 132 06/23/2012   HDL 41 06/23/2012   LDLCALC 71 06/23/2012   TRIG 100 06/23/2012      Radiology/Studies  Dg Chest 2 View  06/13/2013   CLINICAL DATA:  80 year old female with tachycardia. Initial encounter.  EXAM: CHEST  2 VIEW  COMPARISON:  Portable chest radiograph 07/03/2012.  FINDINGS: Interval resolved small left pleural effusion. Stable lung volumes. Stable cardiomegaly and mediastinal contours. No pneumothorax or pulmonary edema. No consolidation or confluent pulmonary opacity. Multilevel mild compression fractures in the thoracic spine. Osteopenia. Mild scoliosis. Calcified atherosclerosis, including involvement of the aorta.  IMPRESSION: Stable cardiomegaly. No acute cardiopulmonary abnormality.    ECG HR 79 atrial fibrillation   ASSESSMENT AND PLAN  Donyel Gillentine is a 80 y.o.  female with a history of HTN, chronic anemia, hx of rheumatic fever, PAF on eliquis, tubular adenoma of the colon and mod AS who presented to [facility] today with atrial fibrillation with RVR since Saturday morning.   Atrial fibrillation with RVR- now she is rate controlled after 50mg  of metoprolol this AM. (25mg  is normal dose) -- On 1.25 mg of apixiban due to GI problems with the medication. She understood and accepted the risk of sub-theraputic anticoagulation.  -- She has has now decided that she will take therapeutic doses (2.5mg  BID) of Eliquis. Depending on how she does with rate control over the next day or so she will undergo TEE/DCCV after 5 doses of Eliqius or be discharged on the medication for 3 weeks with DCCV at that time -- Discontinue metoprolol 25mg  BID. Diltiazem 30mg  q6 -- TSH pending  Chronic anemia- Hg 10.5-- stable. Followed closely by PCP  HTN- stable. D/c amlodipine 5mg  and metoprolol 25mg . Will start Dilt 30mg  q 6h  Chronic diastolic CHF- BNP elevated at 4974 but CXR clear. She appears euvolemic on exam. Continue home dose of Lasix 20mg .  Aortic stenosis- Echocardiogram at that time on 06/23/12 showed an ejection fraction of 65% and moderate aortic stenosis with peak systolic gradient 42 and mean systolic gradient 23  Signed, Perry Mount, PA-C 06/13/2013, 1:21 PM  Pager [phone] As above, patient seen and examined. Briefly she is a 80 year old female with past medical history of moderate aortic stenosis, paroxysmal atrial fibrillation, hypertension, anemia with recurrent atrial fibrillation. Last echocardiogram in May of 2014 showed normal LV function and moderate aortic stenosis with a mean gradient of 23 mm mercury. There was mild left atrial enlargement. Patient had TEE guided cardioversion in June of 2014. Patient took a Benadryl following a bee sting on May 15. On May 16 she developed palpitations associated with dizziness and weakness. No dyspnea or chest pain. She has  had no syncope or bleeding. Her heart rate has been elevated to as high as 140. Her daughter gave her additional metoprolol this morning with improvement in her heart rate her blood pressure decreased and she complained of weakness and presented to the emergency room. She is presently asymptomatic. Electrocardiogram shows atrial flutter versus  coarse atrial fibrillation. Rate is presently in the 80s. Long discussion with patient and daughter today. She had decreased appetite with weight loss back in January. Her daughter felt it was related to apixaban and decreased the dose to 1.25 mg daily. Her symptoms improved. She also thought it may be related to metoprolol.  I had long discussions today concerning options for treating atrial fibrillation. We discussed rate control versus rhythm control. She has palpitations with weakness and would prefer sinus rhythm if possible. I explained that she must be therapeutically anticoagulated prior to cardioversion. I discussed the possibility of changing apixaban to Coumadin or xarelto (because of perceived side effects) but they declined. They have agreed to increase dose to 2.5 mg by mouth twice a day which would be therapeutic for her. I will discontinue metoprolol as this may be contributing to her weakness. We will instead begin Cardizem 30 mg every 6 hours and increase dose as needed. Ultimately change to CD. If her rate is controlled and her symptoms are reasonably well controlled she could be discharged with outpatient cardioversion in 3 weeks. If symptoms persist we will plan TEE guided cardioversion after 5 doses of therapeutic apixaban (5/22). They are in agreement with this plan. Check TSH. Repeat echocardiogram for aortic stenosis. Note she may require an antiarrhythmic in the future but it has been one year since her previous episode of atrial fibrillation and I will therefore not began at this point. I would most likely treat with amiodarone if an antiarrhythmic  is required. Discontinue Norvasc and follow blood pressure on Cardizem alone. Denice Bors Crenshaw 2:29 PM

## 2013-06-13 NOTE — ED Notes (Signed)
Pt and family member reports pt having high HR since Saturday, pt took a benadryl Friday night for a mosquito bite and woke up Saturday with HR 140. Pt feeling lightheaded and unsteady gait. EKG done at triage, HR 70.

## 2013-06-13 NOTE — ED Provider Notes (Signed)
CSN: [id number]     Arrival date & time 06/13/13  1019 History   First MD Initiated Contact with Patient 06/13/13 1115     Chief Complaint  Patient presents with  . Tachycardia      HPI Pt was seen at 1120. Per pt and her family, c/o gradual onset and persistence of multiple intermittent episodes of palpitations and lightheadedness for the past 4 days. Pt took a benadryl 5 days ago for "an itchy mosquito bite" and woke up the next morning with her "HR in the 140's." Pt states she has been taking her usual medications as previously prescribed. Pt's family states they took pt's HR this morning at 0730 while she was still lying in bed "and it was in the 130's." Pt's family gave her "a whole tablet of her metoprolol" at that time. Approximately one hour later, pt c/o feeling "lightheaded" and "was unable to walk because she was too weak." Pt laid down again and family states "her HR was 110's." Pt has not taken any of her other medications this morning. Denies CP/SOB, no cough, no abd pain, no N/V/D, no back pain, no focal motor weakness, no tingling/numbness in extremities.     Cards: [place] Past Medical History  Diagnosis Date  . Hypertension   . Moderate aortic stenosis     a. Dx 2011. b. Echo 05/2012: EF 65%, mild LVH, mod AS mg 63mmHg, mildly dilated LA.  Marland Kitchen Chronic anemia     a. dating back to 2002 - BM bx: early mild dysplasia and/or sideroblastic changes.  Marland Kitchen Dysphagia   . GERD (gastroesophageal reflux disease)   . Esophagitis   . Hiatal hernia   . H/O: rheumatic fever     a. as a child  . Atrial fibrillation     a. Dx 05/2012 unknown duration, started on Eliqiuis.  . Acute diastolic heart failure   . Tubular adenoma of colon 11/2008   Past Surgical History  Procedure Laterality Date  . Tonsillectomy    . Cesarean section  1962  . Abdominal hysterectomy  1967  . Cataract extraction w/ intraocular lens  implant, bilateral Bilateral 1990's  . Esophageal dilation      "twice  in her lifetime, I think; most recent was early 2000's" (06/22/2012)  . Tee without cardioversion N/A 07/04/2012    Procedure: TRANSESOPHAGEAL ECHOCARDIOGRAM (TEE);  Surgeon: Fay Records, MD;  Location: [place];  Service: Cardiovascular;  Laterality: N/A;  talk to [facility]  . Cardioversion N/A 07/04/2012    Procedure: CARDIOVERSION;  Surgeon: Fay Records, MD;  Location: [facility] ENDOSCOPY;  Service: Cardiovascular;  Laterality: N/A;   Family History  Problem Relation Age of Onset  . Heart failure Father   . Heart disease Brother   . Diabetes Father   . Colon cancer Brother   . Lung cancer Sister   . Lymphoma Sister    History  Substance Use Topics  . Smoking status: Never Smoker   . Smokeless tobacco: Never Used  . Alcohol Use: No    Review of Systems ROS: Statement: All systems negative except as marked or noted in the HPI; Constitutional: Negative for fever and chills. ; ; Eyes: Negative for eye pain, redness and discharge. ; ; ENMT: Negative for ear pain, hoarseness, nasal congestion, sinus pressure and sore throat. ; ; Cardiovascular: +palpitations. Negative for chest pain, diaphoresis, dyspnea and peripheral edema. ; ; Respiratory: Negative for cough, wheezing and stridor. ; ; Gastrointestinal: Negative for nausea, vomiting, diarrhea,  abdominal pain, blood in stool, hematemesis, jaundice and rectal bleeding. . ; ; Genitourinary: Negative for dysuria, flank pain and hematuria. ; ; Musculoskeletal: Negative for back pain and neck pain. Negative for swelling and trauma.; ; Skin: Negative for pruritus, rash, abrasions, blisters, bruising and skin lesion.; ; Neuro: +lightheadedenes. Negative for headache and neck stiffness. Negative for weakness, altered level of consciousness , altered mental status, extremity weakness, paresthesias, involuntary movement, seizure and syncope.       Allergies  Asa; Codeine; Potassium chloride; and Sulfonamide derivatives  Home Medications   Prior to Admission  medications   Medication Sig Start Date End Date Taking? Authorizing Provider  amLODipine (NORVASC) 5 MG tablet Take 1 tablet (5 mg total) by mouth daily. 12/13/12  Yes Darlin Coco, MD  apixaban (ELIQUIS) 2.5 MG TABS tablet Take 2.5 mg by mouth daily.  12/13/12  Yes Darlin Coco, MD  Calcium Carbonate-Vitamin D (CALCIUM + D PO) Take 2 tablets by mouth 2 (two) times daily.   Yes Historical Provider, MD  cyanocobalamin 500 MCG tablet Take 500 mcg by mouth daily.   Yes Historical Provider, MD  diphenhydrAMINE (BENADRYL) 25 MG tablet Take 25 mg by mouth once as needed for itching (from mosquito bite).   Yes Historical Provider, MD  furosemide (LASIX) 20 MG tablet Take 10 mg by mouth daily. 12/13/12  Yes Darlin Coco, MD  lansoprazole (PREVACID SOLUTAB) 30 MG disintegrating tablet Take 30 mg by mouth daily.   Yes Historical Provider, MD  metoprolol (LOPRESSOR) 50 MG tablet Take 25-50 mg by mouth 2 (two) times daily. Take one tablet in the morning and one-half tablet in the evening. 12/13/12  Yes Darlin Coco, MD  Multiple Vitamins-Minerals (EYE VITAMINS PO) Take 1 tablet by mouth daily.   Yes Historical Provider, MD  zolpidem (AMBIEN) 10 MG tablet Take 2.5 mg by mouth at bedtime.  09/14/11  Yes Historical Provider, MD   BP 128/65  Pulse 66  Temp(Src) 98 F (36.7 C) (Oral)  Resp 12  Ht 5\' 5"  (1.651 m)  Wt 125 lb (56.7 kg)  BMI 20.80 kg/m2  SpO2 97% Physical Exam 1125: Physical examination:  Nursing notes reviewed; Vital signs and O2 SAT reviewed;  Constitutional: Well developed, Well nourished, Well hydrated, In no acute distress; Head:  Normocephalic, atraumatic; Eyes: EOMI, PERRL, No scleral icterus; ENMT: Mouth and pharynx normal, Mucous membranes moist; Neck: Supple, Full range of motion, No lymphadenopathy; Cardiovascular: Irregular irregular rate and rhythm, No gallop; Respiratory: Breath sounds clear & equal bilaterally, No wheezes.  Speaking full sentences with ease, Normal  respiratory effort/excursion; Chest: Nontender, Movement normal; Abdomen: Soft, Nontender, Nondistended, Normal bowel sounds; Genitourinary: No CVA tenderness; Extremities: Pulses normal, No tenderness, No edema, No calf edema or asymmetry.; Neuro: AA&Ox3, Major CN grossly intact. No facial droop. Speech clear. Grips equal. Strength 5/5 equal bilat UE's and LE's. No gross focal motor or sensory deficits in extremities.; Skin: Color normal, Warm, Dry.   ED Course  Procedures     EKG Interpretation   Date/Time:  Tuesday Jun 13 2013 10:21:51 EDT Ventricular Rate:  76 PR Interval:    QRS Duration: 84 QT Interval:  404 QTC Calculation: 454 R Axis:   72 Text Interpretation:  Undetermined rhythm Otherwise normal ECG Confirmed  by [facility]  MD, Calysta Craigo ([id number]) on 06/13/2013 11:50:11 AM     EKG Interpretation  Date/Time:  Tuesday Jun 13 2013 12:32:50 EDT Ventricular Rate:  79 PR Interval:    QRS Duration: 90 QT Interval:  415 QTC Calculation: 476 R Axis:   57 Text Interpretation:  Atrial fibrillation Ventricular premature complex Nonspecific T wave abnormality When compared with ECG of 07/12/2012 Atrial fibrillation is now Present Confirmed by [facility]  MD, Nunzio Cory [phone]) on 06/13/2013 12:48:44 PM           MDM  MDM Reviewed: previous chart, nursing note and vitals Reviewed previous: labs and ECG Interpretation: labs, ECG and x-ray   Results for orders placed during the hospital encounter of 06/13/13  CBC      Result Value Ref Range   WBC 6.1  4.0 - 10.5 K/uL   RBC 3.61 (*) 3.87 - 5.11 MIL/uL   Hemoglobin 10.5 (*) 12.0 - 15.0 g/dL   HCT 32.2 (*) 36.0 - 46.0 %   MCV 89.2  78.0 - 100.0 fL   MCH 29.1  26.0 - 34.0 pg   MCHC 32.6  30.0 - 36.0 g/dL   RDW 15.4  11.5 - 15.5 %   Platelets 198  150 - 400 K/uL  BASIC METABOLIC PANEL      Result Value Ref Range   Sodium 143  137 - 147 mEq/L   Potassium 4.1  3.7 - 5.3 mEq/L   Chloride 105  96 - 112 mEq/L   CO2 26  19 - 32  mEq/L   Glucose, Bld 133 (*) 70 - 99 mg/dL   BUN 21  6 - 23 mg/dL   Creatinine, Ser 0.89  0.50 - 1.10 mg/dL   Calcium 9.0  8.4 - 10.5 mg/dL   GFR calc non Af Amer 55 (*) >90 mL/min   GFR calc Af Amer 64 (*) >90 mL/min  PRO B NATRIURETIC PEPTIDE      Result Value Ref Range   Pro B Natriuretic peptide (BNP) 4974.0 (*) 0 - 450 pg/mL  I-STAT TROPOININ, ED      Result Value Ref Range   Troponin i, poc 0.01  0.00 - 0.08 ng/mL   Comment 3            Dg Chest 2 View 06/13/2013   CLINICAL DATA:  80 year old female with tachycardia. Initial encounter.  EXAM: CHEST  2 VIEW  COMPARISON:  Portable chest radiograph 07/03/2012.  FINDINGS: Interval resolved small left pleural effusion. Stable lung volumes. Stable cardiomegaly and mediastinal contours. No pneumothorax or pulmonary edema. No consolidation or confluent pulmonary opacity. Multilevel mild compression fractures in the thoracic spine. Osteopenia. Mild scoliosis. Calcified atherosclerosis, including involvement of the aorta.  IMPRESSION: Stable cardiomegaly. No acute cardiopulmonary abnormality.   Electronically Signed   By: Lars Pinks M.D.   On: 06/13/2013 11:17    Results for CORISSA, OGUINN (MRN [id number]) as of 06/13/2013 12:49  Ref. Range 07/03/2012 12:00 06/13/2013 11:25  Pro B Natriuretic peptide (BNP) Latest Range: 0-450 pg/mL 3694.0 (H) 4974.0 (H)   Results for JENAE, TOMASELLO (MRN [id number]) as of 06/13/2013 12:49  Ref. Range 02/23/2013 14:05 05/24/2013 13:03 06/13/2013 11:25  Hemoglobin Latest Range: 12.0-15.0 g/dL 9.4 (L) 10.6 (L) 10.5 (L)  HCT Latest Range: 36.0-46.0 % 29.6 (L) 32.3 (L) 32.2 (L)    1255:  Pt ambulated with HR 72. Not orthostatic on VS. EPIC chart reviewed: telephone notes from [facility] Dr. Sherryl Barters office yesterday requesting pt to go to the ED and be admitted for medication adjustments followed by possible TEE/cardioversion. Dx and testing d/w pt and family.  Questions answered.  Verb understanding, agreeable to admit. T/C to  Cardiology, case discussed, including:  HPI, pertinent PM/SHx, VS/PE, dx  testing, ED course and treatment:  Agreeable to come to ED for evaluation to admit.    Alfonzo Feller, DO 06/16/13 2358

## 2013-06-13 NOTE — Telephone Encounter (Signed)
Not sure why this was sent to me.  Can you call?

## 2013-06-13 NOTE — Telephone Encounter (Signed)
noted 

## 2013-06-13 NOTE — ED Notes (Signed)
Pt meal tray ordered, decaf coffee and chicken salad sandwich.

## 2013-06-13 NOTE — ED Notes (Signed)
Anna Elliott, patients daughter [phone]

## 2013-06-14 DIAGNOSIS — I359 Nonrheumatic aortic valve disorder, unspecified: Secondary | ICD-10-CM

## 2013-06-14 DIAGNOSIS — Z7901 Long term (current) use of anticoagulants: Secondary | ICD-10-CM

## 2013-06-14 LAB — CBC
HCT: 31.8 % — ABNORMAL LOW (ref 36.0–46.0)
Hemoglobin: 10.4 g/dL — ABNORMAL LOW (ref 12.0–15.0)
MCH: 29.4 pg (ref 26.0–34.0)
MCHC: 32.7 g/dL (ref 30.0–36.0)
MCV: 89.8 fL (ref 78.0–100.0)
PLATELETS: 164 10*3/uL (ref 150–400)
RBC: 3.54 MIL/uL — AB (ref 3.87–5.11)
RDW: 15.5 % (ref 11.5–15.5)
WBC: 5.1 10*3/uL (ref 4.0–10.5)

## 2013-06-14 LAB — URINE CULTURE: Colony Count: >=100000

## 2013-06-14 LAB — BASIC METABOLIC PANEL
BUN: 24 mg/dL — ABNORMAL HIGH (ref 6–23)
CALCIUM: 8.8 mg/dL (ref 8.4–10.5)
CO2: 27 mEq/L (ref 19–32)
CREATININE: 0.93 mg/dL (ref 0.50–1.10)
Chloride: 103 mEq/L (ref 96–112)
GFR calc Af Amer: 61 mL/min — ABNORMAL LOW (ref >90)
GFR calc non Af Amer: 53 mL/min — ABNORMAL LOW (ref >90)
GLUCOSE: 109 mg/dL — AB (ref 70–99)
Potassium: 4 mEq/L (ref 3.7–5.3)
Sodium: 141 mEq/L (ref 137–147)

## 2013-06-14 LAB — TROPONIN I: Troponin I: <0.3 ng/mL (ref <0.30)

## 2013-06-14 MED ORDER — AMIODARONE HCL 200 MG PO TABS
400.0000 mg | ORAL_TABLET | Freq: Two times a day (BID) | ORAL | Status: DC
  Administered 2013-06-14 – 2013-06-15 (×3): 400 mg via ORAL
  Filled 2013-06-14 (×4): qty 2

## 2013-06-14 MED ORDER — AMIODARONE HCL 200 MG PO TABS: 200.0000 mg | ORAL_TABLET | Freq: Every day | ORAL | Status: DC

## 2013-06-14 MED ORDER — BOOST / RESOURCE BREEZE PO LIQD
1.0000 | Freq: Two times a day (BID) | ORAL | Status: DC
  Administered 2013-06-14 – 2013-06-15 (×3): 1 via ORAL

## 2013-06-14 MED ORDER — LANSOPRAZOLE 15 MG PO TBDP
30.0000 mg | ORAL_TABLET | Freq: Every day | ORAL | Status: DC
  Administered 2013-06-15: 30 mg via ORAL
  Filled 2013-06-14 (×2): qty 2

## 2013-06-14 MED ORDER — AMIODARONE HCL 200 MG PO TABS: 400.0000 mg | ORAL_TABLET | Freq: Every day | ORAL | Status: DC

## 2013-06-14 NOTE — Progress Notes (Signed)
UR completed 

## 2013-06-14 NOTE — Progress Notes (Signed)
Echo Lab  2D Echocardiogram completed.  [place], RDCS 06/14/2013 4:04 PM

## 2013-06-14 NOTE — Progress Notes (Addendum)
    Subjective:  Better this am- less sensation of palpitations  Objective:  Vital Signs in the last 24 hours: Temp:  [97.4 F (36.3 C)-98 F (36.7 C)] 97.6 F (36.4 C) (05/20 0501) Pulse Rate:  [32-129] 115 (05/20 0501) Resp:  [12-20] 16 (05/20 0501) BP: (110-172)/(54-102) 128/68 mmHg (05/20 0501) SpO2:  [95 %-100 %] 98 % (05/20 0501) Weight:  [123 lb 10.9 oz (56.1 kg)-126 lb (57.153 kg)] 123 lb 10.9 oz (56.1 kg) (05/20 0459)  Intake/Output from previous day:  Intake/Output Summary (Last 24 hours) at 06/14/13 0922 Last data filed at 06/14/13 0748  Gross per 24 hour  Intake    720 ml  Output   1650 ml  Net   -930 ml    Physical Exam: General appearance: alert, cooperative, no distress and looks younger than 90 Lungs: clear to auscultation bilaterally Heart: irregularly irregular rhythm and 2/6 systolic murmur   Rate: 676  Rhythm: atrial fibrillation  Lab Results:  Recent Labs  06/13/13 1125 06/14/13 0353  WBC 6.1 5.1  HGB 10.5* 10.4*  PLT 198 164    Recent Labs  06/13/13 1125 06/14/13 0353  NA 143 141  K 4.1 4.0  CL 105 103  CO2 26 27  GLUCOSE 133* 109*  BUN 21 24*  CREATININE 0.89 0.93    Recent Labs  06/13/13 2122 06/14/13 0353  TROPONINI <0.30 <0.30   No results found for this basename: INR,  in the last 72 hours  Imaging: Dg Chest 2 View  06/13/2013   CLINICAL DATA:  80 year old female with tachycardia. Initial encounter.  EXAM: CHEST  2 VIEW  COMPARISON:  Portable chest radiograph 07/03/2012.  FINDINGS: Interval resolved small left pleural effusion. Stable lung volumes. Stable cardiomegaly and mediastinal contours. No pneumothorax or pulmonary edema. No consolidation or confluent pulmonary opacity. Multilevel mild compression fractures in the thoracic spine. Osteopenia. Mild scoliosis. Calcified atherosclerosis, including involvement of the aorta.  IMPRESSION: Stable cardiomegaly. No acute cardiopulmonary abnormality.   Electronically Signed    By: Lars Pinks M.D.   On: 06/13/2013 11:17    Cardiac Studies:  Assessment/Plan:   80 y.o. female with a history of HTN, chronic anemia, hx of rheumatic fever, PAF on eliquis (at half dose) tubular adenoma of the colon and mod AS who presented 06/13/13 with atrial fibrillation with RVR.    Principal Problem:   Atrial fibrillation with rapid ventricular response Active Problems:   Anticoagulated with Eliquis   ANEMIA, CHRONIC   HYPERTENSION   Moderate aortic stenosis    PLAN: Lopressor changed to Diltiazem. Eliquis increased to full dose. Discussed with Dr Irish Lack- start Amiodarone 400 mg BID x 3 days then 400 mg daily. Possible TEE CV Friday.   Kerin Ransom PA-C Beeper [phone] 06/14/2013, 9:22 AM   I have examined the patient and reviewed assessment and plan and discussed with patient.  Agree with above as stated.  Patient feels worse with AFib.  Will start Amiodarone to maintain NSR.  POssible cardioversion later in the week. Moderate AS by prior echo.  Anna Elliott

## 2013-06-14 NOTE — Progress Notes (Signed)
INITIAL NUTRITION ASSESSMENT  DOCUMENTATION CODES Per approved criteria  -Not Applicable   INTERVENTION: Provide Resource Breeze BID Encourage PO intake  NUTRITION DIAGNOSIS: Inadequate oral intake related to decreased appetite as evidenced by 5% weight loss in the past 4 months.   Goal: Pt to meet >/= 90% of their estimated nutrition needs   Monitor:  PO intake, weight trend, labs, supplement acceptance  Reason for Assessment: Malnutrition Screening Tool, score of 2  80 y.o. female  Admitting Dx: Atrial fibrillation with rapid ventricular response  ASSESSMENT: 80 y.o. female with a history of HTN, chronic anemia, hx of rheumatic fever, PAF on eliquis, tubular adenoma of the colon and mod AS who presented to [facility] today with atrial fibrillation with RVR since Saturday morning.   Weight history shows 6 lb weight loss in the past 4 months (5% weight loss). Pt states that about a year ago she was put on a blood-thinning medication that caused her appetite to decrease, causing weight loss. Pt states she recently hasn't been taking her medication and her appetite has improved; she has been eating very well recently. Per nursing notes, pt consumed 100% of her breakfast.  Pt reports she is going to start taking her medication again and she is concerned about appetite changes and losing weight. Pt states she tried drinking Ensure but, is caused her heart to beat faster. Pt states she is not interested in eating more and is interested in trying [employer].   Nutrition Focused Physical Exam:  Subcutaneous Fat:  Orbital Region: wnl Upper Arm Region: moderate wasting Thoracic and Lumbar Region: NA  Muscle:  Temple Region: mild wasting Clavicle Bone Region: moderate wasting Clavicle and Acromion Bone Region: mild wasting Scapular Bone Region: mild wasting Dorsal Hand: mild wasting Patellar Region: mild wasting Anterior Thigh Region: mild wasting Posterior Calf Region:  mild/moderate wasting  Edema: none noted  Height: Ht Readings from Last 1 Encounters:  06/13/13 5\' 4"  (1.626 m)    Weight: Wt Readings from Last 1 Encounters:  06/14/13 123 lb 10.9 oz (56.1 kg)    Ideal Body Weight: 120 lbs  % Ideal Body Weight: 103%  Wt Readings from Last 10 Encounters:  06/14/13 123 lb 10.9 oz (56.1 kg)  05/24/13 125 lb 9.6 oz (56.972 kg)  04/11/13 127 lb (57.607 kg)  02/23/13 128 lb 3.2 oz (58.151 kg)  02/15/13 129 lb (58.514 kg)  12/13/12 131 lb (59.421 kg)  09/30/12 131 lb 11.2 oz (59.739 kg)  09/14/12 132 lb (59.875 kg)  07/21/12 130 lb (58.968 kg)  07/13/12 134 lb 6.4 oz (60.963 kg)    Usual Body Weight: 130 lbs  % Usual Body Weight: 95%  BMI:  Body mass index is 21.22 kg/(m^2).  Estimated Nutritional Needs: Kcal: 1300-1500 Protein: 60-70 grams Fluid: 1.3-1.5 L/day  Skin: WDL  Diet Order: Cardiac  EDUCATION NEEDS: -No education needs identified at this time   Intake/Output Summary (Last 24 hours) at 06/14/13 1004 Last data filed at 06/14/13 0748  Gross per 24 hour  Intake    720 ml  Output   1650 ml  Net   -930 ml    Last BM: 5/19   Labs:   Recent Labs Lab 06/13/13 1125 06/13/13 1547 06/14/13 0353  NA 143  --  141  K 4.1  --  4.0  CL 105  --  103  CO2 26  --  27  BUN 21  --  24*  CREATININE 0.89  --  0.93  CALCIUM 9.0  --  8.8  MG  --  2.2  --   GLUCOSE 133*  --  109*    CBG (last 3)  No results found for this basename: GLUCAP,  in the last 72 hours  Scheduled Meds: . [START ON 06/24/2013] amiodarone  200 mg Oral Daily  . amiodarone  400 mg Oral BID  . [START ON 06/17/2013] amiodarone  400 mg Oral Daily  . apixaban  2.5 mg Oral BID  . cyanocobalamin  500 mcg Oral Daily  . diltiazem  30 mg Oral 4 times per day  . furosemide  10 mg Oral Daily  . zolpidem  2.5 mg Oral QHS    Continuous Infusions:   Past Medical History  Diagnosis Date  . Hypertension   . Moderate aortic stenosis     a. Dx 2011. b.  Echo 05/2012: EF 65%, mild LVH, mod AS mg 40mmHg, mildly dilated LA.  Marland Kitchen Chronic anemia     a. dating back to 2002 - BM bx: early mild dysplasia and/or sideroblastic changes.  Marland Kitchen Dysphagia   . GERD (gastroesophageal reflux disease)   . Esophagitis   . Hiatal hernia   . H/O: rheumatic fever     a. as a child  . Atrial fibrillation     a. Dx 05/2012 unknown duration, started on Eliqiuis.  . Chronic diastolic heart failure   . Tubular adenoma of colon 11/2008    Past Surgical History  Procedure Laterality Date  . Tonsillectomy    . Cesarean section  1962  . Abdominal hysterectomy  1967  . Cataract extraction w/ intraocular lens  implant, bilateral Bilateral 1990's  . Esophageal dilation      "twice in her lifetime, I think; most recent was early 2000's" (06/22/2012)  . Tee without cardioversion N/A 07/04/2012    Procedure: TRANSESOPHAGEAL ECHOCARDIOGRAM (TEE);  Surgeon: Fay Records, MD;  Location: [place];  Service: Cardiovascular;  Laterality: N/A;  talk to [facility]  . Cardioversion N/A 07/04/2012    Procedure: CARDIOVERSION;  Surgeon: Fay Records, MD;  Location: [place];  Service: Cardiovascular;  Laterality: N/A;    Pryor Ochoa RD, LDN Inpatient Clinical Dietitian Pager: [phone] After Hours Pager: [phone]

## 2013-06-15 ENCOUNTER — Other Ambulatory Visit: Payer: Self-pay | Admitting: Physician Assistant

## 2013-06-15 DIAGNOSIS — I4891 Unspecified atrial fibrillation: Secondary | ICD-10-CM

## 2013-06-15 LAB — COMPREHENSIVE METABOLIC PANEL
ALT: 6 U/L (ref 0–35)
AST: 11 U/L (ref 0–37)
Albumin: 3.4 g/dL — ABNORMAL LOW (ref 3.5–5.2)
Alkaline Phosphatase: 58 U/L (ref 39–117)
BUN: 25 mg/dL — ABNORMAL HIGH (ref 6–23)
CO2: 27 mEq/L (ref 19–32)
Calcium: 8.8 mg/dL (ref 8.4–10.5)
Chloride: 104 mEq/L (ref 96–112)
Creatinine, Ser: 1.11 mg/dL — ABNORMAL HIGH (ref 0.50–1.10)
GFR calc Af Amer: 49 mL/min — ABNORMAL LOW (ref >90)
GFR calc non Af Amer: 42 mL/min — ABNORMAL LOW (ref >90)
Glucose, Bld: 103 mg/dL — ABNORMAL HIGH (ref 70–99)
Potassium: 4.1 mEq/L (ref 3.7–5.3)
Sodium: 144 mEq/L (ref 137–147)
Total Bilirubin: 0.8 mg/dL (ref 0.3–1.2)
Total Protein: 5.6 g/dL — ABNORMAL LOW (ref 6.0–8.3)

## 2013-06-15 MED ORDER — APIXABAN 2.5 MG PO TABS: 2.5000 mg | ORAL_TABLET | Freq: Two times a day (BID) | ORAL | Status: DC

## 2013-06-15 MED ORDER — AMIODARONE HCL 200 MG PO TABS: 200.0000 mg | ORAL_TABLET | Freq: Every day | ORAL | Status: DC

## 2013-06-15 MED ORDER — DILTIAZEM HCL ER 120 MG PO CP12: 120.0000 mg | ORAL_CAPSULE | Freq: Two times a day (BID) | ORAL | Status: DC

## 2013-06-15 NOTE — Progress Notes (Signed)
Subjective: Does not feel any worse.  Objective: Vital signs in last 24 hours: Temp:  [97.8 F (36.6 C)-98 F (36.7 C)] 97.8 F (36.6 C) (05/21 0627) Pulse Rate:  [67-81] 81 (05/21 0627) Resp:  [17-18] 17 (05/21 0627) BP: (116-143)/(44-68) 120/48 mmHg (05/21 0627) SpO2:  [94 %-99 %] 94 % (05/21 0627) Weight:  [124 lb 12.5 oz (56.6 kg)] 124 lb 12.5 oz (56.6 kg) (05/21 0627) Last BM Date: 06/14/13  Intake/Output from previous day: 05/20 0701 - 05/21 0700 In: 1180 [P.O.:1180] Out: 1225 [Urine:1225] Intake/Output this shift:    Medications Current Facility-Administered Medications  Medication Dose Route Frequency Provider Last Rate Last Dose  . [START ON 06/24/2013] amiodarone (PACERONE) tablet 200 mg  200 mg Oral Daily Luke K Kilroy, PA-C      . amiodarone (PACERONE) tablet 400 mg  400 mg Oral BID Erlene Quan, PA-C   400 mg at 06/14/13 2214  . [START ON 06/17/2013] amiodarone (PACERONE) tablet 400 mg  400 mg Oral Daily Luke K Kilroy, PA-C      . apixaban (ELIQUIS) tablet 2.5 mg  2.5 mg Oral BID Perry Mount, PA-C   2.5 mg at 06/14/13 2214  . cyanocobalamin tablet 500 mcg  500 mcg Oral Daily Perry Mount, PA-C   500 mcg at 06/14/13 1407  . diltiazem (CARDIZEM) tablet 30 mg  30 mg Oral 4 times per day Perry Mount, PA-C   30 mg at 06/15/13 0654  . feeding supplement (RESOURCE BREEZE) (RESOURCE BREEZE) liquid 1 Container  1 Container Oral BID PC Baird Lyons, RD   1 Container at 06/14/13 1408  . furosemide (LASIX) tablet 10 mg  10 mg Oral Daily Perry Mount, PA-C   10 mg at 06/14/13 1041  . lansoprazole (PREVACID SOLUTAB) disintegrating tablet 30 mg  30 mg Oral QAC breakfast Lelon Perla, MD   30 mg at 06/15/13 1937  . zolpidem (AMBIEN) tablet 2.5 mg  2.5 mg Oral QHS Perry Mount, PA-C   2.5 mg at 06/14/13 2214    PE: General appearance: alert, cooperative and no distress.  The patient is sitting comfortably in the chair.   Lungs: clear to auscultation  bilaterally Heart: irregularly irregular rhythm and 2-3/6 sys MM Extremities: No LEE Pulses: 2+ and symmetric Skin: Warm and dry Neurologic: Grossly normal  Lab Results:   Recent Labs  06/13/13 1125 06/14/13 0353  WBC 6.1 5.1  HGB 10.5* 10.4*  HCT 32.2* 31.8*  PLT 198 164   BMET  Recent Labs  06/13/13 1125 06/14/13 0353 06/15/13 0516  NA 143 141 144  K 4.1 4.0 4.1  CL 105 103 104  CO2 26 27 27   GLUCOSE 133* 109* 103*  BUN 21 24* 25*  CREATININE 0.89 0.93 1.11*  CALCIUM 9.0 8.8 8.8    Assessment/Plan   Principal Problem:   Atrial fibrillation with rapid ventricular response Active Problems:   ANEMIA, CHRONIC   HYPERTENSION   Moderate aortic stenosis   Anticoagulated with Eliquis  Plan:  Rate well controlled.  Dilt 30mg  Q6hr.  Amiodarone added yesterday.  Echo completed and read pending.   On eliquis.   SCr increased slightly.  TEE/DCCV tomorrow.   LOS: 2 days    Tarri Fuller PA-C 06/15/2013 9:22 AM  I have examined the patient and reviewed assessment and plan and discussed with patient.  Agree with above as stated.  Plan for TEE/CV tomorrow.  Offered to do CV in 3 weeks wihtout TEE since she  is tolerating heart rate but patietn and daughter prefer having CV tomorrow.   Jettie Booze

## 2013-06-15 NOTE — Progress Notes (Addendum)
Unable to accommodate TEE/DCCV on the scheduled tomorrow. D/w Dr. Irish Lack. Will need to consider scheduling for Tuesday instead. Dr. Irish Lack offered alternative plan for CV in 3 weeks without TEE since she is tolerating heart rate but patient and daughter expressed preference for CV. Pt's daughter is not in room (getting food) so I have asked nursing to please let her know that we have tentatively moved this to Tuesday. We will reassess in AM for plan. If they desire to stay for TEE/DCCV this will need to be scheduled. Tomorrow AM will make 6th dose of Xarelto. Julius Matus PA-C

## 2013-06-15 NOTE — Progress Notes (Signed)
Anna Elliott to be D/C'd Home per MD order.  Discussed with the patient and all questions fully answered.    Medication List    STOP taking these medications       amLODipine 5 MG tablet  Commonly known as:  NORVASC     metoprolol 50 MG tablet  Commonly known as:  LOPRESSOR      TAKE these medications       amiodarone 200 MG tablet  Commonly known as:  PACERONE  Take 1 tablet (200 mg total) by mouth daily.  Start taking on:  06/24/2013     apixaban 2.5 MG Tabs tablet  Commonly known as:  ELIQUIS  Take 1 tablet (2.5 mg total) by mouth 2 (two) times daily.     CALCIUM + D PO  Take 2 tablets by mouth 2 (two) times daily.     cyanocobalamin 500 MCG tablet  Take 500 mcg by mouth daily.     diltiazem 120 MG 12 hr capsule  Commonly known as:  CARDIZEM SR  Take 1 capsule (120 mg total) by mouth 2 (two) times daily.     diphenhydrAMINE 25 MG tablet  Commonly known as:  BENADRYL  Take 25 mg by mouth once as needed for itching (from mosquito bite).     EYE VITAMINS PO  Take 1 tablet by mouth daily.     furosemide 20 MG tablet  Commonly known as:  LASIX  Take 10 mg by mouth daily.     lansoprazole 30 MG disintegrating tablet  Commonly known as:  PREVACID SOLUTAB  Take 30 mg by mouth daily.     zolpidem 10 MG tablet  Commonly known as:  AMBIEN  Take 2.5 mg by mouth at bedtime.        VVS, Skin clean, dry and intact without evidence of skin break down, no evidence of skin tears noted. IV catheter discontinued intact. Site without signs and symptoms of complications. Dressing and pressure applied.  An After Visit Summary was printed and given to the patient.  D/c education completed with patient/family including follow up instructions, medication list, d/c activities limitations if indicated, with other d/c instructions as indicated by MD - patient able to verbalize understanding, all questions fully answered.   Patient instructed to return to ED, call 911, or call MD  for any changes in condition.   Patient escorted via [place], and D/C home via private auto.  [name] A Cyril Woodmansee 06/15/2013 8:38 PM

## 2013-06-15 NOTE — Progress Notes (Signed)
TEE/DCCV scheduled for 5/26 at 9am. Pt to arrive 1 1/2 hrs before procedure. Should be NPO after midnight. I wrote orders in EPIC. Dayna Dunn PA-C

## 2013-06-16 ENCOUNTER — Telehealth: Payer: Self-pay | Admitting: Physician Assistant

## 2013-06-16 ENCOUNTER — Telehealth: Payer: Self-pay | Admitting: *Deleted

## 2013-06-16 ENCOUNTER — Other Ambulatory Visit: Payer: Self-pay | Admitting: Physician Assistant

## 2013-06-16 MED ORDER — DILTIAZEM HCL 60 MG PO TABS: 60.0000 mg | ORAL_TABLET | Freq: Three times a day (TID) | ORAL | Status: DC

## 2013-06-16 NOTE — Telephone Encounter (Signed)
Per  Marlowe Kays .Marland Kitchen...was prescribe Diltiazem 12 hr extended release 120 mg . And it is on back order and would like what else would he like to prescribe for her .Marland Kitchen Please Call

## 2013-06-16 NOTE — Telephone Encounter (Signed)
Per Gaspar Bidding he changed patient's prescription to the short acting diltiazem. Patient and pharmacy will be notified.

## 2013-06-16 NOTE — Telephone Encounter (Signed)
Informed patient's daughter per Gaspar Bidding the long-acting diltiazem is on back order per pharmacy. He substituted with the short-acting formula. Daughter states the pharmacy has already notified her of the change.

## 2013-06-16 NOTE — Discharge Summary (Signed)
Physician Discharge Summary     Patient ID: [name] MRN: [id number] DOB/AGE: 30-Apr-1921 80 y.o.  Admit date: 06/13/2013 Discharge date: 06/16/2013  Admission Diagnoses:  Atrial fibrillation with rapid ventricular response  Discharge Diagnoses:  Principal Problem:   Atrial fibrillation with rapid ventricular response Active Problems:   ANEMIA, CHRONIC   HYPERTENSION   Moderate aortic stenosis   Anticoagulated with Eliquis   Discharged Condition: stable  Hospital Course:   Anna Elliott is a 80 y.o. female with a history of HTN, chronic anemia, hx of rheumatic fever, PAF on eliquis, tubular adenoma of the colon and mod AS who presented to [facility] with atrial fibrillation with RVR since Saturday morning.   She was admitted for atrial fibrillation with RVR in 06/2012 and underwent successful TEE/DCCV. She converted to normal sinus rhythm with 200 J synchronized biphasic energy. She was discharged the next day maintaining sinus rhythm with first degree AV block. She has been on Eliquis since her first admission. Echocardiogram at that time on 06/23/12 showed an ejection fraction of 65% and moderate aortic stenosis with peak systolic gradient 42 and mean systolic gradient 23. During her last follow up with Dr. Mare Ferrari she complained of GI issues and loss of appetite due to Wade and her dose was cut to 2.5 mg 1x daily with resolution of her symptoms. Her daughter had further decreased her dose to 1.25 mg daily without consulting a MD. She has verbalized understanding of the risks of subtheraputic anticoagulation.   The patient reported taking a benadryl Friday night for a mosquito bite and woke up Saturday with palpitations. She was feeling okay and able to complete her ADLS so she refused to come to the ED. Her HR was reportedly 140 with BP 154/85 on Saturday morning. However, this morning she had palpitations, weakness, lightheadedess and unsteady gait and decided to come into the ED. Her  HR upon awakening was 117 bmp and she took an extra metoprolol this AM (50mg  instead of 25mg ) as recommended by Cecilie Kicks PA-C by telephone. She called Dr. Mare Ferrari today who recommended that she increase her apixaban to full BID dose to protect against stroke and go to the hospital now for a short admission for stabilization and possible TEE-cardioversion.  No recent falls, orthopnea, PND, LE swelling. No CP.   She was admitted and started on diltiazem 30mg  q6hr.  Lopressor and amlodipine were discontinued.  We increased her eliquis back to a therapeutic dose of 2.5mg  BID.  CXR was clear, Echo revealed normal EF, wall motion, moderate AS, Valve area (Vmax): 0.78 cm^2, LA was moderately to severely dilated, moderte TR.  Amiodarone was loaded with PO and scheduled to taper dose.  The endo lab was booked on Friday so we offered to discharge her home with outpatient TEE/DCCV on Tuesday the following week.  This was arranged.  Cardizem CD was on back order at her pharmacy so she was changed to 60mg  three times daily.  The patient was seen by Dr. Irish Lack who felt she was stable for DC home.   Consults: None  Significant Diagnostic Studies:  Study Conclusions  - Left ventricle: The cavity size was normal. There was mild focal basal hypertrophy of the septum. Systolic function was vigorous. The estimated ejection fraction was in the range of 65% to 70%. Wall motion was normal; there were no regional wall motion abnormalities. The study is not technically sufficient to allow evaluation of LV diastolic function. - Aortic valve: There was moderate stenosis. Valve  area (VTI): 0.71 cm^2. Valve area (Vmax): 0.78 cm^2. - Mitral valve: Moderately to severely calcified annulus. Moderately thickened, mildly calcified leaflets . Mild, late systolicprolapse, involving the posterior leaflet. There was mild regurgitation. Valve area by continuity equation (using LVOT flow): 2.0 cm^2. - Left atrium: The atrium  was moderately to severely dilated. - Tricuspid valve: There was moderate regurgitation. - Pulmonary arteries: PA peak pressure: 43 mm Hg (S).  CHEST 2 VIEW  COMPARISON: Portable chest radiograph 07/03/2012.  FINDINGS: Interval resolved small left pleural effusion. Stable lung volumes. Stable cardiomegaly and mediastinal contours. No pneumothorax or pulmonary edema. No consolidation or confluent pulmonary opacity. Multilevel mild compression fractures in the thoracic spine. Osteopenia. Mild scoliosis. Calcified atherosclerosis, including involvement of the aorta.  IMPRESSION: Stable cardiomegaly. No acute cardiopulmonary abnormality.   Electronically Signed    Treatments: See above  Discharge Exam: Blood pressure 109/69, pulse 103, temperature 97.4 F (36.3 C), temperature source Oral, resp. rate 16, height 5\' 4"  (1.626 m), weight 124 lb 12.5 oz (56.6 kg), SpO2 98.00%.   Disposition: 01-Home or Self Care      Discharge Instructions   Diet - low sodium heart healthy    Complete by:  As directed      Discharge instructions    Complete by:  As directed   See follow up section for procedure information.     Increase activity slowly    Complete by:  As directed             Medication List    STOP taking these medications       amLODipine 5 MG tablet  Commonly known as:  NORVASC     metoprolol 50 MG tablet  Commonly known as:  LOPRESSOR      TAKE these medications       amiodarone 200 MG tablet  Commonly known as:  PACERONE  Take 1 tablet (200 mg total) by mouth daily.  Start taking on:  06/24/2013     apixaban 2.5 MG Tabs tablet  Commonly known as:  ELIQUIS  Take 1 tablet (2.5 mg total) by mouth 2 (two) times daily.     CALCIUM + D PO  Take 2 tablets by mouth 2 (two) times daily.     cyanocobalamin 500 MCG tablet  Take 500 mcg by mouth daily.     diphenhydrAMINE 25 MG tablet  Commonly known as:  BENADRYL  Take 25 mg by mouth once as needed for  itching (from mosquito bite).     EYE VITAMINS PO  Take 1 tablet by mouth daily.     furosemide 20 MG tablet  Commonly known as:  LASIX  Take 10 mg by mouth daily.     lansoprazole 30 MG disintegrating tablet  Commonly known as:  PREVACID SOLUTAB  Take 30 mg by mouth daily.     zolpidem 10 MG tablet  Commonly known as:  AMBIEN  Take 2.5 mg by mouth at bedtime.          Diltiazem 60mg  three times daily  Follow-up Information   Follow up with TEE/DCCV procedure On 06/20/2013. (Be at Short Stay at 7:30AM)     40 minutes spent on this discharge , including time spent going over options with the family  Signed: Tarri Fuller, [facility] 06/16/2013, 3:13 PM  I have examined the patient and reviewed assessment and plan and discussed with patient.  Agree with above as stated.  Unable to do TEE/DCCV on 5/22 due to scheduling issues.  5/25 is a holiday.  Will d/c home and do DCCV on 5/26. Patient is quite comfortable at this time.  Jettie Booze

## 2013-06-18 ENCOUNTER — Inpatient Hospital Stay (HOSPITAL_COMMUNITY)
Admission: EM | Admit: 2013-06-18 | Discharge: 2013-06-23 | DRG: 194 | Disposition: A | Discharge status: Home Health | Payer: Medicare Other | Attending: Internal Medicine | Admitting: Internal Medicine

## 2013-06-18 DIAGNOSIS — Z7901 Long term (current) use of anticoagulants: Secondary | ICD-10-CM

## 2013-06-18 DIAGNOSIS — R1319 Other dysphagia: Secondary | ICD-10-CM

## 2013-06-18 DIAGNOSIS — R0603 Acute respiratory distress: Secondary | ICD-10-CM

## 2013-06-18 DIAGNOSIS — I5032 Chronic diastolic (congestive) heart failure: Secondary | ICD-10-CM

## 2013-06-18 DIAGNOSIS — I10 Essential (primary) hypertension: Secondary | ICD-10-CM

## 2013-06-18 DIAGNOSIS — R142 Eructation: Secondary | ICD-10-CM

## 2013-06-18 DIAGNOSIS — K209 Esophagitis, unspecified without bleeding: Secondary | ICD-10-CM

## 2013-06-18 DIAGNOSIS — N390 Urinary tract infection, site not specified: Secondary | ICD-10-CM | POA: Diagnosis present

## 2013-06-18 DIAGNOSIS — Z807 Family history of other malignant neoplasms of lymphoid, hematopoietic and related tissues: Secondary | ICD-10-CM

## 2013-06-18 DIAGNOSIS — R198 Other specified symptoms and signs involving the digestive system and abdomen: Secondary | ICD-10-CM

## 2013-06-18 DIAGNOSIS — Z885 Allergy status to narcotic agent status: Secondary | ICD-10-CM

## 2013-06-18 DIAGNOSIS — I350 Nonrheumatic aortic (valve) stenosis: Secondary | ICD-10-CM

## 2013-06-18 DIAGNOSIS — K449 Diaphragmatic hernia without obstruction or gangrene: Secondary | ICD-10-CM

## 2013-06-18 DIAGNOSIS — D509 Iron deficiency anemia, unspecified: Secondary | ICD-10-CM | POA: Diagnosis present

## 2013-06-18 DIAGNOSIS — I5033 Acute on chronic diastolic (congestive) heart failure: Secondary | ICD-10-CM

## 2013-06-18 DIAGNOSIS — R143 Flatulence: Secondary | ICD-10-CM

## 2013-06-18 DIAGNOSIS — K5730 Diverticulosis of large intestine without perforation or abscess without bleeding: Secondary | ICD-10-CM

## 2013-06-18 DIAGNOSIS — D539 Nutritional anemia, unspecified: Secondary | ICD-10-CM

## 2013-06-18 DIAGNOSIS — Z800 Family history of malignant neoplasm of digestive organs: Secondary | ICD-10-CM

## 2013-06-18 DIAGNOSIS — R141 Gas pain: Secondary | ICD-10-CM

## 2013-06-18 DIAGNOSIS — Z801 Family history of malignant neoplasm of trachea, bronchus and lung: Secondary | ICD-10-CM

## 2013-06-18 DIAGNOSIS — K921 Melena: Secondary | ICD-10-CM

## 2013-06-18 DIAGNOSIS — K219 Gastro-esophageal reflux disease without esophagitis: Secondary | ICD-10-CM

## 2013-06-18 DIAGNOSIS — R197 Diarrhea, unspecified: Secondary | ICD-10-CM

## 2013-06-18 DIAGNOSIS — Z882 Allergy status to sulfonamides status: Secondary | ICD-10-CM

## 2013-06-18 DIAGNOSIS — I359 Nonrheumatic aortic valve disorder, unspecified: Secondary | ICD-10-CM | POA: Diagnosis present

## 2013-06-18 DIAGNOSIS — I5031 Acute diastolic (congestive) heart failure: Secondary | ICD-10-CM

## 2013-06-18 DIAGNOSIS — Z833 Family history of diabetes mellitus: Secondary | ICD-10-CM

## 2013-06-18 DIAGNOSIS — R1310 Dysphagia, unspecified: Secondary | ICD-10-CM | POA: Diagnosis present

## 2013-06-18 DIAGNOSIS — I509 Heart failure, unspecified: Secondary | ICD-10-CM | POA: Diagnosis present

## 2013-06-18 DIAGNOSIS — D649 Anemia, unspecified: Secondary | ICD-10-CM

## 2013-06-18 DIAGNOSIS — I4891 Unspecified atrial fibrillation: Secondary | ICD-10-CM

## 2013-06-18 DIAGNOSIS — Z8249 Family history of ischemic heart disease and other diseases of the circulatory system: Secondary | ICD-10-CM

## 2013-06-18 DIAGNOSIS — J189 Pneumonia, unspecified organism: Secondary | ICD-10-CM

## 2013-06-19 ENCOUNTER — Emergency Department (HOSPITAL_COMMUNITY): Payer: Medicare Other

## 2013-06-19 ENCOUNTER — Other Ambulatory Visit: Payer: Self-pay

## 2013-06-19 ENCOUNTER — Encounter (HOSPITAL_COMMUNITY): Payer: Self-pay | Admitting: Emergency Medicine

## 2013-06-19 DIAGNOSIS — I359 Nonrheumatic aortic valve disorder, unspecified: Secondary | ICD-10-CM

## 2013-06-19 DIAGNOSIS — I4891 Unspecified atrial fibrillation: Secondary | ICD-10-CM

## 2013-06-19 DIAGNOSIS — J189 Pneumonia, unspecified organism: Principal | ICD-10-CM

## 2013-06-19 DIAGNOSIS — Z7901 Long term (current) use of anticoagulants: Secondary | ICD-10-CM

## 2013-06-19 LAB — COMPREHENSIVE METABOLIC PANEL
ALT: 18 U/L (ref 0–35)
ALT: 15 U/L (ref 0–35)
AST: 23 U/L (ref 0–37)
AST: 15 U/L (ref 0–37)
Albumin: 4 g/dL (ref 3.5–5.2)
Albumin: 3.2 g/dL — ABNORMAL LOW (ref 3.5–5.2)
Alkaline Phosphatase: 87 U/L (ref 39–117)
Alkaline Phosphatase: 70 U/L (ref 39–117)
BUN: 21 mg/dL (ref 6–23)
BUN: 20 mg/dL (ref 6–23)
CALCIUM: 8.4 mg/dL (ref 8.4–10.5)
CO2: 28 mEq/L (ref 19–32)
CO2: 26 meq/L (ref 19–32)
Calcium: 9 mg/dL (ref 8.4–10.5)
Chloride: 103 mEq/L (ref 96–112)
Chloride: 101 mEq/L (ref 96–112)
Creatinine, Ser: 0.94 mg/dL (ref 0.50–1.10)
Creatinine, Ser: 0.89 mg/dL (ref 0.50–1.10)
GFR calc Af Amer: 64 mL/min — ABNORMAL LOW (ref >90)
GFR calc non Af Amer: 52 mL/min — ABNORMAL LOW (ref >90)
GFR, EST AFRICAN AMERICAN: 60 mL/min — AB (ref >90)
GFR, EST NON AFRICAN AMERICAN: 55 mL/min — AB (ref >90)
GLUCOSE: 111 mg/dL — AB (ref 70–99)
Glucose, Bld: 133 mg/dL — ABNORMAL HIGH (ref 70–99)
POTASSIUM: 3.7 meq/L (ref 3.7–5.3)
POTASSIUM: 3.7 meq/L (ref 3.7–5.3)
SODIUM: 140 meq/L (ref 137–147)
Sodium: 144 mEq/L (ref 137–147)
TOTAL PROTEIN: 5.6 g/dL — AB (ref 6.0–8.3)
Total Bilirubin: 1.3 mg/dL — ABNORMAL HIGH (ref 0.3–1.2)
Total Bilirubin: 1.9 mg/dL — ABNORMAL HIGH (ref 0.3–1.2)
Total Protein: 6.8 g/dL (ref 6.0–8.3)

## 2013-06-19 LAB — CBC WITH DIFFERENTIAL/PLATELET
Basophils Absolute: 0 10*3/uL (ref 0.0–0.1)
Basophils Absolute: 0 10*3/uL (ref 0.0–0.1)
Basophils Relative: 0 % (ref 0–1)
Basophils Relative: 0 % (ref 0–1)
EOS ABS: 0 10*3/uL (ref 0.0–0.7)
Eosinophils Absolute: 0 10*3/uL (ref 0.0–0.7)
Eosinophils Relative: 0 % (ref 0–5)
Eosinophils Relative: 0 % (ref 0–5)
HCT: 25.1 % — ABNORMAL LOW (ref 36.0–46.0)
HEMATOCRIT: 30.8 % — AB (ref 36.0–46.0)
Hemoglobin: 10.1 g/dL — ABNORMAL LOW (ref 12.0–15.0)
Hemoglobin: 8.3 g/dL — ABNORMAL LOW (ref 12.0–15.0)
LYMPHS ABS: 0.4 10*3/uL — AB (ref 0.7–4.0)
LYMPHS ABS: 0.4 10*3/uL — AB (ref 0.7–4.0)
LYMPHS PCT: 4 % — AB (ref 12–46)
Lymphocytes Relative: 4 % — ABNORMAL LOW (ref 12–46)
MCH: 29.4 pg (ref 26.0–34.0)
MCH: 29.7 pg (ref 26.0–34.0)
MCHC: 32.8 g/dL (ref 30.0–36.0)
MCHC: 33.1 g/dL (ref 30.0–36.0)
MCV: 89.8 fL (ref 78.0–100.0)
MCV: 90 fL (ref 78.0–100.0)
MONO ABS: 1 10*3/uL (ref 0.1–1.0)
MONOS PCT: 10 % (ref 3–12)
MONOS PCT: 9 % (ref 3–12)
Monocytes Absolute: 0.8 10*3/uL (ref 0.1–1.0)
NEUTROS PCT: 88 % — AB (ref 43–77)
Neutro Abs: 9.3 10*3/uL — ABNORMAL HIGH (ref 1.7–7.7)
Neutro Abs: 8.1 10*3/uL — ABNORMAL HIGH (ref 1.7–7.7)
Neutrophils Relative %: 86 % — ABNORMAL HIGH (ref 43–77)
PLATELETS: 144 10*3/uL — AB (ref 150–400)
Platelets: 188 10*3/uL (ref 150–400)
RBC: 3.43 MIL/uL — AB (ref 3.87–5.11)
RBC: 2.79 MIL/uL — AB (ref 3.87–5.11)
RDW: 15.9 % — ABNORMAL HIGH (ref 11.5–15.5)
RDW: 15.8 % — ABNORMAL HIGH (ref 11.5–15.5)
WBC: 10.8 10*3/uL — ABNORMAL HIGH (ref 4.0–10.5)
WBC: 9.3 10*3/uL (ref 4.0–10.5)

## 2013-06-19 LAB — URINALYSIS, ROUTINE W REFLEX MICROSCOPIC
Bilirubin Urine: NEGATIVE
Glucose, UA: NEGATIVE mg/dL
Hgb urine dipstick: NEGATIVE
KETONES UR: NEGATIVE mg/dL
LEUKOCYTES UA: NEGATIVE
NITRITE: NEGATIVE
PH: 5.5 (ref 5.0–8.0)
Protein, ur: NEGATIVE mg/dL
Specific Gravity, Urine: 1.022 (ref 1.005–1.030)
Urobilinogen, UA: 1 mg/dL (ref 0.0–1.0)

## 2013-06-19 LAB — I-STAT VENOUS BLOOD GAS, ED
ACID-BASE EXCESS: 4 mmol/L — AB (ref 0.0–2.0)
BICARBONATE: 28.6 meq/L — AB (ref 20.0–24.0)
O2 Saturation: 85 %
Patient temperature: 99.1
TCO2: 30 mmol/L (ref 0–100)
pCO2, Ven: 44.8 mmHg — ABNORMAL LOW (ref 45.0–50.0)
pH, Ven: 7.414 — ABNORMAL HIGH (ref 7.250–7.300)
pO2, Ven: 51 mmHg — ABNORMAL HIGH (ref 30.0–45.0)

## 2013-06-19 LAB — CBC
HEMATOCRIT: 26.1 % — AB (ref 36.0–46.0)
HEMOGLOBIN: 8.8 g/dL — AB (ref 12.0–15.0)
MCH: 29.8 pg (ref 26.0–34.0)
MCHC: 33.7 g/dL (ref 30.0–36.0)
MCV: 88.5 fL (ref 78.0–100.0)
Platelets: 160 10*3/uL (ref 150–400)
RBC: 2.95 MIL/uL — AB (ref 3.87–5.11)
RDW: 16 % — ABNORMAL HIGH (ref 11.5–15.5)
WBC: 10.7 10*3/uL — AB (ref 4.0–10.5)

## 2013-06-19 LAB — PRO B NATRIURETIC PEPTIDE: PRO B NATRI PEPTIDE: 2094 pg/mL — AB (ref 0–450)

## 2013-06-19 LAB — I-STAT TROPONIN, ED: Troponin i, poc: 0.01 ng/mL (ref 0.00–0.08)

## 2013-06-19 LAB — I-STAT CG4 LACTIC ACID, ED: Lactic Acid, Venous: 0.9 mmol/L (ref 0.5–2.2)

## 2013-06-19 MED ORDER — ACETAMINOPHEN 325 MG PO TABS
650.0000 mg | ORAL_TABLET | Freq: Four times a day (QID) | ORAL | Status: DC | PRN
  Administered 2013-06-19: 650 mg via ORAL
  Filled 2013-06-19 (×3): qty 2

## 2013-06-19 MED ORDER — ONDANSETRON HCL 4 MG PO TABS: 4.0000 mg | ORAL_TABLET | Freq: Four times a day (QID) | ORAL | Status: DC | PRN

## 2013-06-19 MED ORDER — LEVALBUTEROL HCL 0.63 MG/3ML IN NEBU
0.6300 mg | INHALATION_SOLUTION | Freq: Four times a day (QID) | RESPIRATORY_TRACT | Status: DC | PRN
  Administered 2013-06-19 – 2013-06-22 (×2): 0.63 mg via RESPIRATORY_TRACT
  Filled 2013-06-19: qty 3

## 2013-06-19 MED ORDER — SODIUM CHLORIDE 0.9 % IJ SOLN
3.0000 mL | Freq: Two times a day (BID) | INTRAMUSCULAR | Status: DC
  Administered 2013-06-19 – 2013-06-23 (×5): 3 mL via INTRAVENOUS

## 2013-06-19 MED ORDER — DILTIAZEM HCL 60 MG PO TABS
60.0000 mg | ORAL_TABLET | Freq: Three times a day (TID) | ORAL | Status: DC
  Administered 2013-06-19 – 2013-06-23 (×13): 60 mg via ORAL
  Filled 2013-06-19 (×15): qty 1

## 2013-06-19 MED ORDER — VANCOMYCIN HCL IN DEXTROSE 750-5 MG/150ML-% IV SOLN
750.0000 mg | INTRAVENOUS | Status: DC
  Administered 2013-06-20 – 2013-06-21 (×2): 750 mg via INTRAVENOUS
  Filled 2013-06-19 (×2): qty 150

## 2013-06-19 MED ORDER — VANCOMYCIN HCL IN DEXTROSE 1-5 GM/200ML-% IV SOLN
1000.0000 mg | Freq: Once | INTRAVENOUS | Status: AC
  Administered 2013-06-19: 1000 mg via INTRAVENOUS
  Filled 2013-06-19: qty 200

## 2013-06-19 MED ORDER — LEVALBUTEROL HCL 1.25 MG/0.5ML IN NEBU: 1.2500 mg | INHALATION_SOLUTION | Freq: Four times a day (QID) | RESPIRATORY_TRACT | Status: DC | PRN

## 2013-06-19 MED ORDER — CYANOCOBALAMIN 500 MCG PO TABS
500.0000 ug | ORAL_TABLET | Freq: Every day | ORAL | Status: DC
  Administered 2013-06-19 – 2013-06-23 (×5): 500 ug via ORAL
  Filled 2013-06-19 (×5): qty 1

## 2013-06-19 MED ORDER — APIXABAN 2.5 MG PO TABS
2.5000 mg | ORAL_TABLET | Freq: Two times a day (BID) | ORAL | Status: DC
  Administered 2013-06-19 – 2013-06-20 (×4): 2.5 mg via ORAL
  Filled 2013-06-19 (×6): qty 1

## 2013-06-19 MED ORDER — ALBUTEROL SULFATE (2.5 MG/3ML) 0.083% IN NEBU
5.0000 mg | INHALATION_SOLUTION | Freq: Once | RESPIRATORY_TRACT | Status: AC
Start: 2013-06-19 — End: 2013-06-19
  Administered 2013-06-19: 5 mg via RESPIRATORY_TRACT
  Filled 2013-06-19: qty 6

## 2013-06-19 MED ORDER — BUDESONIDE 0.25 MG/2ML IN SUSP
0.2500 mg | Freq: Two times a day (BID) | RESPIRATORY_TRACT | Status: DC
  Administered 2013-06-19 – 2013-06-23 (×7): 0.25 mg via RESPIRATORY_TRACT
  Filled 2013-06-19 (×10): qty 2

## 2013-06-19 MED ORDER — LANSOPRAZOLE 30 MG PO TBDP: 30.0000 mg | ORAL_TABLET | Freq: Every day | ORAL | Status: DC

## 2013-06-19 MED ORDER — ZOLPIDEM TARTRATE 5 MG PO TABS
5.0000 mg | ORAL_TABLET | Freq: Every day | ORAL | Status: DC
  Administered 2013-06-19: 5 mg via ORAL
  Filled 2013-06-19 (×2): qty 1

## 2013-06-19 MED ORDER — ACETAMINOPHEN 325 MG PO TABS
650.0000 mg | ORAL_TABLET | Freq: Four times a day (QID) | ORAL | Status: DC | PRN
  Administered 2013-06-19 – 2013-06-21 (×2): 650 mg via ORAL

## 2013-06-19 MED ORDER — ACETAMINOPHEN 650 MG RE SUPP: 650.0000 mg | Freq: Four times a day (QID) | RECTAL | Status: DC | PRN

## 2013-06-19 MED ORDER — SODIUM CHLORIDE 0.9 % IJ SOLN
3.0000 mL | Freq: Two times a day (BID) | INTRAMUSCULAR | Status: DC
  Administered 2013-06-19 – 2013-06-23 (×6): 3 mL via INTRAVENOUS

## 2013-06-19 MED ORDER — LEVALBUTEROL HCL 0.63 MG/3ML IN NEBU
INHALATION_SOLUTION | RESPIRATORY_TRACT | Status: AC
  Filled 2013-06-19: qty 3

## 2013-06-19 MED ORDER — DEXTROSE 5 % IV SOLN
1.0000 g | INTRAVENOUS | Status: DC
  Administered 2013-06-19 – 2013-06-20 (×2): 1 g via INTRAVENOUS
  Filled 2013-06-19 (×3): qty 1

## 2013-06-19 MED ORDER — FUROSEMIDE 20 MG PO TABS
20.0000 mg | ORAL_TABLET | Freq: Every day | ORAL | Status: DC
  Administered 2013-06-19: 10 mg via ORAL
  Administered 2013-06-20 – 2013-06-23 (×4): 20 mg via ORAL
  Filled 2013-06-19 (×4): qty 1

## 2013-06-19 MED ORDER — ONDANSETRON HCL 4 MG/2ML IJ SOLN: 4.0000 mg | Freq: Four times a day (QID) | INTRAMUSCULAR | Status: DC | PRN

## 2013-06-19 MED ORDER — FUROSEMIDE 20 MG PO TABS
10.0000 mg | ORAL_TABLET | Freq: Every day | ORAL | Status: DC
  Administered 2013-06-19: 10 mg via ORAL
  Filled 2013-06-19 (×2): qty 0.5

## 2013-06-19 MED ORDER — PANTOPRAZOLE SODIUM 40 MG PO TBEC
40.0000 mg | DELAYED_RELEASE_TABLET | Freq: Every day | ORAL | Status: DC
  Administered 2013-06-19: 40 mg via ORAL
  Filled 2013-06-19 (×2): qty 1

## 2013-06-19 MED ORDER — PIPERACILLIN-TAZOBACTAM IN DEX 2-0.25 GM/50ML IV SOLN
2.2500 g | Freq: Once | INTRAVENOUS | Status: AC
  Administered 2013-06-19: 2.25 g via INTRAVENOUS
  Filled 2013-06-19: qty 50

## 2013-06-19 MED ORDER — FUROSEMIDE 20 MG PO TABS
10.0000 mg | ORAL_TABLET | Freq: Every day | ORAL | Status: DC
  Filled 2013-06-19: qty 1

## 2013-06-19 NOTE — Progress Notes (Signed)
Patient seen and examined. Admitted after midnight secondary to shortness of breath and fever. Found to have infiltrates in her chest x-ray suggesting pneumonia the patient has just been recently discharged from the hospital. She has been admitted for treatment of healthcare associated pneumonia. Patient denies nausea, vomiting, abdominal pain, diarrhea, chest pain or any other acute complaints. She continued to experience episodes of palpitations and while monitoring her telemetry she is in and out of A. Fib. For further details of/info on admission please refer to H&P created by Dr. Hal Hope.  Plan: -Vancomycin and cefepime for treatment of healthcare assess her pneumonia -Pulmicort BID and flutter valve -will consult cardiology for final decisions on her A. fib -continue supportive care  [facility] [phone]

## 2013-06-19 NOTE — Progress Notes (Signed)
06/19/13 1655  Educated patient on Flutter valve use. Pt demonstrated and verbalized understanding.

## 2013-06-19 NOTE — Consult Note (Signed)
The patient was seen and examined, and I agree with the assessment and plan as documented above, with modifications as noted below. Pt with history as noted above. Pt lives with daughter and son-in-law. Family is convinced current illness is related to amiodarone, and they do not want her taking it anymore. Unfortunately, pneumonia is a substrate for atrial fibrillation with a high chance of recurrence. She feels better and is much less short of breath and is on antibiotics and oxygen. Denies chest pain and palpitations. She is currently in sinus rhythm/sinus tachycardia. I have not seen any evidence for atrial fibrillation. Would recommend continuing diltiazem and Eliquis for now. She is chronically anemic and denies any recent hematochezia/melena, but has had Hgb drop. Check stool guiacs. No need to transfuse at present.

## 2013-06-19 NOTE — Progress Notes (Signed)
ANTIBIOTIC CONSULT NOTE - INITIAL  Pharmacy Consult for Vancomycin/Cefepime Indication: pneumonia  Allergies  Allergen Reactions  . Asa [Aspirin] Other (See Comments)    Acid reflux  . Codeine Other (See Comments)    Makes her sick  . Potassium Chloride     Loss of appetite  . Sulfonamide Derivatives Itching and Rash   Patient Measurements: Height: 5\' 5"  (165.1 cm) Weight: 126 lb 9.6 oz (57.425 kg) (bed scale) IBW/kg (Calculated) : 57  Vital Signs: Temp: 99.8 F (37.7 C) (05/25 0530) Temp src: Oral (05/25 0530) BP: 147/58 mmHg (05/25 0530) Pulse Rate: 99 (05/25 0530)  Labs:  Recent Labs  06/19/13 0144  WBC 10.8*  HGB 10.1*  PLT 188  CREATININE 0.94   Estimated Creatinine Clearance: 35.8 ml/min (by C-G formula based on Cr of 0.94).  Microbiology: Recent Results (from the past 720 hour(s))  URINE CULTURE     Status: None   Collection Time    06/13/13 12:17 PM      Result Value Ref Range Status   Specimen Description URINE, CLEAN CATCH   Final   Special Requests NONE   Final   Culture  Setup Time     Final   Value: 06/13/2013 16:30     Performed at [facility] Count     Final   Value: >=100,000 COLONIES/ML     Performed at [facility]   Culture     Final   Value: Multiple bacterial morphotypes present, none predominant. Suggest appropriate recollection if clinically indicated.     Performed at [facility]   Report Status 06/14/2013 FINAL   Final   Medical History: Past Medical History  Diagnosis Date  . Hypertension   . Moderate aortic stenosis     a. Dx 2011. b. Echo 05/2012: EF 65%, mild LVH, mod AS mg 68mmHg, mildly dilated LA.  Marland Kitchen Chronic anemia     a. dating back to 2002 - BM bx: early mild dysplasia and/or sideroblastic changes.  Marland Kitchen Dysphagia   . GERD (gastroesophageal reflux disease)   . Esophagitis   . Hiatal hernia   . H/O: rheumatic fever     a. as a child  . Atrial fibrillation     a. Dx 05/2012 unknown  duration, started on Eliqiuis.  . Chronic diastolic heart failure   . Tubular adenoma of colon 11/2008   Medications:  Anti-infectives   Start     Dose/Rate Route Frequency Ordered Stop   06/19/13 0330  piperacillin-tazobactam (ZOSYN) IVPB 2.25 g     2.25 g 100 mL/hr over 30 Minutes Intravenous  Once 06/19/13 0324 06/19/13 0642   06/19/13 0330  vancomycin (VANCOCIN) IVPB 1000 mg/200 mL premix     1,000 mg 200 mL/hr over 60 Minutes Intravenous  Once 06/19/13 0324 06/19/13 0506     Assessment: 80yo female with complaints of chills, headache and temp.  She has had a CXR that reveals some opacities that may be reflective of pneumonia.  We have been asked to dose her IV Cefepime and Vancomycin to provide broad spectrum coverage.  She has a normal creatinine for her age with an estimated crcl of 46ml/min.  She has an elevated WBC at 10.8K.  She has received one dose of IV Vancomycin and IV Zosyn at ~ 3:30 AM without noted complications.  She has no antibiotic allergies noted  Goal of Therapy:  Vancomycin trough level 15-20 mcg/ml  Plan:  1.  Will begin  IV Vancomycin 750 mg every 24 hours with dose 5/26 2.  Begin Cefepime 1 gm IV every 24 hours 3.  Monitor renal function, culture data, clinical response  Rober Minion, PharmD., MS Clinical Pharmacist Pager:  [phone] Thank you for allowing pharmacy to be part of this patients care team. 06/19/2013,7:07 AM

## 2013-06-19 NOTE — ED Provider Notes (Addendum)
CSN: [id number]     Arrival date & time 06/18/13  2357 History   First MD Initiated Contact with Patient 06/19/13 0200     Chief Complaint  Patient presents with  . flu-like symptoms      (Consider location/radiation/quality/duration/timing/severity/associated sxs/prior Treatment) HPI  Anna Elliott is an elderly woman with moderate AS, HTN, chronic anemia, chronic atrial fibrillation, diastolic cardiomyopathy, GERD and recent hospitalization for AF with RVR (discharged 4 days ago).    Patient awoke this morning with wheezing. She has had increasingly severe SOB with continued wheezing throughout the day. She denies cough and chest pain. She has felt chilled but has not checked her temperature with a thermometer. She has been compliant with all meds prescribed at discharge. No LE edema.   Patient has no history of wheezing or any chronic lung disease. Dtr says she was placed on supplemental 02 at her house by paramedics. Although, she does not recall the patient's oxygen saturations on RA.   Past Medical History  Diagnosis Date  . Hypertension   . Moderate aortic stenosis     a. Dx 2011. b. Echo 05/2012: EF 65%, mild LVH, mod AS mg 21mmHg, mildly dilated LA.  Marland Kitchen Chronic anemia     a. dating back to 2002 - BM bx: early mild dysplasia and/or sideroblastic changes.  Marland Kitchen Dysphagia   . GERD (gastroesophageal reflux disease)   . Esophagitis   . Hiatal hernia   . H/O: rheumatic fever     a. as a child  . Atrial fibrillation     a. Dx 05/2012 unknown duration, started on Eliqiuis.  . Chronic diastolic heart failure   . Tubular adenoma of colon 11/2008   Past Surgical History  Procedure Laterality Date  . Tonsillectomy    . Cesarean section  1962  . Abdominal hysterectomy  1967  . Cataract extraction w/ intraocular lens  implant, bilateral Bilateral 1990's  . Esophageal dilation      "twice in her lifetime, I think; most recent was early 2000's" (06/22/2012)  . Tee without cardioversion N/A  07/04/2012    Procedure: TRANSESOPHAGEAL ECHOCARDIOGRAM (TEE);  Surgeon: Fay Records, MD;  Location: [place];  Service: Cardiovascular;  Laterality: N/A;  talk to [facility]  . Cardioversion N/A 07/04/2012    Procedure: CARDIOVERSION;  Surgeon: Fay Records, MD;  Location: [facility] ENDOSCOPY;  Service: Cardiovascular;  Laterality: N/A;   Family History  Problem Relation Age of Onset  . Heart failure Father   . Heart disease Brother   . Diabetes Father   . Colon cancer Brother   . Lung cancer Sister   . Lymphoma Sister    History  Substance Use Topics  . Smoking status: Never Smoker   . Smokeless tobacco: Never Used  . Alcohol Use: No   OB History   Grav Para Term Preterm Abortions TAB SAB Ect Mult Living                 Review of Systems Ten point review of symptoms performed and is negative with the exception of symptoms noted above.    Allergies  Asa; Codeine; Potassium chloride; and Sulfonamide derivatives  Home Medications   Prior to Admission medications   Medication Sig Start Date End Date Taking? Authorizing Provider  amiodarone (PACERONE) 200 MG tablet Take 400 mg by mouth 2 (two) times daily.   Yes Historical Provider, MD  apixaban (ELIQUIS) 2.5 MG TABS tablet Take 1 tablet (2.5 mg total) by mouth 2 (  two) times daily. 06/15/13  Yes Tarri Fuller, PA-C  Calcium Carbonate-Vitamin D (CALCIUM + D PO) Take 2 tablets by mouth 2 (two) times daily.   Yes Historical Provider, MD  cyanocobalamin 500 MCG tablet Take 500 mcg by mouth daily.   Yes Historical Provider, MD  diltiazem (CARDIZEM) 60 MG tablet Take 1 tablet (60 mg total) by mouth 3 (three) times daily. 06/16/13  Yes Tarri Fuller, PA-C  diphenhydrAMINE (BENADRYL) 25 MG tablet Take 25 mg by mouth once as needed for itching (from mosquito bite).   Yes Historical Provider, MD  furosemide (LASIX) 20 MG tablet Take 10-20 mg by mouth daily.  12/13/12  Yes Darlin Coco, MD  lansoprazole (PREVACID SOLUTAB) 30 MG disintegrating tablet  Take 30 mg by mouth daily.   Yes Historical Provider, MD  Multiple Vitamins-Minerals (EYE VITAMINS PO) Take 1 tablet by mouth daily.   Yes Historical Provider, MD  zolpidem (AMBIEN) 10 MG tablet Take 5 mg by mouth at bedtime.  09/14/11  Yes Historical Provider, MD   BP 156/68  Pulse 99  Temp(Src) 99.1 F (37.3 C) (Oral)  Resp 16  SpO2 100% Physical Exam Gen: well developed and well nourished appearing Head: NCAT Eyes: PERL, EOMI Nose: no epistaixis or rhinorrhea Mouth/throat: mucosa is moist and pink Neck: supple, no stridor Lungs: CTA B, no wheezing, rhonchi .  Bibasilar rales appreciated.  CV: RRR, no murmur, extremities appear well perfused.  Abd: soft, notender, nondistended Back: no ttp, no cva ttp Skin: warm and dry Ext: normal to inspection, no dependent edema Neuro: CN ii-xii grossly intact, no focal deficits Psyche; normal affect,  calm and cooperative.   ED Course  Procedures (including critical care time) Labs Review  Results for orders placed during the hospital encounter of 06/18/13 (from the past 24 hour(s))  CBC WITH DIFFERENTIAL     Status: Abnormal   Collection Time    06/19/13  1:44 AM      Result Value Ref Range   WBC 10.8 (*) 4.0 - 10.5 K/uL   RBC 3.43 (*) 3.87 - 5.11 MIL/uL   Hemoglobin 10.1 (*) 12.0 - 15.0 g/dL   HCT 30.8 (*) 36.0 - 46.0 %   MCV 89.8  78.0 - 100.0 fL   MCH 29.4  26.0 - 34.0 pg   MCHC 32.8  30.0 - 36.0 g/dL   RDW 15.9 (*) 11.5 - 15.5 %   Platelets 188  150 - 400 K/uL   Neutrophils Relative % 86 (*) 43 - 77 %   Neutro Abs 9.3 (*) 1.7 - 7.7 K/uL   Lymphocytes Relative 4 (*) 12 - 46 %   Lymphs Abs 0.4 (*) 0.7 - 4.0 K/uL   Monocytes Relative 10  3 - 12 %   Monocytes Absolute 1.0  0.1 - 1.0 K/uL   Eosinophils Relative 0  0 - 5 %   Eosinophils Absolute 0.0  0.0 - 0.7 K/uL   Basophils Relative 0  0 - 1 %   Basophils Absolute 0.0  0.0 - 0.1 K/uL  COMPREHENSIVE METABOLIC PANEL     Status: Abnormal   Collection Time    06/19/13  1:44  AM      Result Value Ref Range   Sodium 144  137 - 147 mEq/L   Potassium 3.7  3.7 - 5.3 mEq/L   Chloride 103  96 - 112 mEq/L   CO2 28  19 - 32 mEq/L   Glucose, Bld 111 (*) 70 - 99 mg/dL  BUN 21  6 - 23 mg/dL   Creatinine, Ser 0.94  0.50 - 1.10 mg/dL   Calcium 9.0  8.4 - 10.5 mg/dL   Total Protein 6.8  6.0 - 8.3 g/dL   Albumin 4.0  3.5 - 5.2 g/dL   AST 23  0 - 37 U/L   ALT 18  0 - 35 U/L   Alkaline Phosphatase 87  39 - 117 U/L   Total Bilirubin 1.3 (*) 0.3 - 1.2 mg/dL   GFR calc non Af Amer 52 (*) >90 mL/min   GFR calc Af Amer 60 (*) >90 mL/min  I-STAT VENOUS BLOOD GAS, ED     Status: Abnormal   Collection Time    06/19/13  2:50 AM      Result Value Ref Range   pH, Ven 7.414 (*) 7.250 - 7.300   pCO2, Ven 44.8 (*) 45.0 - 50.0 mmHg   pO2, Ven 51.0 (*) 30.0 - 45.0 mmHg   Bicarbonate 28.6 (*) 20.0 - 24.0 mEq/L   TCO2 30  0 - 100 mmol/L   O2 Saturation 85.0     Acid-Base Excess 4.0 (*) 0.0 - 2.0 mmol/L   Patient temperature 99.1 F     Sample type VENOUS    I-STAT CG4 LACTIC ACID, ED     Status: None   Collection Time    06/19/13  2:52 AM      Result Value Ref Range   Lactic Acid, Venous 0.90  0.5 - 2.2 mmol/L  I-STAT TROPOININ, ED     Status: None   Collection Time    06/19/13  2:54 AM      Result Value Ref Range   Troponin i, poc 0.01  0.00 - 0.08 ng/mL   Comment 3             CXR: bibasilar infiltrates, otherwise unremarkable.  EKG: nsr, no acute ischemic changes, normal intervals, normal axis, normal qrs complex  CRITICAL CARE Performed by: Elyn Peers   Total critical care time: 26m  Critical care time was exclusive of separately billable procedures and treating other patients.  Critical care was necessary to treat or prevent imminent or life-threatening deterioration.  Critical care was time spent personally by me on the following activities: development of treatment plan with patient and/or surrogate as well as nursing, discussions with consultants,  evaluation of patient's response to treatment, examination of patient, obtaining history from patient or surrogate, ordering and performing treatments and interventions, ordering and review of laboratory studies, ordering and review of radiographic studies, pulse oximetry and re-evaluation of patient's condition.   MDM   DDX: pneumonia, ptx, pleural effusion, pulmonary edema, pulmonary fibrosis secondary amiodarone, ACS.   Work up is most consistent with pneumonia - leukocytosis and bibasilar infiltrates on CXR.  The patient is hypoxic on RA with sats in the 88% range and increase in RR to 32/min.   We will tx with broad spectrum abx for HCAP. Blood cultures obtained. Case discussed with Dtr and patient and Dr. Hal Hope who will admit to Tele Unit.     Elyn Peers, MD 06/19/13 2947  Elyn Peers, MD 06/19/13 [phone]

## 2013-06-19 NOTE — ED Notes (Signed)
The pt has had a headache chills and a temp since this am.  No chest pain she arrived by [facility] from home.  She just left the hospital here Thursday and she is scheduled  For an ablation .

## 2013-06-19 NOTE — ED Notes (Signed)
The pt s family are very angry because a doctor has not seen this pt.  The daughter  States that she should have taken the pt tom [place].  That she cannot believe that they have not seen a doctor yet

## 2013-06-19 NOTE — H&P (Signed)
[facility] History and Physical  Anna Elliott HBZ:[id number] DOB: 1921/02/09 DOA: 06/18/2013  Referring physician: ER physician. PCP: Sherrie Mustache, MD   Chief Complaint: Shortness of breath.  HPI: Anna Elliott is a 80 y.o. female with history of atrial fibrillation was recently admitted for A. fib with RVR and as per patient's daughter patient was originally scheduled for cardioversion later tomorrow was brought to the ER patient has been having shortness of breath with throat congestion wheezing nonproductive cough over the last 2 days. Patient has been feeling weak and subjective feeling of fever and chills. In the ER chest x-ray was showing possible pneumonia and patient was mildly febrile. Patient otherwise denies any nausea vomiting abdominal pain diarrhea or chest pain. Patient has been admitted for health care associated pneumonia. Patient was recently placed on amiodarone last week. Since patient's symptoms started patient's family had called cardiologist and was told to discontinue amiodarone. Patient took extra dose of Lasix suspecting her symptom may be from CHF.  Review of Systems: As presented in the history of presenting illness, rest negative.  Past Medical History  Diagnosis Date  . Hypertension   . Moderate aortic stenosis     a. Dx 2011. b. Echo 05/2012: EF 65%, mild LVH, mod AS mg 74mmHg, mildly dilated LA.  Marland Kitchen Chronic anemia     a. dating back to 2002 - BM bx: early mild dysplasia and/or sideroblastic changes.  Marland Kitchen Dysphagia   . GERD (gastroesophageal reflux disease)   . Esophagitis   . Hiatal hernia   . H/O: rheumatic fever     a. as a child  . Atrial fibrillation     a. Dx 05/2012 unknown duration, started on Eliqiuis.  . Chronic diastolic heart failure   . Tubular adenoma of colon 11/2008   Past Surgical History  Procedure Laterality Date  . Tonsillectomy    . Cesarean section  1962  . Abdominal hysterectomy  1967  . Cataract extraction w/  intraocular lens  implant, bilateral Bilateral 1990's  . Esophageal dilation      "twice in her lifetime, I think; most recent was early 2000's" (06/22/2012)  . Tee without cardioversion N/A 07/04/2012    Procedure: TRANSESOPHAGEAL ECHOCARDIOGRAM (TEE);  Surgeon: Fay Records, MD;  Location: [place];  Service: Cardiovascular;  Laterality: N/A;  talk to [facility]  . Cardioversion N/A 07/04/2012    Procedure: CARDIOVERSION;  Surgeon: Fay Records, MD;  Location: [facility] ENDOSCOPY;  Service: Cardiovascular;  Laterality: N/A;   Social History:  reports that she has never smoked. She has never used smokeless tobacco. She reports that she does not drink alcohol or use illicit drugs. Where does patient live home. Can patient participate in ADLs? Yes.  Allergies  Allergen Reactions  . Asa [Aspirin] Other (See Comments)    Acid reflux  . Codeine Other (See Comments)    Makes her sick  . Potassium Chloride     Loss of appetite  . Sulfonamide Derivatives Itching and Rash    Family History:  Family History  Problem Relation Age of Onset  . Heart failure Father   . Heart disease Brother   . Diabetes Father   . Colon cancer Brother   . Lung cancer Sister   . Lymphoma Sister       Prior to Admission medications   Medication Sig Start Date End Date Taking? Authorizing Provider  amiodarone (PACERONE) 200 MG tablet Take 400 mg by mouth 2 (two) times daily.   Yes Historical  Provider, MD  apixaban (ELIQUIS) 2.5 MG TABS tablet Take 1 tablet (2.5 mg total) by mouth 2 (two) times daily. 06/15/13  Yes Tarri Fuller, PA-C  Calcium Carbonate-Vitamin D (CALCIUM + D PO) Take 2 tablets by mouth 2 (two) times daily.   Yes Historical Provider, MD  cyanocobalamin 500 MCG tablet Take 500 mcg by mouth daily.   Yes Historical Provider, MD  diltiazem (CARDIZEM) 60 MG tablet Take 1 tablet (60 mg total) by mouth 3 (three) times daily. 06/16/13  Yes Tarri Fuller, PA-C  diphenhydrAMINE (BENADRYL) 25 MG tablet Take 25 mg by mouth  once as needed for itching (from mosquito bite).   Yes Historical Provider, MD  furosemide (LASIX) 20 MG tablet Take 10-20 mg by mouth daily.  12/13/12  Yes Darlin Coco, MD  lansoprazole (PREVACID SOLUTAB) 30 MG disintegrating tablet Take 30 mg by mouth daily.   Yes Historical Provider, MD  Multiple Vitamins-Minerals (EYE VITAMINS PO) Take 1 tablet by mouth daily.   Yes Historical Provider, MD  zolpidem (AMBIEN) 10 MG tablet Take 5 mg by mouth at bedtime.  09/14/11  Yes Historical Provider, MD    Physical Exam: Filed Vitals:   06/19/13 0400 06/19/13 0412 06/19/13 0441 06/19/13 0530  BP: 109/56  125/52 147/58  Pulse: 103  95 99  Temp:  98.5 F (36.9 C)  99.8 F (37.7 C)  TempSrc:  Oral  Oral  Resp: 21  13 17   Height:    5\' 5"  (1.651 m)  Weight:    57.425 kg (126 lb 9.6 oz)  SpO2: 97%  99% 99%     General:  Well-developed and nourished.  Eyes: Anicteric no pallor.  ENT: No discharge from the ears eyes nose mouth.  Neck: No mass felt.  Cardiovascular: S1-S2 heard.  Respiratory:  No rhonchi or crepitations.  Abdomen: Soft nontender bowel sounds present. No guarding rigidity.  Skin: No rash.  Musculoskeletal: No edema.  Psychiatric: Appears normal.  Neurologic: Alert awake oriented to time place and person. Moves all extremities.  Labs on Admission:  Basic Metabolic Panel:  Recent Labs Lab 06/13/13 1125 06/13/13 1547 06/14/13 0353 06/15/13 0516 06/19/13 0144  NA 143  --  141 144 144  K 4.1  --  4.0 4.1 3.7  CL 105  --  103 104 103  CO2 26  --  27 27 28   GLUCOSE 133*  --  109* 103* 111*  BUN 21  --  24* 25* 21  CREATININE 0.89  --  0.93 1.11* 0.94  CALCIUM 9.0  --  8.8 8.8 9.0  MG  --  2.2  --   --   --    Liver Function Tests:  Recent Labs Lab 06/15/13 0516 06/19/13 0144  AST 11 23  ALT 6 18  ALKPHOS 58 87  BILITOT 0.8 1.3*  PROT 5.6* 6.8  ALBUMIN 3.4* 4.0   No results found for this basename: LIPASE, AMYLASE,  in the last 168 hours No  results found for this basename: AMMONIA,  in the last 168 hours CBC:  Recent Labs Lab 06/13/13 1125 06/14/13 0353 06/19/13 0144  WBC 6.1 5.1 10.8*  NEUTROABS  --   --  9.3*  HGB 10.5* 10.4* 10.1*  HCT 32.2* 31.8* 30.8*  MCV 89.2 89.8 89.8  PLT 198 164 188   Cardiac Enzymes:  Recent Labs Lab 06/13/13 1547 06/13/13 2122 06/14/13 0353  TROPONINI <0.30 <0.30 <0.30    BNP (last 3 results)  Recent Labs  07/03/12  1200 06/13/13 1125 06/19/13 0220  PROBNP 3694.0* 4974.0* 2094.0*   CBG: No results found for this basename: GLUCAP,  in the last 168 hours  Radiological Exams on Admission: Dg Chest Port 1 View  06/19/2013   CLINICAL DATA:  Shortness of breath.  EXAM: PORTABLE CHEST - 1 VIEW  COMPARISON:  Chest radiograph performed 06/13/2013  FINDINGS: The lungs are well-aerated. Mild bibasilar airspace opacities may reflect atelectasis or mild pneumonia. Mild peribronchial thickening is again seen. There is no evidence of pleural effusion or pneumothorax.  The cardiomediastinal silhouette is borderline enlarged. No acute osseous abnormalities are seen.  IMPRESSION: Mild bibasilar airspace opacities may reflect atelectasis or mild pneumonia. Mild peribronchial thickening seen. Borderline cardiomegaly.   Electronically Signed   By: Garald Balding M.D.   On: 06/19/2013 03:16     Assessment/Plan Principal Problem:   Pneumonia Active Problems:   HYPERTENSION   Moderate aortic stenosis   Atrial fibrillation   1. Pneumonia - we'll be treating this as health care associated pneumonia with vancomycin and cefepime as patient was recently discharged from hospital. Follow blood cultures. 2. Atrial fibrillation presently rate controlled - continue home medications and anticoagulants. Patient was originally scheduled for cardioversion tomorrow by cardiologist. May inform cardiology. 3. CHF - last EF measured was last week 65-70% - continue Lasix. 4. Chronic anemia - closely follow  CBC. 5. Hypertension - continue present medications. 6. Moderate aortic stenosis.    Code Status: Full code.  Family Communication: Patient started.  Disposition Plan: Admit to inpatient.    [facility] Pager [phone].  If 7PM-7AM, please contact night-coverage [email or web address] Password [facility] 06/19/2013, 6:55 AM

## 2013-06-19 NOTE — Consult Note (Signed)
CARDIOLOGY CONSULT NOTE   Patient ID: Anna Elliott MRN: [id number] DOB/AGE: 04-18-1921 80 y.o.  Admit date: 06/18/2013  Primary Physician   Sherrie Mustache, MD Primary Cardiologist   Dr. Mare Ferrari Reason for Consultation  paroxysmal atrial fibrillation, need for TEE cardioversion  Anna Elliott is a 80 y.o. female with a history of PAF on Eliquis.  She was admitted to the hospital from 5/19 through 5/22 with PAF/RVR. At that time, through a misunderstanding, her Eliquis dosage was subtherapeutic. She had been on metoprolol and amlodipine but her heart rate was elevated. The Lopressor and amlodipine were discontinued. She was started on diltiazem, short acting due to her pharmacy not having the Cardizem CD. She was discharged on 05/22 with the plan for a TEE cardioversion on 05/26.  She became more short of breath since discharge and developed a cough. The cough is nonproductive. She developed a fever and no shortness of breath worsened, so she came to the emergency room. She was admitted for pneumonia and started on antibiotics. She is also on oxygen. Her oxygen saturation was as low as 85% overnight, but is currently improved. She feels that her shortness of breath is improved. She has not been aware of any atrial fibrillation and denies any palpitations, presyncope or syncope. She is weak. She has not had any chest pain.  Past Medical History  Diagnosis Date  . Hypertension   . Moderate aortic stenosis     a. Dx 2011. b. Echo 05/2012: EF 65%, mild LVH, mod AS mg 53mmHg, mildly dilated LA.  Marland Kitchen Chronic anemia     a. dating back to 2002 - BM bx: early mild dysplasia and/or sideroblastic changes.  Marland Kitchen Dysphagia   . GERD (gastroesophageal reflux disease)   . Esophagitis   . Hiatal hernia   . H/O: rheumatic fever     a. as a child  . Atrial fibrillation     a. Dx 05/2012 unknown duration, started on Eliqiuis.  . Chronic diastolic heart failure   . Tubular adenoma of colon  11/2008     Past Surgical History  Procedure Laterality Date  . Tonsillectomy    . Cesarean section  1962  . Abdominal hysterectomy  1967  . Cataract extraction w/ intraocular lens  implant, bilateral Bilateral 1990's  . Esophageal dilation      "twice in her lifetime, I think; most recent was early 2000's" (06/22/2012)  . Tee without cardioversion N/A 07/04/2012    Procedure: TRANSESOPHAGEAL ECHOCARDIOGRAM (TEE);  Surgeon: Fay Records, MD;  Location: [facility];  Service: Cardiovascular;  Laterality: N/A;  talk to [facility]  . Cardioversion N/A 07/04/2012    Procedure: CARDIOVERSION;  Surgeon: Fay Records, MD;  Location: [facility] ENDOSCOPY;  Service: Cardiovascular;  Laterality: N/A;    Allergies  Allergen Reactions  . Asa [Aspirin] Other (See Comments)    Acid reflux  . Codeine Other (See Comments)    Makes her sick  . Potassium Chloride     Loss of appetite  . Sulfonamide Derivatives Itching and Rash    I have reviewed the patient's current medications . apixaban  2.5 mg Oral BID  . budesonide (PULMICORT) nebulizer solution  0.25 mg Nebulization BID  . ceFEPime (MAXIPIME) IV  1 g Intravenous Q24H  . cyanocobalamin  500 mcg Oral Daily  . diltiazem  60 mg Oral TID  . [START ON 06/20/2013] furosemide  20 mg Oral Daily  . levalbuterol      . pantoprazole  40 mg Oral Daily  . sodium chloride  3 mL Intravenous Q12H  . sodium chloride  3 mL Intravenous Q12H  . [START ON 06/20/2013] vancomycin  750 mg Intravenous Q24H  . zolpidem  5 mg Oral QHS     acetaminophen, acetaminophen, acetaminophen, levalbuterol, ondansetron (ZOFRAN) IV, ondansetron  Medication Sig  amiodarone (PACERONE) 200 MG tablet Take 400 mg by mouth 2 (two) times daily.  apixaban (ELIQUIS) 2.5 MG TABS tablet Take 1 tablet (2.5 mg total) by mouth 2 (two) times daily.  Calcium Carbonate-Vitamin D (CALCIUM + D PO) Take 2 tablets by mouth 2 (two) times daily.  cyanocobalamin 500 MCG tablet Take 500 mcg by mouth daily.   diltiazem (CARDIZEM) 60 MG tablet Take 1 tablet (60 mg total) by mouth 3 (three) times daily.  diphenhydrAMINE (BENADRYL) 25 MG tablet Take 25 mg by mouth once as needed for itching (from mosquito bite).  furosemide (LASIX) 20 MG tablet Take 10 mg by mouth daily. Pt states started on 20mg  daily & MD had recently changed to 10mg  daily (took 20mg  on 5/24)  lansoprazole (PREVACID SOLUTAB) 30 MG disintegrating tablet Take 30 mg by mouth daily.  Multiple Vitamins-Minerals (EYE VITAMINS PO) Take 1 tablet by mouth daily.  zolpidem (AMBIEN) 10 MG tablet Take 5 mg by mouth at bedtime.      History   Social History  . Marital Status: Widowed    Spouse Name: N/A    Number of Children: N/A  . Years of Education: N/A   Occupational History  . Retired    Social History Main Topics  . Smoking status: Never Smoker   . Smokeless tobacco: Never Used  . Alcohol Use: No  . Drug Use: No  . Sexual Activity: Not on file   Other Topics Concern  . Not on file   Social History Narrative   Lives in [place] with her dtr and son-in-law.  She is active around the house and occasionally goes for walks at the mall.  No h/o falls though she notes that her macular degeneration limits her vision somewhat and she is prone to losing her balance on inclines.    No family status information on file.   Family History  Problem Relation Age of Onset  . Heart failure Father   . Heart disease Brother   . Diabetes Father   . Colon cancer Brother   . Lung cancer Sister   . Lymphoma Sister      ROS:  Full 14 point review of systems complete and found to be negative unless listed above.  Physical Exam: Blood pressure 145/53, pulse 83, temperature 98.2 F (36.8 C), temperature source Oral, resp. rate 20, height 5\' 5"  (1.651 m), weight 126 lb 9.6 oz (57.425 kg), SpO2 98.00%.  General: Well developed, well nourished, slender elderly female in no acute distress Head: Eyes PERRLA, No xanthomas.   Normocephalic and  atraumatic, oropharynx without edema or exudate. Dentition: poor Lungs:  Heart: HRRR S1 S2, no rub/gallop, 3/6 murmur. pulses are 2+ both upper extrem, slightly decreased in both lower extremities.   Neck: No carotid bruits. No lymphadenopathy.  JVD at 8 cm. Abdomen: Bowel sounds present, abdomen soft and non-tender without masses or hernias noted. Msk:  No spine or cva tenderness, generalized weakness, no joint deformities or effusions. Extremities: No clubbing or cyanosis. No edema.  Neuro: Alert and oriented X 3. No focal deficits noted. Psych:  Good affect, responds appropriately Skin: No rashes or lesions noted.  Labs:  Lab Results  Component Value Date   WBC 10.7* 06/19/2013   HGB 8.8* 06/19/2013   HCT 26.1* 06/19/2013   MCV 88.5 06/19/2013   PLT 160 06/19/2013     Recent Labs Lab 06/19/13 0805  NA 140  K 3.7  CL 101  CO2 26  BUN 20  CREATININE 0.89  CALCIUM 8.4  PROT 5.6*  BILITOT 1.9*  ALKPHOS 70  ALT 15  AST 15  GLUCOSE 133*  ALBUMIN 3.2*   Magnesium  Date Value Ref Range Status  06/13/2013 2.2  1.5 - 2.5 mg/dL Final    Recent Labs  06/19/13 0254  TROPIPOC 0.01   Pro B Natriuretic peptide (BNP)  Date/Time Value Ref Range Status  06/19/2013  2:20 AM 2094.0* 0 - 450 pg/mL Final  06/13/2013 11:25 AM 4974.0* 0 - 450 pg/mL Final   TSH  Date/Time Value Ref Range Status  06/13/2013  2:06 PM 2.040  0.350 - 4.500 uIU/mL Final     Please note change in reference range.     Echo: 06/14/2013 Study Conclusions - Left ventricle: The cavity size was normal. There was mild focal basal hypertrophy of the septum. Systolic function was vigorous. The estimated ejection fraction was in the range of 65% to 70%. Wall motion was normal; there were no regional wall motion abnormalities. The study is not technically sufficient to allow evaluation of LV diastolic function. - Aortic valve: There was moderate stenosis. Valve area (VTI): 0.71 cm^2. Valve area (Vmax): 0.78  cm^2. Mean gradient (S): 27 mm Hg.  Peak gradient (S): 42 mm Hg - Mitral valve: Moderately to severely calcified annulus. Moderately thickened, mildly calcified leaflets . Mild, late systolicprolapse, involving the posterior leaflet. There was mild regurgitation. Valve area by continuity equation (using LVOT flow): 2.0 cm^2. - Left atrium: The atrium was moderately to severely dilated. - Tricuspid valve: There was moderate regurgitation. - Pulmonary arteries: PA peak pressure: 43 mm Hg (S).   ECG:  19-Jun-2013 04:08:08  Sinus tachycardia Probable left ventricular hypertrophy Prolonged QT interval Vent. rate 103 BPM PR interval 162 ms QRS duration 95 ms QT/QTc 385/504 ms P-R-T axes 92 39 65  Telemetry: Reviewed and shows sinus rhythm, sinus tachycardia with no significant ectopy and no atrial fibrillation.  Radiology:  Dg Chest Port 1 View 06/19/2013   CLINICAL DATA:  Shortness of breath.  EXAM: PORTABLE CHEST - 1 VIEW  COMPARISON:  Chest radiograph performed 06/13/2013  FINDINGS: The lungs are well-aerated. Mild bibasilar airspace opacities may reflect atelectasis or mild pneumonia. Mild peribronchial thickening is again seen. There is no evidence of pleural effusion or pneumothorax.  The cardiomediastinal silhouette is borderline enlarged. No acute osseous abnormalities are seen.  IMPRESSION: Mild bibasilar airspace opacities may reflect atelectasis or mild pneumonia. Mild peribronchial thickening seen. Borderline cardiomegaly.   Electronically Signed   By: Garald Balding M.D.   On: 06/19/2013 03:16    ASSESSMENT AND PLAN:   The patient was seen today by Dr. Bronson Ing, the patient evaluated and the data reviewed.  Principal Problem:   Pneumonia - per IM, the patient states she feels better. Consider followup two-view chest x-ray once she improves a little more.  Active Problems:   HYPERTENSION - currently on discharge medications, systolic blood pressure 119J-478G. Per IM     Moderate aortic stenosis - murmur noted on exam, recent echo performed with results above    Atrial fibrillation - patient was in atrial fibrillation at discharge, currently maintaining sinus rhythm. We'll cancel TEE  cardioversion for tomorrow continue current medications. With acute illness, would continue patient on telemetry. Consider adding amiodarone.   Signed: Lonn Georgia, PA-C 06/19/2013 4:39 PM Beeper [phone]  Co-Sign MD

## 2013-06-19 NOTE — ED Notes (Signed)
Transporting patient to new room assignment. 

## 2013-06-19 NOTE — ED Provider Notes (Signed)
CSN: [id number]     Arrival date & time 06/18/13  2357 History   First MD Initiated Contact with Patient 06/19/13 0200     Chief Complaint  Patient presents with  . flu-like symptoms      (Consider location/radiation/quality/duration/timing/severity/associated sxs/prior Treatment) HPI  Past Medical History  Diagnosis Date  . Hypertension   . Moderate aortic stenosis     a. Dx 2011. b. Echo 05/2012: EF 65%, mild LVH, mod AS mg 73mmHg, mildly dilated LA.  Marland Kitchen Chronic anemia     a. dating back to 2002 - BM bx: early mild dysplasia and/or sideroblastic changes.  Marland Kitchen Dysphagia   . GERD (gastroesophageal reflux disease)   . Esophagitis   . Hiatal hernia   . H/O: rheumatic fever     a. as a child  . Atrial fibrillation     a. Dx 05/2012 unknown duration, started on Eliqiuis.  . Chronic diastolic heart failure   . Tubular adenoma of colon 11/2008   Past Surgical History  Procedure Laterality Date  . Tonsillectomy    . Cesarean section  1962  . Abdominal hysterectomy  1967  . Cataract extraction w/ intraocular lens  implant, bilateral Bilateral 1990's  . Esophageal dilation      "twice in her lifetime, I think; most recent was early 2000's" (06/22/2012)  . Tee without cardioversion N/A 07/04/2012    Procedure: TRANSESOPHAGEAL ECHOCARDIOGRAM (TEE);  Surgeon: Fay Records, MD;  Location: [place];  Service: Cardiovascular;  Laterality: N/A;  talk to [facility]  . Cardioversion N/A 07/04/2012    Procedure: CARDIOVERSION;  Surgeon: Fay Records, MD;  Location: [facility] ENDOSCOPY;  Service: Cardiovascular;  Laterality: N/A;   Family History  Problem Relation Age of Onset  . Heart failure Father   . Heart disease Brother   . Diabetes Father   . Colon cancer Brother   . Lung cancer Sister   . Lymphoma Sister    History  Substance Use Topics  . Smoking status: Never Smoker   . Smokeless tobacco: Never Used  . Alcohol Use: No   OB History   Grav Para Term Preterm Abortions TAB SAB Ect Mult  Living                 Review of Systems    Allergies  Asa; Codeine; Potassium chloride; and Sulfonamide derivatives  Home Medications   Prior to Admission medications   Medication Sig Start Date End Date Taking? Authorizing Provider  amiodarone (PACERONE) 200 MG tablet Take 400 mg by mouth 2 (two) times daily.   Yes Historical Provider, MD  apixaban (ELIQUIS) 2.5 MG TABS tablet Take 1 tablet (2.5 mg total) by mouth 2 (two) times daily. 06/15/13  Yes Tarri Fuller, PA-C  Calcium Carbonate-Vitamin D (CALCIUM + D PO) Take 2 tablets by mouth 2 (two) times daily.   Yes Historical Provider, MD  cyanocobalamin 500 MCG tablet Take 500 mcg by mouth daily.   Yes Historical Provider, MD  diltiazem (CARDIZEM) 60 MG tablet Take 1 tablet (60 mg total) by mouth 3 (three) times daily. 06/16/13  Yes Tarri Fuller, PA-C  diphenhydrAMINE (BENADRYL) 25 MG tablet Take 25 mg by mouth once as needed for itching (from mosquito bite).   Yes Historical Provider, MD  furosemide (LASIX) 20 MG tablet Take 10-20 mg by mouth daily.  12/13/12  Yes Darlin Coco, MD  lansoprazole (PREVACID SOLUTAB) 30 MG disintegrating tablet Take 30 mg by mouth daily.   Yes Historical Provider, MD  Multiple Vitamins-Minerals (EYE VITAMINS PO) Take 1 tablet by mouth daily.   Yes Historical Provider, MD  zolpidem (AMBIEN) 10 MG tablet Take 5 mg by mouth at bedtime.  09/14/11  Yes Historical Provider, MD   BP 109/56  Pulse 103  Temp(Src) 98.5 F (36.9 C) (Oral)  Resp 21  SpO2 97% Physical Exam  ED Course  Procedures (including critical care time) Labs Review Labs Reviewed  CBC WITH DIFFERENTIAL - Abnormal; Notable for the following:    WBC 10.8 (*)    RBC 3.43 (*)    Hemoglobin 10.1 (*)    HCT 30.8 (*)    RDW 15.9 (*)    Neutrophils Relative % 86 (*)    Neutro Abs 9.3 (*)    Lymphocytes Relative 4 (*)    Lymphs Abs 0.4 (*)    All other components within normal limits  COMPREHENSIVE METABOLIC PANEL - Abnormal; Notable for  the following:    Glucose, Bld 111 (*)    Total Bilirubin 1.3 (*)    GFR calc non Af Amer 52 (*)    GFR calc Af Amer 60 (*)    All other components within normal limits  PRO B NATRIURETIC PEPTIDE - Abnormal; Notable for the following:    Pro B Natriuretic peptide (BNP) 2094.0 (*)    All other components within normal limits  I-STAT VENOUS BLOOD GAS, ED - Abnormal; Notable for the following:    pH, Ven 7.414 (*)    pCO2, Ven 44.8 (*)    pO2, Ven 51.0 (*)    Bicarbonate 28.6 (*)    Acid-Base Excess 4.0 (*)    All other components within normal limits  CULTURE, BLOOD (ROUTINE X 2)  CULTURE, BLOOD (ROUTINE X 2)  URINALYSIS, ROUTINE W REFLEX MICROSCOPIC  BLOOD GAS, VENOUS  I-STAT TROPOININ, ED  I-STAT CG4 LACTIC ACID, ED    Imaging Review Dg Chest Port 1 View  06/19/2013   CLINICAL DATA:  Shortness of breath.  EXAM: PORTABLE CHEST - 1 VIEW  COMPARISON:  Chest radiograph performed 06/13/2013  FINDINGS: The lungs are well-aerated. Mild bibasilar airspace opacities may reflect atelectasis or mild pneumonia. Mild peribronchial thickening is again seen. There is no evidence of pleural effusion or pneumothorax.  The cardiomediastinal silhouette is borderline enlarged. No acute osseous abnormalities are seen.  IMPRESSION: Mild bibasilar airspace opacities may reflect atelectasis or mild pneumonia. Mild peribronchial thickening seen. Borderline cardiomegaly.   Electronically Signed   By: Garald Balding M.D.   On: 06/19/2013 03:16    EKG: NSR, rate 103 bpm, normal axis, new ST depression present in the inferolateral leads, no STEMI  MDM   Final diagnoses:  Healthcare-associated pneumonia  Respiratory distress  EKG changes    Elyn Peers, MD 06/19/13 [phone]

## 2013-06-19 NOTE — ED Notes (Signed)
The pt reports that she has felt terrible today.  The daughter at the bedside reports that she feels like the pt is in respiratory distress although she is not having respiratory difficulty at present.  The pt is alert her sats are good

## 2013-06-20 ENCOUNTER — Ambulatory Visit (HOSPITAL_COMMUNITY): Admission: RE | Admit: 2013-06-20 | Payer: Medicare Other | Source: Ambulatory Visit | Admitting: Cardiology

## 2013-06-20 ENCOUNTER — Encounter (HOSPITAL_COMMUNITY)
Admission: EM | Disposition: A | Discharge status: Home Health | Payer: Self-pay | Source: Home / Self Care | Attending: Internal Medicine

## 2013-06-20 DIAGNOSIS — D539 Nutritional anemia, unspecified: Secondary | ICD-10-CM

## 2013-06-20 DIAGNOSIS — I5032 Chronic diastolic (congestive) heart failure: Secondary | ICD-10-CM

## 2013-06-20 DIAGNOSIS — D649 Anemia, unspecified: Secondary | ICD-10-CM

## 2013-06-20 DIAGNOSIS — K219 Gastro-esophageal reflux disease without esophagitis: Secondary | ICD-10-CM

## 2013-06-20 DIAGNOSIS — I10 Essential (primary) hypertension: Secondary | ICD-10-CM

## 2013-06-20 LAB — BASIC METABOLIC PANEL
BUN: 20 mg/dL (ref 6–23)
CHLORIDE: 99 meq/L (ref 96–112)
CO2: 28 meq/L (ref 19–32)
Calcium: 8.7 mg/dL (ref 8.4–10.5)
Creatinine, Ser: 0.91 mg/dL (ref 0.50–1.10)
GFR calc Af Amer: 62 mL/min — ABNORMAL LOW (ref >90)
GFR calc non Af Amer: 54 mL/min — ABNORMAL LOW (ref >90)
Glucose, Bld: 113 mg/dL — ABNORMAL HIGH (ref 70–99)
Potassium: 3.8 mEq/L (ref 3.7–5.3)
Sodium: 136 mEq/L — ABNORMAL LOW (ref 137–147)

## 2013-06-20 LAB — CBC
HEMATOCRIT: 24.1 % — AB (ref 36.0–46.0)
Hemoglobin: 8 g/dL — ABNORMAL LOW (ref 12.0–15.0)
MCH: 29.7 pg (ref 26.0–34.0)
MCHC: 33.2 g/dL (ref 30.0–36.0)
MCV: 89.6 fL (ref 78.0–100.0)
Platelets: 147 10*3/uL — ABNORMAL LOW (ref 150–400)
RBC: 2.69 MIL/uL — ABNORMAL LOW (ref 3.87–5.11)
RDW: 16.2 % — AB (ref 11.5–15.5)
WBC: 8.1 10*3/uL (ref 4.0–10.5)

## 2013-06-20 SURGERY — ECHOCARDIOGRAM, TRANSESOPHAGEAL
Anesthesia: Moderate Sedation

## 2013-06-20 MED ORDER — ZOLPIDEM TARTRATE 5 MG PO TABS
2.5000 mg | ORAL_TABLET | Freq: Every day | ORAL | Status: DC
  Administered 2013-06-20 – 2013-06-22 (×3): 2.5 mg via ORAL
  Filled 2013-06-20 (×3): qty 1

## 2013-06-20 NOTE — Care Management Note (Addendum)
  Page 2 of 2   06/23/2013     10:37:24 AM CARE MANAGEMENT NOTE 06/23/2013  Patient:  Anna Elliott,Anna Elliott   Account Number:  [id number]  Date Initiated:  06/20/2013  Documentation initiated by:  Alister Staver  Subjective/Objective Assessment:   Admitted with pneumonia     Action/Plan:   CM to follow for dispositon needs   Anticipated DC Date:  06/22/2013   Anticipated DC Plan:  [place]  CM consult      [place]   Choice offered to / List presented to:  C-1 Patient        [place] arranged  [place] RN      [place].   Status of service:  Completed, signed off Medicare Important Message given?  YES (If response is "NO", the following Medicare IM given date fields will be blank) Date Medicare IM given:  06/20/2013 Date Additional Medicare IM given:  06/23/2013  Discharge Disposition:  [place]  Per UR Regulation:  Reviewed for med. necessity/level of care/duration of stay  If discussed at [place] of Stay Meetings, dates discussed:    Comments:  Ece Cumberland RN, BSN, MSHL, CCM  Nurse - Case Manager, (Unit [place])  [phone]  06/23/2013 IM copy to patient HHS:  RN, PT ([facility]na notified) Disease Mgmt Encouraged use of home insentive spirometer Medication Reconciliation post hospital d/c. (DTR manages medications) but concerned about several med changes during admission and d/c planning) DME Recommendations:  Kasandra Knudsen - DTR to purchase Other IDT: PCP:  Dr. Sherrie Mustache Phone: [phone]; Fax: [phone] Cardiologist:  Darlin Coco, [place]:   Mariann Laster RN, BSN, [facility], CCM  Nurse - Case Manager, (Unit [facility])  [phone]  06/22/2013 Social:  From home, [place] with her dtr and son-in-law. Independent and active. On Eliquis prior to this admission. PT RECS: Home health PT;Supervision/Assistance - 24  hour DMe RECS: Cane (SPC, NOT quad cane) PCP:  Dr. Daphene Jaeger [place] Intensity of Service:  HBg 7.6 today; IV Lasix x 1 dose Disposition Plan:   HHS:  Pending _________    Mariann Laster RN, BSN, MSHL, CCM  Nurse - Case Manager, (Unit [phone]  06/15/2013 Social:  From home, [place] with her dtr and son-in-law. Independent and active. On Eliquis prior to this admission. Disposition Plan:  pending.

## 2013-06-20 NOTE — Progress Notes (Signed)
Subjective: Feeling better  Objective: Vital signs in last 24 hours: Temp:  [98.2 F (36.8 C)-99.6 F (37.6 C)] 99.6 F (37.6 C) (05/26 0605) Pulse Rate:  [76-84] 84 (05/26 0605) Resp:  [18-20] 18 (05/26 0605) BP: (123-145)/(45-56) 135/56 mmHg (05/26 0605) SpO2:  [93 %-98 %] 97 % (05/26 0605) Last BM Date: 06/19/13  Intake/Output from previous day: 05/25 0701 - 05/26 0700 In: 1280 [P.O.:1080; IV Piggyback:200] Out: 1500 [Urine:1500] Intake/Output this shift: Total I/O In: -  Out: 200 [Urine:200]  Medications Current Facility-Administered Medications  Medication Dose Route Frequency Provider Last Rate Last Dose  . acetaminophen (TYLENOL) tablet 650 mg  650 mg Oral Q6H PRN Rise Patience, MD   650 mg at 06/19/13 2002   Or  . acetaminophen (TYLENOL) suppository 650 mg  650 mg Rectal Q6H PRN Rise Patience, MD      . acetaminophen (TYLENOL) tablet 650 mg  650 mg Oral Q6H PRN Rise Patience, MD   650 mg at 06/19/13 5427  . apixaban (ELIQUIS) tablet 2.5 mg  2.5 mg Oral BID Rise Patience, MD   2.5 mg at 06/19/13 2127  . budesonide (PULMICORT) nebulizer solution 0.25 mg  0.25 mg Nebulization BID Barton Dubois, MD   0.25 mg at 06/19/13 1938  . ceFEPIme (MAXIPIME) 1 g in dextrose 5 % 50 mL IVPB  1 g Intravenous Q24H Tomasita Morrow, RPH   1 g at 06/19/13 2002  . cyanocobalamin tablet 500 mcg  500 mcg Oral Daily Rise Patience, MD   500 mcg at 06/19/13 1030  . diltiazem (CARDIZEM) tablet 60 mg  60 mg Oral TID Rise Patience, MD   60 mg at 06/19/13 2127  . furosemide (LASIX) tablet 20 mg  20 mg Oral Daily Barton Dubois, MD   10 mg at 06/19/13 1641  . levalbuterol (XOPENEX) nebulizer solution 0.63 mg  0.63 mg Nebulization Q6H PRN Barton Dubois, MD   0.63 mg at 06/19/13 1557  . ondansetron (ZOFRAN) tablet 4 mg  4 mg Oral Q6H PRN Rise Patience, MD       Or  . ondansetron [facility]) injection 4 mg  4 mg Intravenous Q6H PRN Rise Patience, MD       . pantoprazole (PROTONIX) EC tablet 40 mg  40 mg Oral Daily Rise Patience, MD   40 mg at 06/19/13 1030  . sodium chloride 0.9 % injection 3 mL  3 mL Intravenous Q12H Rise Patience, MD   3 mL at 06/19/13 2127  . sodium chloride 0.9 % injection 3 mL  3 mL Intravenous Q12H Rise Patience, MD   3 mL at 06/19/13 1034  . vancomycin (VANCOCIN) IVPB 750 mg/150 ml premix  750 mg Intravenous Q24H Tomasita Morrow, RPH   750 mg at 06/20/13 0534  . zolpidem (AMBIEN) tablet 5 mg  5 mg Oral QHS Rise Patience, MD   5 mg at 06/19/13 2128    PE: General appearance: alert, cooperative and no distress Lungs: mild coarse basilar crackles Heart: regular rate and rhythm and 2/6 sys MM Extremities: No LEE Pulses: 2+ and symmetric Skin: Warm and dry Neurologic: Grossly normal  Lab Results:   Recent Labs  06/19/13 0855 06/19/13 1543 06/20/13 0520  WBC 9.3 10.7* 8.1  HGB 8.3* 8.8* 8.0*  HCT 25.1* 26.1* 24.1*  PLT 144* 160 147*   BMET  Recent Labs  06/19/13 0144 06/19/13 0805 06/20/13 0520  NA 144 140  136*  K 3.7 3.7 3.8  CL 103 101 99  CO2 28 26 28   GLUCOSE 111* 133* 113*  BUN 21 20 20   CREATININE 0.94 0.89 0.91  CALCIUM 9.0 8.4 8.7    Assessment/Plan  80 y.o. female with a history of PAF on Eliquis. She was admitted to the hospital from 5/19 through 5/22 with PAF/RVR. At that time, through a misunderstanding, her Eliquis dosage was subtherapeutic. She had been on metoprolol and amlodipine but her heart rate was elevated. The Lopressor and amlodipine were discontinued. She was started on diltiazem, short acting due to her pharmacy not having the Cardizem CD. She was discharged on 05/22 with the plan for a TEE cardioversion on 05/26.   She became more short of breath since discharge and developed a cough. The cough is nonproductive. She developed a fever and no shortness of breath worsened, so she came to the emergency room. She was admitted for pneumonia and started  on antibiotics. She is also on oxygen. Her oxygen saturation was as low as 85% overnight, but is currently improved. She feels that her shortness of breath is improved. She has not been aware of any atrial fibrillation and denies any palpitations, presyncope or syncope. She is weak. She has not had any chest pain.  Principal Problem:   Pneumonia Active Problems:   HYPERTENSION   Moderate aortic stenosis   Atrial fibrillation  Plan: Currently maintaining NSR.  On PO diltiazem and Eliquis.  Amio was DCd at admission.  It will be a hard sell to restart as the patient's daughter believes that is what caused her readmission.  On cefepime for PNA.  Negative blood cultures. Hgb is low but stable.      LOS: 2 days    Tarri Fuller PA-C 06/20/2013 8:31 AM  As above, patient seen and examined. Patient denies chest pain. Her dyspnea is improving. She is being treated for pneumonia. They have asked to discontinue amiodarone as they feel it has caused her issues. She remains in sinus rhythm. Continue present dose of Cardizem. Continue apixaban. Follow hemoglobin closely. If it decreases further we may need to discontinue. She's not having melena or hematochezia. Lelon Perla

## 2013-06-20 NOTE — Progress Notes (Addendum)
[facility] PROGRESS NOTE  Tenlee Wollin CBJ:[id number] DOB: 1921-04-23 DOA: 06/18/2013 PCP: Sherrie Mustache, MD  Assessment/Plan: 1-HCAP: -will continue vanc and cefepime -continue supportive care -continue PRN nebulizer and schedule pulmicort -will also continue flutter valve -follow clinical response -follow cx's -afebrile  2-UTI: patient with > 100,000 colonies; currently not complaining of dysuria -current antibiotics will cover urine infection as well  3-Atrial fibrillation presently rate controlled: per cardiology no cardioversion currently. -plan is to continue cardizem and eliquis -amiodarone discontinued given lung problems.  4-anemia: patient with hx of AOCD with mild iron deficiency component  -low but stable -will monitor Hgb trend -if less than 7.5 will transfuse and might need to hold eliquis. -will check FOBT  5-Chronic diastolic HF - last EF measured was last week 65-70% - continue Lasix.   6-Hypertension - continue present medications. BP stable.  7-Moderate aortic stenosis: per cardiology.  Code Status: Full Family Communication: daughter and son in law at bedside Disposition Plan: home when medically stable   Consultants:  Cardiology   Procedures:  See below for x-ray reports   Antibiotics:  vanc  Cefepime   HPI/Subjective: Feeling better, no fever. Reports breathing is improving.  Objective: Filed Vitals:   06/20/13 1416  BP: 120/55  Pulse: 89  Temp: 98.9 F (37.2 C)  Resp: 18    Intake/Output Summary (Last 24 hours) at 06/20/13 1515 Last data filed at 06/20/13 1405  Gross per 24 hour  Intake   1643 ml  Output   1301 ml  Net    342 ml   Filed Weights   06/19/13 0530  Weight: 57.425 kg (126 lb 9.6 oz)    Exam:   General:  NAD, afebrile, feeling better and breathing more comfortable  Cardiovascular: S1 and S2, no rubs or gallops  Respiratory: scattered rhonchi, no crackles  Abdomen: soft, NT, ND,  positive BS  Musculoskeletal: no edema  Data Reviewed: Basic Metabolic Panel:  Recent Labs Lab 06/13/13 1547 06/14/13 0353 06/15/13 0516 06/19/13 0144 06/19/13 0805 06/20/13 0520  NA  --  141 144 144 140 136*  K  --  4.0 4.1 3.7 3.7 3.8  CL  --  103 104 103 101 99  CO2  --  27 27 28 26 28   GLUCOSE  --  109* 103* 111* 133* 113*  BUN  --  24* 25* 21 20 20   CREATININE  --  0.93 1.11* 0.94 0.89 0.91  CALCIUM  --  8.8 8.8 9.0 8.4 8.7  MG 2.2  --   --   --   --   --    Liver Function Tests:  Recent Labs Lab 06/15/13 0516 06/19/13 0144 06/19/13 0805  AST 11 23 15   ALT 6 18 15   ALKPHOS 58 87 70  BILITOT 0.8 1.3* 1.9*  PROT 5.6* 6.8 5.6*  ALBUMIN 3.4* 4.0 3.2*   CBC:  Recent Labs Lab 06/14/13 0353 06/19/13 0144 06/19/13 0855 06/19/13 1543 06/20/13 0520  WBC 5.1 10.8* 9.3 10.7* 8.1  NEUTROABS  --  9.3* 8.1*  --   --   HGB 10.4* 10.1* 8.3* 8.8* 8.0*  HCT 31.8* 30.8* 25.1* 26.1* 24.1*  MCV 89.8 89.8 90.0 88.5 89.6  PLT 164 188 144* 160 147*   Cardiac Enzymes:  Recent Labs Lab 06/13/13 1547 06/13/13 2122 06/14/13 0353  TROPONINI <0.30 <0.30 <0.30   BNP (last 3 results)  Recent Labs  07/03/12 1200 06/13/13 1125 06/19/13 0220  PROBNP 3694.0* 4974.0* 2094.0*   CBG: No  results found for this basename: GLUCAP,  in the last 168 hours  Recent Results (from the past 240 hour(s))  URINE CULTURE     Status: None   Collection Time    06/13/13 12:17 PM      Result Value Ref Range Status   Specimen Description URINE, CLEAN CATCH   Final   Special Requests NONE   Final   Culture  Setup Time     Final   Value: 06/13/2013 16:30     Performed at [place]     Final   Value: >=100,000 COLONIES/ML     Performed at [facility]   Culture     Final   Value: Multiple bacterial morphotypes present, none predominant. Suggest appropriate recollection if clinically indicated.     Performed at [facility]   Report Status  06/14/2013 FINAL   Final  CULTURE, BLOOD (ROUTINE X 2)     Status: None   Collection Time    06/19/13  2:38 AM      Result Value Ref Range Status   Specimen Description BLOOD RIGHT ANTECUBITAL   Final   Special Requests BOTTLES DRAWN AEROBIC AND ANAEROBIC 10CC EA   Final   Culture  Setup Time     Final   Value: 06/19/2013 09:09     Performed at [facility]   Culture     Final   Value:        BLOOD CULTURE RECEIVED NO GROWTH TO DATE CULTURE WILL BE HELD FOR 5 DAYS BEFORE ISSUING A FINAL NEGATIVE REPORT     Performed at [facility]   Report Status PENDING   Incomplete  CULTURE, BLOOD (ROUTINE X 2)     Status: None   Collection Time    06/19/13  2:44 AM      Result Value Ref Range Status   Specimen Description BLOOD RIGHT HAND   Final   Special Requests BOTTLES DRAWN AEROBIC ONLY 5CC   Final   Culture  Setup Time     Final   Value: 06/19/2013 09:09     Performed at [facility]   Culture     Final   Value:        BLOOD CULTURE RECEIVED NO GROWTH TO DATE CULTURE WILL BE HELD FOR 5 DAYS BEFORE ISSUING A FINAL NEGATIVE REPORT     Performed at [facility]   Report Status PENDING   Incomplete     Studies: Dg Chest Port 1 View  06/19/2013   CLINICAL DATA:  Shortness of breath.  EXAM: PORTABLE CHEST - 1 VIEW  COMPARISON:  Chest radiograph performed 06/13/2013  FINDINGS: The lungs are well-aerated. Mild bibasilar airspace opacities may reflect atelectasis or mild pneumonia. Mild peribronchial thickening is again seen. There is no evidence of pleural effusion or pneumothorax.  The cardiomediastinal silhouette is borderline enlarged. No acute osseous abnormalities are seen.  IMPRESSION: Mild bibasilar airspace opacities may reflect atelectasis or mild pneumonia. Mild peribronchial thickening seen. Borderline cardiomegaly.   Electronically Signed   By: Garald Balding M.D.   On: 06/19/2013 03:16    Scheduled Meds: . apixaban  2.5 mg Oral BID  . budesonide  (PULMICORT) nebulizer solution  0.25 mg Nebulization BID  . ceFEPime (MAXIPIME) IV  1 g Intravenous Q24H  . cyanocobalamin  500 mcg Oral Daily  . diltiazem  60 mg Oral TID  . furosemide  20 mg Oral Daily  .  pantoprazole  40 mg Oral Daily  . sodium chloride  3 mL Intravenous Q12H  . sodium chloride  3 mL Intravenous Q12H  . vancomycin  750 mg Intravenous Q24H  . zolpidem  5 mg Oral QHS   Continuous Infusions:   Principal Problem:   Pneumonia Active Problems:   HYPERTENSION   Moderate aortic stenosis   Atrial fibrillation    Time spent: >30 minutes    [place] [facility] Pager [phone]. If 7PM-7AM, please contact night-coverage at [email or web address], password [facility] 06/20/2013, 3:15 PM  LOS: 2 days

## 2013-06-20 NOTE — Progress Notes (Signed)
UR completed Kumiko Fishman K. Aliviana Burdell, RN, BSN, [place], CCM  06/20/2013 11:05 AM

## 2013-06-21 ENCOUNTER — Inpatient Hospital Stay (HOSPITAL_COMMUNITY): Payer: Medicare Other

## 2013-06-21 LAB — CBC
HCT: 22.8 % — ABNORMAL LOW (ref 36.0–46.0)
Hemoglobin: 7.6 g/dL — ABNORMAL LOW (ref 12.0–15.0)
MCH: 29.7 pg (ref 26.0–34.0)
MCHC: 33.3 g/dL (ref 30.0–36.0)
MCV: 89.1 fL (ref 78.0–100.0)
PLATELETS: 152 10*3/uL (ref 150–400)
RBC: 2.56 MIL/uL — ABNORMAL LOW (ref 3.87–5.11)
RDW: 16.1 % — AB (ref 11.5–15.5)
WBC: 6.9 10*3/uL (ref 4.0–10.5)

## 2013-06-21 LAB — BASIC METABOLIC PANEL
BUN: 21 mg/dL (ref 6–23)
CALCIUM: 8.5 mg/dL (ref 8.4–10.5)
CHLORIDE: 101 meq/L (ref 96–112)
CO2: 26 meq/L (ref 19–32)
Creatinine, Ser: 1.02 mg/dL (ref 0.50–1.10)
GFR calc non Af Amer: 47 mL/min — ABNORMAL LOW (ref >90)
GFR, EST AFRICAN AMERICAN: 54 mL/min — AB (ref >90)
Glucose, Bld: 121 mg/dL — ABNORMAL HIGH (ref 70–99)
Potassium: 3.8 mEq/L (ref 3.7–5.3)
Sodium: 140 mEq/L (ref 137–147)

## 2013-06-21 MED ORDER — FERROUS SULFATE 325 (65 FE) MG PO TABS
325.0000 mg | ORAL_TABLET | Freq: Three times a day (TID) | ORAL | Status: DC
  Administered 2013-06-21 – 2013-06-23 (×6): 325 mg via ORAL
  Filled 2013-06-21 (×9): qty 1

## 2013-06-21 MED ORDER — POLYETHYLENE GLYCOL 3350 17 G PO PACK
17.0000 g | PACK | Freq: Every day | ORAL | Status: DC
  Administered 2013-06-22: 17 g via ORAL
  Filled 2013-06-21 (×3): qty 1

## 2013-06-21 MED ORDER — LEVOFLOXACIN 750 MG PO TABS
750.0000 mg | ORAL_TABLET | ORAL | Status: DC
  Administered 2013-06-21 – 2013-06-23 (×2): 750 mg via ORAL
  Filled 2013-06-21 (×2): qty 1

## 2013-06-21 NOTE — Progress Notes (Signed)
Subjective: Denies dyspnea or chest pain; mild cough  Objective: Vital signs in last 24 hours: Temp:  [98.6 F (37 C)-99.4 F (37.4 C)] 98.6 F (37 C) (05/27 0602) Pulse Rate:  [71-89] 71 (05/27 0602) Resp:  [18] 18 (05/27 0602) BP: (120-129)/(55-81) 129/81 mmHg (05/27 0602) SpO2:  [91 %-98 %] 94 % (05/27 0602) Weight:  [127 lb 12.8 oz (57.97 kg)] 127 lb 12.8 oz (57.97 kg) (05/27 0602) Last BM Date: 06/20/13  Intake/Output from previous day: 05/26 0701 - 05/27 0700 In: 0998 [P.O.:1200; I.V.:3; IV Piggyback:200] Out: 3382 [Urine:1151] Intake/Output this shift:    Medications Current Facility-Administered Medications  Medication Dose Route Frequency Provider Last Rate Last Dose  . acetaminophen (TYLENOL) tablet 650 mg  650 mg Oral Q6H PRN Rise Patience, MD   650 mg at 06/19/13 2002   Or  . acetaminophen (TYLENOL) suppository 650 mg  650 mg Rectal Q6H PRN Rise Patience, MD      . acetaminophen (TYLENOL) tablet 650 mg  650 mg Oral Q6H PRN Rise Patience, MD   650 mg at 06/19/13 5053  . apixaban (ELIQUIS) tablet 2.5 mg  2.5 mg Oral BID Rise Patience, MD   2.5 mg at 06/20/13 2208  . budesonide (PULMICORT) nebulizer solution 0.25 mg  0.25 mg Nebulization BID Barton Dubois, MD   0.25 mg at 06/20/13 2155  . ceFEPIme (MAXIPIME) 1 g in dextrose 5 % 50 mL IVPB  1 g Intravenous Q24H Tomasita Morrow, RPH   1 g at 06/20/13 2021  . cyanocobalamin tablet 500 mcg  500 mcg Oral Daily Rise Patience, MD   500 mcg at 06/20/13 1404  . diltiazem (CARDIZEM) tablet 60 mg  60 mg Oral TID Rise Patience, MD   60 mg at 06/20/13 2208  . furosemide (LASIX) tablet 20 mg  20 mg Oral Daily Barton Dubois, MD   20 mg at 06/20/13 0933  . levalbuterol (XOPENEX) nebulizer solution 0.63 mg  0.63 mg Nebulization Q6H PRN Barton Dubois, MD   0.63 mg at 06/19/13 1557  . ondansetron (ZOFRAN) tablet 4 mg  4 mg Oral Q6H PRN Rise Patience, MD       Or  . ondansetron [facility])  injection 4 mg  4 mg Intravenous Q6H PRN Rise Patience, MD      . pantoprazole (PROTONIX) EC tablet 40 mg  40 mg Oral Daily Rise Patience, MD   40 mg at 06/19/13 1030  . polyethylene glycol (MIRALAX / GLYCOLAX) packet 17 g  17 g Oral Daily Charlynne Cousins, MD      . sodium chloride 0.9 % injection 3 mL  3 mL Intravenous Q12H Rise Patience, MD   3 mL at 06/20/13 2211  . sodium chloride 0.9 % injection 3 mL  3 mL Intravenous Q12H Rise Patience, MD   3 mL at 06/20/13 0933  . vancomycin (VANCOCIN) IVPB 750 mg/150 ml premix  750 mg Intravenous Q24H Tomasita Morrow, RPH   750 mg at 06/21/13 0415  . zolpidem (AMBIEN) tablet 2.5 mg  2.5 mg Oral QHS Jeryl Columbia, NP   2.5 mg at 06/20/13 2208    PE: WD frail in NAD HEENT normal Neck supple Chest mild basilar crackles CV RRR 3/6 systolic murmur abd soft  Ext no edema  Lab Results:   Recent Labs  06/19/13 1543 06/20/13 0520 06/21/13 0537  WBC 10.7* 8.1 6.9  HGB 8.8* 8.0* 7.6*  HCT 26.1* 24.1* 22.8*  PLT 160 147* 152   BMET  Recent Labs  06/19/13 0805 06/20/13 0520 06/21/13 0537  NA 140 136* 140  K 3.7 3.8 3.8  CL 101 99 101  CO2 26 28 26   GLUCOSE 133* 113* 121*  BUN 20 20 21   CREATININE 0.89 0.91 1.02  CALCIUM 8.4 8.7 8.5    Assessment/Plan 1 atrial fibrillation-the patient remains in sinus rhythm. Continue present dose of Cardizem. Amiodarone discontinued at daughter's request as she felt it was contributing to pulmonary issues. Her hemoglobin continues to fall. There is no signs of active bleeding but I'm hesitant to continue apixaban at this point. I will discontinue. She has an allergy to aspirin. We will hold anticoagulation until it is clear hemoglobin is stable. A decision can be made concerning reinitiation of apixaban versus treating with Plavix. I explained to the patient that there is a higher risk of CVA off of anticoagulation but given severity of anemia I think we have no choice  at this point. 2 pneumonia-continue antibiotics-management per primary care. 3 moderate aortic stenosis. 4 chronic diastolic congestive heart failure-continue present dose of Lasix. 5 Anemia-further evaluation per primary care.   Lelon Perla

## 2013-06-21 NOTE — Evaluation (Signed)
Physical Therapy Evaluation Patient Details Name: Anna Elliott MRN: [id number] DOB: April 19, 1921 Today's Date: 06/21/2013   History of Present Illness  80 y.o. female with history of atrial fibrillation was recently admitted for A. fib with RVR and as per patient's daughter patient was originally scheduled for cardioversion later tomorrow was brought to the ER patient has been having shortness of breath with throat congestion wheezing nonproductive cough over the last 2 days.  Clinical Impression  Pt adm due to the above. Presents with balance deficits and decreased independence with functional mobility. Pt to benefit from skilled acute PT to address deficits indicated below and maximize functional mobility prior to D/C home. Pt would prefer to ambulate with cane, if any AD is necessary upon D/C. Will cont to assess gait and need for AD.     Follow Up Recommendations Outpatient PT;Supervision/Assistance - 24 hour;Other (comment) (for balance deficits )    Equipment Recommendations  Cane;Rolling walker with 5" wheels (cane vs RW pending progress with mobility )    Recommendations for Other Services       Precautions / Restrictions Precautions Precautions: Fall Precaution Comments: macular degeneration  Restrictions Weight Bearing Restrictions: No      Mobility  Bed Mobility               General bed mobility comments: not assessed; pt sitting EOB upon entering and returned to EOB   Transfers Overall transfer level: Needs assistance Equipment used: None Transfers: Sit to/from Stand Sit to Stand: Min guard         General transfer comment: min guard to steady and for safety; slight sway; no c/o dizziness    Ambulation/Gait Ambulation/Gait assistance: Min assist Ambulation Distance (Feet): 100 Feet Assistive device: 1 person hand held assist;None Gait Pattern/deviations: Step-through pattern;Decreased stride length;Shuffle;Drifts right/left;Narrow base of support Gait  velocity: decreased Gait velocity interpretation: Below normal speed for age/gender General Gait Details: pt with balance disturbance; required handheld (A) and use of gt belt at times to steady; may benefit with cane vs RW to increase stability; limited ambulation distance due to fatigue; LEs buckling at end of session due to fatigue   Stairs            Wheelchair Mobility    Modified Rankin (Stroke Patients Only)       Balance Overall balance assessment: Needs assistance Sitting-balance support: Feet supported;No upper extremity supported Sitting balance-Leahy Scale: Good     Standing balance support: During functional activity;No upper extremity supported Standing balance-Leahy Scale: Fair Standing balance comment: slight sway                              Pertinent Vitals/Pain 98% on RA after ambulating     Home Living Family/patient expects to be discharged to:: Private residence Living Arrangements: Children Available Help at Discharge: Family;Available 24 hours/day Type of Home: House Home Access: Stairs to enter Entrance Stairs-Rails: None Entrance Stairs-Number of Steps: 2 Home Layout: One level Home Equipment: None Additional Comments: pt has walk in shower with seat    Prior Function Level of Independence: Independent         Comments: daughter does driving; reports pt is very independent and active       Hand Dominance        Extremity/Trunk Assessment   Upper Extremity Assessment: Defer to OT evaluation           Lower Extremity Assessment: Generalized weakness  Cervical / Trunk Assessment: Normal  Communication   Communication: No difficulties  Cognition Arousal/Alertness: Awake/alert Behavior During Therapy: WFL for tasks assessed/performed Overall Cognitive Status: Within Functional Limits for tasks assessed                      General Comments General comments (skin integrity, edema, etc.): O2  after ambulation 98% when ambulating on RA    Exercises        Assessment/Plan    PT Assessment Patient needs continued PT services  PT Diagnosis Abnormality of gait;Generalized weakness   PT Problem List Decreased strength;Decreased activity tolerance;Decreased balance;Decreased mobility;Cardiopulmonary status limiting activity  PT Treatment Interventions DME instruction;Gait training;Stair training;Functional mobility training;Therapeutic activities;Therapeutic exercise;Balance training;Neuromuscular re-education;Patient/family education   PT Goals (Current goals can be found in the Care Plan section) Acute Rehab PT Goals Patient Stated Goal: to get back to being independent  PT Goal Formulation: With patient Time For Goal Achievement: 06/27/13 Potential to Achieve Goals: Good    Frequency Min 3X/week   Barriers to discharge        Co-evaluation               End of Session Equipment Utilized During Treatment: Gait belt Activity Tolerance: Patient tolerated treatment well Patient left: in bed;with call bell/phone within reach;with family/visitor present Nurse Communication: Mobility status         Time: 0940-1000 PT Time Calculation (min): 20 min   Charges:   PT Evaluation $Initial PT Evaluation Tier I: 1 Procedure PT Treatments $Gait Training: 8-22 mins   PT G CodesGustavus Bryant, [place]  [phone] 06/21/2013, 10:11 AM

## 2013-06-21 NOTE — Progress Notes (Addendum)
[facility] PROGRESS NOTE  Erick Oxendine ZOX:[id number] DOB: Dec 23, 1921 DOA: 06/18/2013 PCP: Sherrie Mustache, MD  Assessment/Plan: HCAP: - Started on vanc and cefepime on admission, de-escalated to levaquin. - Continue PRN nebulizer and schedule pulmicort - Will also continue flutter valve - Afebrile, the daughter is demanding a CT of the chest,  I have explained to her that a CT scan is not indicated at this point.  UTI:  - Patient with > 100,000 colonies; currently not complaining of dysuria - Levaquin should cover.  Atrial fibrillation presently rate controlled:  - Per cardiology no cardioversion. - Plan is to continue cardizem, hold eliquis - Amiodarone discontinued given lung problems.  Anemia:  - Patient with hx of AOCD with mild iron deficiency component  - Low but stable - Will monitor Hgb trend - Will check FOBT  Chronic diastolic HF: - Last EF measured was last week 65-70%. - Continue Lasix.   Hypertension: - continue present medications. BP stable.  Moderate aortic stenosis: per cardiology.  Code Status: Full Family Communication: daughter and son in law at bedside Disposition Plan: home when medically stable   Consultants:  Cardiology   Procedures:  See below for x-ray reports   Antibiotics:  vanc  Cefepime   HPI/Subjective: Feeling better, no fever. Reports breathing is improving.  Objective: Filed Vitals:   06/21/13 0602  BP: 129/81  Pulse: 71  Temp: 98.6 F (37 C)  Resp: 18    Intake/Output Summary (Last 24 hours) at 06/21/13 0731 Last data filed at 06/21/13 0600  Gross per 24 hour  Intake   1403 ml  Output   1151 ml  Net    252 ml   Filed Weights   06/19/13 0530 06/21/13 0602  Weight: 57.425 kg (126 lb 9.6 oz) 57.97 kg (127 lb 12.8 oz)    Exam:   General:  NAD, afebrile, feeling better and breathing more comfortable  Cardiovascular: S1 and S2, no rubs or gallops  Respiratory: scattered rhonchi, no  crackles  Abdomen: soft, NT, ND, positive BS  Musculoskeletal: no edema  Data Reviewed: Basic Metabolic Panel:  Recent Labs Lab 06/15/13 0516 06/19/13 0144 06/19/13 0805 06/20/13 0520 06/21/13 0537  NA 144 144 140 136* 140  K 4.1 3.7 3.7 3.8 3.8  CL 104 103 101 99 101  CO2 27 28 26 28 26   GLUCOSE 103* 111* 133* 113* 121*  BUN 25* 21 20 20 21   CREATININE 1.11* 0.94 0.89 0.91 1.02  CALCIUM 8.8 9.0 8.4 8.7 8.5   Liver Function Tests:  Recent Labs Lab 06/15/13 0516 06/19/13 0144 06/19/13 0805  AST 11 23 15   ALT 6 18 15   ALKPHOS 58 87 70  BILITOT 0.8 1.3* 1.9*  PROT 5.6* 6.8 5.6*  ALBUMIN 3.4* 4.0 3.2*   CBC:  Recent Labs Lab 06/19/13 0144 06/19/13 0855 06/19/13 1543 06/20/13 0520 06/21/13 0537  WBC 10.8* 9.3 10.7* 8.1 6.9  NEUTROABS 9.3* 8.1*  --   --   --   HGB 10.1* 8.3* 8.8* 8.0* 7.6*  HCT 30.8* 25.1* 26.1* 24.1* 22.8*  MCV 89.8 90.0 88.5 89.6 89.1  PLT 188 144* 160 147* 152   Cardiac Enzymes: No results found for this basename: CKTOTAL, CKMB, CKMBINDEX, TROPONINI,  in the last 168 hours BNP (last 3 results)  Recent Labs  07/03/12 1200 06/13/13 1125 06/19/13 0220  PROBNP 3694.0* 4974.0* 2094.0*   CBG: No results found for this basename: GLUCAP,  in the last 168 hours  Recent Results (from the  past 240 hour(s))  URINE CULTURE     Status: None   Collection Time    06/13/13 12:17 PM      Result Value Ref Range Status   Specimen Description URINE, CLEAN CATCH   Final   Special Requests NONE   Final   Culture  Setup Time     Final   Value: 06/13/2013 16:30     Performed at [facility]     Final   Value: >=100,000 COLONIES/ML     Performed at [facility]   Culture     Final   Value: Multiple bacterial morphotypes present, none predominant. Suggest appropriate recollection if clinically indicated.     Performed at [facility]   Report Status 06/14/2013 FINAL   Final  CULTURE, BLOOD (ROUTINE X 2)      Status: None   Collection Time    06/19/13  2:38 AM      Result Value Ref Range Status   Specimen Description BLOOD RIGHT ANTECUBITAL   Final   Special Requests BOTTLES DRAWN AEROBIC AND ANAEROBIC 10CC EA   Final   Culture  Setup Time     Final   Value: 06/19/2013 09:09     Performed at [facility]   Culture     Final   Value:        BLOOD CULTURE RECEIVED NO GROWTH TO DATE CULTURE WILL BE HELD FOR 5 DAYS BEFORE ISSUING A FINAL NEGATIVE REPORT     Performed at [facility]   Report Status PENDING   Incomplete  CULTURE, BLOOD (ROUTINE X 2)     Status: None   Collection Time    06/19/13  2:44 AM      Result Value Ref Range Status   Specimen Description BLOOD RIGHT HAND   Final   Special Requests BOTTLES DRAWN AEROBIC ONLY 5CC   Final   Culture  Setup Time     Final   Value: 06/19/2013 09:09     Performed at [facility]   Culture     Final   Value:        BLOOD CULTURE RECEIVED NO GROWTH TO DATE CULTURE WILL BE HELD FOR 5 DAYS BEFORE ISSUING A FINAL NEGATIVE REPORT     Performed at [facility]   Report Status PENDING   Incomplete     Studies: No results found.  Scheduled Meds: . apixaban  2.5 mg Oral BID  . budesonide (PULMICORT) nebulizer solution  0.25 mg Nebulization BID  . ceFEPime (MAXIPIME) IV  1 g Intravenous Q24H  . cyanocobalamin  500 mcg Oral Daily  . diltiazem  60 mg Oral TID  . furosemide  20 mg Oral Daily  . pantoprazole  40 mg Oral Daily  . sodium chloride  3 mL Intravenous Q12H  . sodium chloride  3 mL Intravenous Q12H  . vancomycin  750 mg Intravenous Q24H  . zolpidem  2.5 mg Oral QHS   Continuous Infusions:   Principal Problem:   Pneumonia Active Problems:   HYPERTENSION   Moderate aortic stenosis   Atrial fibrillation    Time spent: >30 minutes    [facility] Pager [phone]. If 7PM-7AM, please contact night-coverage at [email or web address], password [facility] 06/21/2013, 7:31 AM  LOS: 3  days

## 2013-06-22 DIAGNOSIS — I5033 Acute on chronic diastolic (congestive) heart failure: Secondary | ICD-10-CM

## 2013-06-22 DIAGNOSIS — I509 Heart failure, unspecified: Secondary | ICD-10-CM

## 2013-06-22 MED ORDER — FUROSEMIDE 10 MG/ML IJ SOLN
40.0000 mg | Freq: Once | INTRAMUSCULAR | Status: AC
  Administered 2013-06-22: 40 mg via INTRAVENOUS
  Filled 2013-06-22: qty 4

## 2013-06-22 NOTE — Progress Notes (Addendum)
[facility] PROGRESS NOTE  Anna Elliott HYW:[id number] DOB: 1921-05-18 DOA: 06/18/2013 PCP: Sherrie Mustache, MD  Assessment/Plan: HCAP: - Started on vanc and cefepime on admission, de-escalated to levaquin. - Continue PRN nebulizer and schedule pulmicort - Will also continue flutter valve - Afebrile, the daughter is demanding a CT of the chest:Small BILATERAL pleural effusions and bibasilar atelectasis.  Patchy airspace infiltrates in both upper lobes most likely representing pneumonia. Nonspecific minimally enlarged precarinal lymph node.   UTI:  - Patient with > 100,000 colonies; currently not complaining of dysuria - Levaquin should cover.  Atrial fibrillation presently rate controlled:  - Per cardiology now in Max. - Plan is to continue cardizem, hold eliquis - Amiodarone discontinued as per daughter request.  Anemia:  - Patient with hx of AOCD with mild iron deficiency component  - Low but stable, pending CBC. - Will monitor Hgb trend - Will check FOBT  Chronic diastolic HF: - Last EF measured was last week 65-70%. - Give extra lasix dose. Lasix. Small bilateral pleural effusion.  Hypertension: - continue present medications. BP stable.  Moderate aortic stenosis: per cardiology.  Code Status: Full Family Communication: daughter and son in law at bedside Disposition Plan: home when medically stable   Consultants:  Cardiology   Procedures:  See below for x-ray reports   Antibiotics:  vanc  Cefepime   HPI/Subjective: Feeling better, no fever. Reports breathing is improving.  Objective: Filed Vitals:   06/22/13 0528  BP: 126/45  Pulse: 72  Temp: 98.1 F (36.7 C)  Resp: 18    Intake/Output Summary (Last 24 hours) at 06/22/13 0807 Last data filed at 06/22/13 0532  Gross per 24 hour  Intake    720 ml  Output   1401 ml  Net   -681 ml   Filed Weights   06/19/13 0530 06/21/13 0602 06/22/13 0528  Weight: 57.425 kg (126 lb 9.6 oz)  57.97 kg (127 lb 12.8 oz) 57.153 kg (126 lb)    Exam:   General:  NAD, afebrile, feeling better and breathing more comfortable  Cardiovascular: S1 and S2, no rubs or gallops  Respiratory: scattered rhonchi, no crackles  Abdomen: soft, NT, ND, positive BS  Musculoskeletal: no edema  Data Reviewed: Basic Metabolic Panel:  Recent Labs Lab 06/19/13 0144 06/19/13 0805 06/20/13 0520 06/21/13 0537  NA 144 140 136* 140  K 3.7 3.7 3.8 3.8  CL 103 101 99 101  CO2 28 26 28 26   GLUCOSE 111* 133* 113* 121*  BUN 21 20 20 21   CREATININE 0.94 0.89 0.91 1.02  CALCIUM 9.0 8.4 8.7 8.5   Liver Function Tests:  Recent Labs Lab 06/19/13 0144 06/19/13 0805  AST 23 15  ALT 18 15  ALKPHOS 87 70  BILITOT 1.3* 1.9*  PROT 6.8 5.6*  ALBUMIN 4.0 3.2*   CBC:  Recent Labs Lab 06/19/13 0144 06/19/13 0855 06/19/13 1543 06/20/13 0520 06/21/13 0537  WBC 10.8* 9.3 10.7* 8.1 6.9  NEUTROABS 9.3* 8.1*  --   --   --   HGB 10.1* 8.3* 8.8* 8.0* 7.6*  HCT 30.8* 25.1* 26.1* 24.1* 22.8*  MCV 89.8 90.0 88.5 89.6 89.1  PLT 188 144* 160 147* 152   Cardiac Enzymes: No results found for this basename: CKTOTAL, CKMB, CKMBINDEX, TROPONINI,  in the last 168 hours BNP (last 3 results)  Recent Labs  07/03/12 1200 06/13/13 1125 06/19/13 0220  PROBNP 3694.0* 4974.0* 2094.0*   CBG: No results found for this basename: GLUCAP,  in the last  168 hours  Recent Results (from the past 240 hour(s))  URINE CULTURE     Status: None   Collection Time    06/13/13 12:17 PM      Result Value Ref Range Status   Specimen Description URINE, CLEAN CATCH   Final   Special Requests NONE   Final   Culture  Setup Time     Final   Value: 06/13/2013 16:30     Performed at [facility]     Final   Value: >=100,000 COLONIES/ML     Performed at [facility]   Culture     Final   Value: Multiple bacterial morphotypes present, none predominant. Suggest appropriate recollection if  clinically indicated.     Performed at [facility]   Report Status 06/14/2013 FINAL   Final  CULTURE, BLOOD (ROUTINE X 2)     Status: None   Collection Time    06/19/13  2:38 AM      Result Value Ref Range Status   Specimen Description BLOOD RIGHT ANTECUBITAL   Final   Special Requests BOTTLES DRAWN AEROBIC AND ANAEROBIC 10CC EA   Final   Culture  Setup Time     Final   Value: 06/19/2013 09:09     Performed at [facility]   Culture     Final   Value:        BLOOD CULTURE RECEIVED NO GROWTH TO DATE CULTURE WILL BE HELD FOR 5 DAYS BEFORE ISSUING A FINAL NEGATIVE REPORT     Performed at [facility]   Report Status PENDING   Incomplete  CULTURE, BLOOD (ROUTINE X 2)     Status: None   Collection Time    06/19/13  2:44 AM      Result Value Ref Range Status   Specimen Description BLOOD RIGHT HAND   Final   Special Requests BOTTLES DRAWN AEROBIC ONLY 5CC   Final   Culture  Setup Time     Final   Value: 06/19/2013 09:09     Performed at [facility]   Culture     Final   Value:        BLOOD CULTURE RECEIVED NO GROWTH TO DATE CULTURE WILL BE HELD FOR 5 DAYS BEFORE ISSUING A FINAL NEGATIVE REPORT     Performed at [facility]   Report Status PENDING   Incomplete     Studies: Ct Chest Wo Contrast  06/22/2013   CLINICAL DATA:  Shortness of breath, evaluate parenchyma, history hypertension, atrial fibrillation, chronic diastolic heart failure  EXAM: CT CHEST WITHOUT CONTRAST  TECHNIQUE: Multidetector CT imaging of the chest was performed following the standard protocol without IV contrast. Sagittal and coronal MPR images reconstructed from axial data set.  COMPARISON:  Chest radiograph 06/19/2013  FINDINGS: Atherosclerotic calcifications aorta and coronary arteries.  Question mild splenic enlargement.  Minimally enlarged precarinal lymph node 12 mm short axis image 23.  Additional scattered normal-sized mediastinal nodes without additional adenopathy.   Small BILATERAL pleural effusions.  Mitral annular and aortic valvular calcifications noted.  Patchy airspace infiltrates are identified in both upper lobes question infection.  Dependent atelectasis in both lower lobes.  No pneumothorax or discrete pulmonary mass.  Diffuse osseous demineralization with probable vertebral hemangioma at T3 vertebral body.  IMPRESSION: Small BILATERAL pleural effusions and bibasilar atelectasis.  Patchy airspace infiltrates in both upper lobes most likely representing pneumonia.  Nonspecific minimally enlarged precarinal lymph  node.  Extensive atherosclerotic disease.  Question splenomegaly.   Electronically Signed   By: Lavonia Dana M.D.   On: 06/22/2013 02:57    Scheduled Meds: . budesonide (PULMICORT) nebulizer solution  0.25 mg Nebulization BID  . cyanocobalamin  500 mcg Oral Daily  . diltiazem  60 mg Oral TID  . ferrous sulfate  325 mg Oral TID WC  . furosemide  40 mg Intravenous Once  . furosemide  20 mg Oral Daily  . levofloxacin  750 mg Oral Q48H  . pantoprazole  40 mg Oral Daily  . polyethylene glycol  17 g Oral Daily  . sodium chloride  3 mL Intravenous Q12H  . sodium chloride  3 mL Intravenous Q12H  . zolpidem  2.5 mg Oral QHS   Continuous Infusions:   Principal Problem:   Pneumonia Active Problems:   HYPERTENSION   Moderate aortic stenosis   Atrial fibrillation    Time spent: >30 minutes    [facility] Pager [phone]. If 7PM-7AM, please contact night-coverage at [email or web address], password [facility] 06/22/2013, 8:07 AM  LOS: 4 days

## 2013-06-22 NOTE — Progress Notes (Signed)
Subjective: Denies dyspnea or chest pain  Objective: Vital signs in last 24 hours: Temp:  [97.7 F (36.5 C)-98.4 F (36.9 C)] 98.1 F (36.7 C) (05/28 0528) Pulse Rate:  [72-78] 72 (05/28 0528) Resp:  [18-20] 18 (05/28 0528) BP: (122-135)/(45-57) 126/45 mmHg (05/28 0528) SpO2:  [92 %-99 %] 99 % (05/28 0528) Weight:  [126 lb (57.153 kg)] 126 lb (57.153 kg) (05/28 0528) Last BM Date: 06/21/13  Intake/Output from previous day: 05/27 0701 - 05/28 0700 In: 720 [P.O.:720] Out: 1401 [Urine:1400; Stool:1] Intake/Output this shift:    Medications Current Facility-Administered Medications  Medication Dose Route Frequency Provider Last Rate Last Dose  . acetaminophen (TYLENOL) tablet 650 mg  650 mg Oral Q6H PRN Rise Patience, MD   650 mg at 06/21/13 2233   Or  . acetaminophen (TYLENOL) suppository 650 mg  650 mg Rectal Q6H PRN Rise Patience, MD      . acetaminophen (TYLENOL) tablet 650 mg  650 mg Oral Q6H PRN Rise Patience, MD   650 mg at 06/19/13 6301  . budesonide (PULMICORT) nebulizer solution 0.25 mg  0.25 mg Nebulization BID Barton Dubois, MD   0.25 mg at 06/21/13 0847  . cyanocobalamin tablet 500 mcg  500 mcg Oral Daily Rise Patience, MD   500 mcg at 06/21/13 1041  . diltiazem (CARDIZEM) tablet 60 mg  60 mg Oral TID Rise Patience, MD   60 mg at 06/21/13 2233  . ferrous sulfate tablet 325 mg  325 mg Oral TID WC Charlynne Cousins, MD   325 mg at 06/22/13 0630  . furosemide (LASIX) tablet 20 mg  20 mg Oral Daily Barton Dubois, MD   20 mg at 06/21/13 1041  . levalbuterol (XOPENEX) nebulizer solution 0.63 mg  0.63 mg Nebulization Q6H PRN Barton Dubois, MD   0.63 mg at 06/22/13 0147  . levofloxacin (LEVAQUIN) tablet 750 mg  750 mg Oral Q48H Charlynne Cousins, MD   750 mg at 06/21/13 1336  . ondansetron (ZOFRAN) tablet 4 mg  4 mg Oral Q6H PRN Rise Patience, MD       Or  . ondansetron [facility]) injection 4 mg  4 mg Intravenous Q6H PRN Rise Patience, MD      . pantoprazole (PROTONIX) EC tablet 40 mg  40 mg Oral Daily Rise Patience, MD   40 mg at 06/19/13 1030  . polyethylene glycol (MIRALAX / GLYCOLAX) packet 17 g  17 g Oral Daily Charlynne Cousins, MD      . sodium chloride 0.9 % injection 3 mL  3 mL Intravenous Q12H Rise Patience, MD   3 mL at 06/21/13 1042  . sodium chloride 0.9 % injection 3 mL  3 mL Intravenous Q12H Rise Patience, MD   3 mL at 06/21/13 2234  . zolpidem (AMBIEN) tablet 2.5 mg  2.5 mg Oral QHS Rhetta Mura Schorr, NP   2.5 mg at 06/21/13 2233    PE: WD frail in NAD HEENT normal Neck supple Chest CTA CV RRR 2/6 systolic murmur abd soft  Ext no edema  Lab Results:   Recent Labs  06/19/13 1543 06/20/13 0520 06/21/13 0537  WBC 10.7* 8.1 6.9  HGB 8.8* 8.0* 7.6*  HCT 26.1* 24.1* 22.8*  PLT 160 147* 152   BMET  Recent Labs  06/19/13 0805 06/20/13 0520 06/21/13 0537  NA 140 136* 140  K 3.7 3.8 3.8  CL 101 99 101  CO2  26 28 26   GLUCOSE 133* 113* 121*  BUN 20 20 21   CREATININE 0.89 0.91 1.02  CALCIUM 8.4 8.7 8.5    Assessment/Plan 1 atrial fibrillation-the patient remains in sinus rhythm on exam. Continue present dose of Cardizem. Amiodarone discontinued at daughter's request as she felt it was contributing to pulmonary issues. Her hemoglobin is low. There are no signs of active bleeding but I'm hesitant to continue apixaban; discontinued 5/27. She has an allergy to aspirin. We will hold anticoagulation until it is clear hemoglobin is stable. A decision can be made concerning reinitiation of apixaban versus treating with Plavix when she sees Dr Mare Ferrari back in the office. I explained to the patient that there is a higher risk of CVA off of anticoagulation but given severity of anemia I think we have no choice at this point. 2 pneumonia-continue antibiotics-management per primary care. 3 moderate aortic stenosis. 4 chronic diastolic congestive heart failure-continue  present dose of Lasix. 5 Anemia-further evaluation per primary care.   Lelon Perla

## 2013-06-22 NOTE — Progress Notes (Signed)
UR completed [place] K. Makhiya Coburn, RN, BSN, [place], CCM  06/22/2013 12:25 PM

## 2013-06-22 NOTE — Progress Notes (Signed)
Physical Therapy Treatment Patient Details Name: Anna Elliott MRN: [id number] DOB: [birth year] Today's Date: 06/22/2013    History of Present Illness 80 y.o. female with history of atrial fibrillation was recently admitted for A. fib with RVR and as per patient's daughter patient was originally scheduled for cardioversion later tomorrow was brought to the ER patient has been having shortness of breath with throat congestion wheezing nonproductive cough over the last 2 days.    PT Comments    Pt progressing towards physical therapy goals. Some sequencing difficulties with SPC as she has never used an AD before, however overall pt appeared to be steadier with no physical assist required during session. Hip pain on L during some exercises, and states she feels she has pulled a muscle 2 days ago. PT gave instant heat pack to pt and positioned it with fabric barrier against skin. Will continue to follow.   Follow Up Recommendations  Home health PT;Supervision/Assistance - 24 hour     Equipment Recommendations  Cane [facility], NOT quad cane)    Recommendations for Other Services       Precautions / Restrictions Precautions Precautions: Fall Precaution Comments: macular degeneration  Restrictions Weight Bearing Restrictions: No    Mobility  Bed Mobility               General bed mobility comments: Pt sitting EOB flossing teeth when PT entered  Transfers Overall transfer level: Needs assistance Equipment used: Straight cane Transfers: Sit to/from Stand Sit to Stand: Min guard         General transfer comment: Pt able to power-up to full stand without assist. Slight sway, but no LOB noted.   Ambulation/Gait Ambulation/Gait assistance: Min guard Ambulation Distance (Feet): 100 Feet Assistive device: Straight cane Gait Pattern/deviations: Step-through pattern;Decreased stride length;Trunk flexed Gait velocity: decreased Gait velocity interpretation: Below normal speed for  age/gender General Gait Details: VC's for sequencing and safety awareness with the SPC. Pt with occasional unsteadiness with the cane, but no physical assist required to recover. Some unsteadiness coming from first time using an AD and trying to coordinate cane advancement with LE advancement.    Stairs            Wheelchair Mobility    Modified Rankin (Stroke Patients Only)       Balance Overall balance assessment: Needs assistance Sitting-balance support: Feet supported;No upper extremity supported Sitting balance-Leahy Scale: Good     Standing balance support: Single extremity supported Standing balance-Leahy Scale: Fair                      Cognition Arousal/Alertness: Awake/alert Behavior During Therapy: WFL for tasks assessed/performed Overall Cognitive Status: Within Functional Limits for tasks assessed                      Exercises General Exercises - Lower Extremity Ankle Circles/Pumps: 10 reps Long Arc Quad: 10 reps;Strengthening Hip ABduction/ADduction: 10 reps (isometric pillow squeeze) Straight Leg Raises: 10 reps    General Comments        Pertinent Vitals/Pain Vitals stable throughout session on RA    Home Living                      Prior Function            PT Goals (current goals can now be found in the care plan section) Acute Rehab PT Goals Patient Stated Goal: to get back to being independent  PT Goal Formulation: With patient Time For Goal Achievement: 06/27/13 Potential to Achieve Goals: Good Progress towards PT goals: Progressing toward goals    Frequency  Min 3X/week    PT Plan Current plan remains appropriate    Co-evaluation             End of Session Equipment Utilized During Treatment: Gait belt Activity Tolerance: Patient tolerated treatment well Patient left: in chair;with call bell/phone within reach;with family/visitor present     Time: 0102-7253 PT Time Calculation (min): 31  min  Charges:  $Gait Training: 8-22 mins $Therapeutic Exercise: 8-22 mins                    G Codes:      Jolyn Lent 2013/06/28, 10:17 AM  Jolyn Lent, PT, DPT Acute Rehabilitation Services Pager: [phone]

## 2013-06-23 ENCOUNTER — Telehealth: Payer: Self-pay | Admitting: Cardiology

## 2013-06-23 LAB — BASIC METABOLIC PANEL
BUN: 19 mg/dL (ref 6–23)
CO2: 29 mEq/L (ref 19–32)
Calcium: 9.1 mg/dL (ref 8.4–10.5)
Chloride: 97 mEq/L (ref 96–112)
Creatinine, Ser: 0.96 mg/dL (ref 0.50–1.10)
GFR calc non Af Amer: 51 mL/min — ABNORMAL LOW (ref >90)
GFR, EST AFRICAN AMERICAN: 59 mL/min — AB (ref >90)
Glucose, Bld: 129 mg/dL — ABNORMAL HIGH (ref 70–99)
POTASSIUM: 3.4 meq/L — AB (ref 3.7–5.3)
SODIUM: 139 meq/L (ref 137–147)

## 2013-06-23 LAB — CBC
HCT: 23.1 % — ABNORMAL LOW (ref 36.0–46.0)
HEMOGLOBIN: 7.7 g/dL — AB (ref 12.0–15.0)
MCH: 29.4 pg (ref 26.0–34.0)
MCHC: 33.3 g/dL (ref 30.0–36.0)
MCV: 88.2 fL (ref 78.0–100.0)
Platelets: 195 10*3/uL (ref 150–400)
RBC: 2.62 MIL/uL — ABNORMAL LOW (ref 3.87–5.11)
RDW: 16 % — ABNORMAL HIGH (ref 11.5–15.5)
WBC: 5.4 10*3/uL (ref 4.0–10.5)

## 2013-06-23 MED ORDER — FUROSEMIDE 20 MG PO TABS: 20.0000 mg | ORAL_TABLET | Freq: Every day | ORAL | Status: DC

## 2013-06-23 MED ORDER — FERROUS SULFATE 325 (65 FE) MG PO TABS: 325.0000 mg | ORAL_TABLET | Freq: Three times a day (TID) | ORAL | Status: DC

## 2013-06-23 MED ORDER — LEVOFLOXACIN 750 MG PO TABS: 750.0000 mg | ORAL_TABLET | ORAL | Status: DC

## 2013-06-23 NOTE — Telephone Encounter (Signed)
Scheduled follow up appointment and gave to [place] at the hospital

## 2013-06-23 NOTE — Discharge Summary (Addendum)
Physician Discharge Summary  Anna Elliott OJJ:[id number] DOB: 08/26/1921 DOA: 06/18/2013  PCP: Sherrie Mustache, MD  Admit date: 06/18/2013 Discharge date: 06/23/2013  Time spent: 35 minutes  Recommendations for Outpatient Follow-up:  1. Follow up with cardiology in 1 week. Check a CBC in 1 week.  BNP    Component Value Date/Time   PROBNP 2094.0* 06/19/2013 0220   Filed Weights   06/21/13 0602 06/22/13 0528 06/23/13 9371  Weight: 57.97 kg (127 lb 12.8 oz) 57.153 kg (126 lb) 56.518 kg (124 lb 9.6 oz)     Discharge Diagnoses:  Principal Problem:   Pneumonia Active Problems:   HYPERTENSION   Moderate aortic stenosis   Atrial fibrillation   Discharge Condition: Guarded  Diet recommendation: low sodium diet   History of present illness:  80 y.o. female with history of atrial fibrillation was recently admitted for A. fib with RVR and as per patient's daughter patient was originally scheduled for cardioversion later tomorrow was brought to the ER patient has been having shortness of breath with throat congestion wheezing nonproductive cough over the last 2 days. Patient has been feeling weak and subjective feeling of fever and chills. In the ER chest x-ray was showing possible pneumonia and patient was mildly febrile. Patient otherwise denies any nausea vomiting abdominal pain diarrhea or chest pain. Patient has been admitted for health care associated pneumonia. Patient was recently placed on amiodarone last week. Since patient's symptoms started patient's family had called cardiologist and was told to discontinue amiodarone. Patient took extra dose of Lasix suspecting her symptom may be from CHF.   Hospital Course:  HCAP:  - Started on vanc and cefepime on admission, de-escalated to levaquin.  Cont Levaquin for 2 additional days. - Continue PRN nebulizer and schedule pulmicort  - Daughter  Demanded a CT of the chest:Small BILATERAL pleural effusions and bibasilar  atelectasis.  Patchy airspace infiltrates in both upper lobes most likely representing pneumonia. Nonspecific minimally enlarged precarinal lymph node.    Atrial fibrillation presently rate controlled:  - started on diltiazem drip, changed to oral. - Consulted cardiology.- Plan is to continue cardizem, hold eliquis due to her anemia, to follow with Dr. Mare Ferrari. - Amiodarone discontinued as per daughter request.   Anemia:  - Patient with hx of AOCD with mild iron deficiency component  - Low but stable, pending CBC.  - Will monitor Hgb trend.  Chronic diastolic HF:  - Last EF measured was last week 65-70%.  - Cont lasix.  Hypertension:  - continue present medications. BP stable.  Moderate aortic stenosis: per cardiology.   Procedures:  Ct chest as below.  Consultations:  cardiology  Discharge Exam: Filed Vitals:   06/23/13 0623  BP: 136/49  Pulse: 71  Temp: 97.9 F (36.6 C)  Resp: 18    General: A&O x3 Cardiovascular: RRR Respiratory: good air movement CTA B/L  Discharge Instructions      Discharge Instructions   Diet - low sodium heart healthy    Complete by:  As directed      Increase activity slowly    Complete by:  As directed             Medication List    STOP taking these medications       amiodarone 200 MG tablet  Commonly known as:  PACERONE     apixaban 2.5 MG Tabs tablet  Commonly known as:  ELIQUIS     diphenhydrAMINE 25 MG tablet  Commonly known as:  BENADRYL  TAKE these medications       CALCIUM + D PO  Take 2 tablets by mouth 2 (two) times daily.     cyanocobalamin 500 MCG tablet  Take 500 mcg by mouth daily.     diltiazem 60 MG tablet  Commonly known as:  CARDIZEM  Take 1 tablet (60 mg total) by mouth 3 (three) times daily.     EYE VITAMINS PO  Take 1 tablet by mouth daily.     ferrous sulfate 325 (65 FE) MG tablet  Take 1 tablet (325 mg total) by mouth 3 (three) times daily with meals.     furosemide 20 MG  tablet  Commonly known as:  LASIX  Take 1 tablet (20 mg total) by mouth daily.     lansoprazole 30 MG disintegrating tablet  Commonly known as:  PREVACID SOLUTAB  Take 30 mg by mouth daily.     levofloxacin 750 MG tablet  Commonly known as:  LEVAQUIN  Take 1 tablet (750 mg total) by mouth every other day.     zolpidem 10 MG tablet  Commonly known as:  AMBIEN  Take 5 mg by mouth at bedtime.       Allergies  Allergen Reactions  . Asa [Aspirin] Other (See Comments)    Acid reflux  . Codeine Other (See Comments)    Makes her sick  . Potassium Chloride     Loss of appetite  . Sulfonamide Derivatives Itching and Rash   Follow-up Information   Follow up with Kirk Ruths, MD In 1 week. (hospital follow up)    Specialty:  Cardiology   Contact information:   [address] [place] [address] [phone]        The results of significant diagnostics from this hospitalization (including imaging, microbiology, ancillary and laboratory) are listed below for reference.    Significant Diagnostic Studies: Dg Chest 2 View  06/13/2013   CLINICAL DATA:  80 year old female with tachycardia. Initial encounter.  EXAM: CHEST  2 VIEW  COMPARISON:  Portable chest radiograph 07/03/2012.  FINDINGS: Interval resolved small left pleural effusion. Stable lung volumes. Stable cardiomegaly and mediastinal contours. No pneumothorax or pulmonary edema. No consolidation or confluent pulmonary opacity. Multilevel mild compression fractures in the thoracic spine. Osteopenia. Mild scoliosis. Calcified atherosclerosis, including involvement of the aorta.  IMPRESSION: Stable cardiomegaly. No acute cardiopulmonary abnormality.   Electronically Signed   By: Lars Pinks M.D.   On: 06/13/2013 11:17   Ct Chest Wo Contrast  06/22/2013   CLINICAL DATA:  Shortness of breath, evaluate parenchyma, history hypertension, atrial fibrillation, chronic diastolic heart failure  EXAM: CT CHEST WITHOUT CONTRAST   TECHNIQUE: Multidetector CT imaging of the chest was performed following the standard protocol without IV contrast. Sagittal and coronal MPR images reconstructed from axial data set.  COMPARISON:  Chest radiograph 06/19/2013  FINDINGS: Atherosclerotic calcifications aorta and coronary arteries.  Question mild splenic enlargement.  Minimally enlarged precarinal lymph node 12 mm short axis image 23.  Additional scattered normal-sized mediastinal nodes without additional adenopathy.  Small BILATERAL pleural effusions.  Mitral annular and aortic valvular calcifications noted.  Patchy airspace infiltrates are identified in both upper lobes question infection.  Dependent atelectasis in both lower lobes.  No pneumothorax or discrete pulmonary mass.  Diffuse osseous demineralization with probable vertebral hemangioma at T3 vertebral body.  IMPRESSION: Small BILATERAL pleural effusions and bibasilar atelectasis.  Patchy airspace infiltrates in both upper lobes most likely representing pneumonia.  Nonspecific minimally enlarged  precarinal lymph node.  Extensive atherosclerotic disease.  Question splenomegaly.   Electronically Signed   By: Lavonia Dana M.D.   On: 06/22/2013 02:57   Dg Chest Port 1 View  06/19/2013   CLINICAL DATA:  Shortness of breath.  EXAM: PORTABLE CHEST - 1 VIEW  COMPARISON:  Chest radiograph performed 06/13/2013  FINDINGS: The lungs are well-aerated. Mild bibasilar airspace opacities may reflect atelectasis or mild pneumonia. Mild peribronchial thickening is again seen. There is no evidence of pleural effusion or pneumothorax.  The cardiomediastinal silhouette is borderline enlarged. No acute osseous abnormalities are seen.  IMPRESSION: Mild bibasilar airspace opacities may reflect atelectasis or mild pneumonia. Mild peribronchial thickening seen. Borderline cardiomegaly.   Electronically Signed   By: Garald Balding M.D.   On: 06/19/2013 03:16    Microbiology: Recent Results (from the past 240  hour(s))  URINE CULTURE     Status: None   Collection Time    06/13/13 12:17 PM      Result Value Ref Range Status   Specimen Description URINE, CLEAN CATCH   Final   Special Requests NONE   Final   Culture  Setup Time     Final   Value: 06/13/2013 16:30     Performed at [facility] Count     Final   Value: >=100,000 COLONIES/ML     Performed at [facility]   Culture     Final   Value: Multiple bacterial morphotypes present, none predominant. Suggest appropriate recollection if clinically indicated.     Performed at [facility]   Report Status 06/14/2013 FINAL   Final  CULTURE, BLOOD (ROUTINE X 2)     Status: None   Collection Time    06/19/13  2:38 AM      Result Value Ref Range Status   Specimen Description BLOOD RIGHT ANTECUBITAL   Final   Special Requests BOTTLES DRAWN AEROBIC AND ANAEROBIC 10CC EA   Final   Culture  Setup Time     Final   Value: 06/19/2013 09:09     Performed at [facility]   Culture     Final   Value:        BLOOD CULTURE RECEIVED NO GROWTH TO DATE CULTURE WILL BE HELD FOR 5 DAYS BEFORE ISSUING A FINAL NEGATIVE REPORT     Performed at [facility]   Report Status PENDING   Incomplete  CULTURE, BLOOD (ROUTINE X 2)     Status: None   Collection Time    06/19/13  2:44 AM      Result Value Ref Range Status   Specimen Description BLOOD RIGHT HAND   Final   Special Requests BOTTLES DRAWN AEROBIC ONLY 5CC   Final   Culture  Setup Time     Final   Value: 06/19/2013 09:09     Performed at [facility]   Culture     Final   Value:        BLOOD CULTURE RECEIVED NO GROWTH TO DATE CULTURE WILL BE HELD FOR 5 DAYS BEFORE ISSUING A FINAL NEGATIVE REPORT     Performed at [facility]   Report Status PENDING   Incomplete     Labs: Basic Metabolic Panel:  Recent Labs Lab 06/19/13 0144 06/19/13 0805 06/20/13 0520 06/21/13 0537  NA 144 140 136* 140  K 3.7 3.7 3.8 3.8  CL 103 101 99 101  CO2  28 26 28  26  GLUCOSE 111* 133* 113* 121*  BUN 21 20 20 21   CREATININE 0.94 0.89 0.91 1.02  CALCIUM 9.0 8.4 8.7 8.5   Liver Function Tests:  Recent Labs Lab 06/19/13 0144 06/19/13 0805  AST 23 15  ALT 18 15  ALKPHOS 87 70  BILITOT 1.3* 1.9*  PROT 6.8 5.6*  ALBUMIN 4.0 3.2*   No results found for this basename: LIPASE, AMYLASE,  in the last 168 hours No results found for this basename: AMMONIA,  in the last 168 hours CBC:  Recent Labs Lab 06/19/13 0144 06/19/13 0855 06/19/13 1543 06/20/13 0520 06/21/13 0537 06/23/13 0545  WBC 10.8* 9.3 10.7* 8.1 6.9 5.4  NEUTROABS 9.3* 8.1*  --   --   --   --   HGB 10.1* 8.3* 8.8* 8.0* 7.6* 7.7*  HCT 30.8* 25.1* 26.1* 24.1* 22.8* 23.1*  MCV 89.8 90.0 88.5 89.6 89.1 88.2  PLT 188 144* 160 147* 152 195   Cardiac Enzymes: No results found for this basename: CKTOTAL, CKMB, CKMBINDEX, TROPONINI,  in the last 168 hours BNP: BNP (last 3 results)  Recent Labs  07/03/12 1200 06/13/13 1125 06/19/13 0220  PROBNP 3694.0* 4974.0* 2094.0*   CBG: No results found for this basename: GLUCAP,  in the last 168 hours     Signed:  Charlynne Cousins  [facility] 06/23/2013, 8:03 AM

## 2013-06-23 NOTE — Telephone Encounter (Signed)
New Message:  Pt wants a f/u appt in one week. Next available for [place]/PA/NP is the end of June. Requests a call back to be worked in post hospital

## 2013-06-25 LAB — CULTURE, BLOOD (ROUTINE X 2)
Culture: NO GROWTH
Culture: NO GROWTH

## 2013-06-30 ENCOUNTER — Ambulatory Visit (INDEPENDENT_AMBULATORY_CARE_PROVIDER_SITE_OTHER): Payer: Medicare Other | Admitting: Cardiology

## 2013-06-30 ENCOUNTER — Telehealth: Payer: Self-pay | Admitting: *Deleted

## 2013-06-30 ENCOUNTER — Encounter (INDEPENDENT_AMBULATORY_CARE_PROVIDER_SITE_OTHER): Payer: Self-pay

## 2013-06-30 ENCOUNTER — Encounter: Payer: Self-pay | Admitting: Cardiology

## 2013-06-30 VITALS — BP 142/65 | HR 78 | Temp 97.0°F | Ht 65.0 in | Wt 124.0 lb

## 2013-06-30 DIAGNOSIS — I5033 Acute on chronic diastolic (congestive) heart failure: Secondary | ICD-10-CM

## 2013-06-30 DIAGNOSIS — I4891 Unspecified atrial fibrillation: Secondary | ICD-10-CM

## 2013-06-30 DIAGNOSIS — I350 Nonrheumatic aortic (valve) stenosis: Secondary | ICD-10-CM

## 2013-06-30 DIAGNOSIS — I509 Heart failure, unspecified: Secondary | ICD-10-CM

## 2013-06-30 DIAGNOSIS — I480 Paroxysmal atrial fibrillation: Secondary | ICD-10-CM

## 2013-06-30 DIAGNOSIS — I359 Nonrheumatic aortic valve disorder, unspecified: Secondary | ICD-10-CM

## 2013-06-30 DIAGNOSIS — I5031 Acute diastolic (congestive) heart failure: Secondary | ICD-10-CM

## 2013-06-30 DIAGNOSIS — D509 Iron deficiency anemia, unspecified: Secondary | ICD-10-CM

## 2013-06-30 DIAGNOSIS — D649 Anemia, unspecified: Secondary | ICD-10-CM

## 2013-06-30 LAB — CBC WITH DIFFERENTIAL/PLATELET
BASOS ABS: 0 10*3/uL (ref 0.0–0.1)
Basophils Relative: 0.2 % (ref 0.0–3.0)
Eosinophils Absolute: 0.1 10*3/uL (ref 0.0–0.7)
Eosinophils Relative: 1.1 % (ref 0.0–5.0)
Hemoglobin: 8.8 g/dL — ABNORMAL LOW (ref 12.0–15.0)
LYMPHS ABS: 0.8 10*3/uL (ref 0.7–4.0)
Lymphocytes Relative: 13.8 % (ref 12.0–46.0)
MCHC: 33.1 g/dL (ref 30.0–36.0)
MCV: 86.5 fl (ref 78.0–100.0)
MONO ABS: 0.5 10*3/uL (ref 0.1–1.0)
Monocytes Relative: 8.8 % (ref 3.0–12.0)
NEUTROS PCT: 76.1 % (ref 43.0–77.0)
Neutro Abs: 4.3 10*3/uL (ref 1.4–7.7)
PLATELETS: 330 10*3/uL (ref 150.0–400.0)
RBC: 3.09 Mil/uL — ABNORMAL LOW (ref 3.87–5.11)
RDW: 17.6 % — AB (ref 11.5–15.5)
WBC: 5.7 10*3/uL (ref 4.0–10.5)

## 2013-06-30 NOTE — Assessment & Plan Note (Signed)
The patient still complains of weakness.  She is anxious to know if her hemoglobin has improved.  Hemoglobin when she was discharged from the hospital was 7.6.  She was hopeful not to have to have a transfusion if iron therapy could help instead.  She has a past history of anemia and has been followed at the [facility] and by Dr. Edrick Oh.

## 2013-06-30 NOTE — Progress Notes (Signed)
Anna Elliott Date of Birth:  1921/10/01 [facility] [address] [place], [place]  [address] [phone]        Fax   [phone]   History of Present Illness: This pleasant 80 year old woman is seen for a post hospital office visit. She has a history of atrial fibrillation with rapid ventricular response.  She was admitted in June 2014. She was admitted at that time with atrial fibrillation with rapid ventricular response. She was also noted to have known moderate aortic stenosis. She had evidence of acute on chronic diastolic congestive heart failure. She has a history of hypertension. On 07/04/12 she underwent a TEE cardioversion. She converted to normal sinus rhythm with 200 J synchronized biphasic energy. She was discharged the next day maintaining sinus rhythm with first degree AV block.  She did well until May 2015 when she went back into atrial fibrillation with rapid ventricular response.  During her recent hospitalization her hemoglobin was noted to be dropping and her anticoagulation was temporarily stopped.-the patient remains in sinus rhythm on exam. Continue present dose of Cardizem. Amiodarone discontinued at daughter's request as she felt it was contributing to pulmonary issues. Her hemoglobin is low. There are no signs of active bleeding but I'm hesitant to continue apixaban; discontinued 5/27. She has an allergy to aspirin. We will hold anticoagulation until it is clear hemoglobin is stable.  Her most recent echocardiogram on 06/14/13 reveals: - Left ventricle: The cavity size was normal. There was mild focal basal hypertrophy of the septum. Systolic function was vigorous. The estimated ejection fraction was in the range of 65% to 70%. Wall motion was normal; there were no regional wall motion abnormalities. The study is not technically sufficient to allow evaluation of LV diastolic function. - Aortic valve: There was moderate stenosis. Valve area (VTI):  0.71 cm^2. Valve area (Vmax): 0.78 cm^2. - Mitral valve: Moderately to severely calcified annulus. Moderately thickened, mildly calcified leaflets . Mild, late systolicprolapse, involving the posterior leaflet. There was mild regurgitation. Valve area by continuity equation (using LVOT flow): 2.0 cm^2. - Left atrium: The atrium was moderately to severely dilated. - Tricuspid valve: There was moderate regurgitation. - Pulmonary arteries: PA peak pressure: 43 mm Hg (S).  since discharge from the hospital last week, the patient has finished her antibiotic.  She is not running any fever.  She still feels weak.  Her bowel movements have improved and she attributes that to being off of apixaban.   Current Outpatient Prescriptions  Medication Sig Dispense Refill  . Calcium Carbonate-Vitamin D (CALCIUM + D PO) Take 2 tablets by mouth 2 (two) times daily.      . cyanocobalamin 500 MCG tablet Take 500 mcg by mouth daily.      Marland Kitchen diltiazem (CARDIZEM) 60 MG tablet Take 1 tablet (60 mg total) by mouth 3 (three) times daily.  90 tablet  5  . ferrous sulfate 325 (65 FE) MG tablet Take 1 tablet (325 mg total) by mouth 3 (three) times daily with meals.  90 tablet  3  . furosemide (LASIX) 20 MG tablet Take 1 tablet (20 mg total) by mouth daily.  30 tablet  0  . lansoprazole (PREVACID SOLUTAB) 30 MG disintegrating tablet Take 30 mg by mouth daily.      . Multiple Vitamins-Minerals (EYE VITAMINS PO) Take 1 tablet by mouth daily.      Marland Kitchen zolpidem (AMBIEN) 10 MG tablet Take 5 mg by mouth at bedtime.       Marland Kitchen  levofloxacin (LEVAQUIN) 750 MG tablet Take 1 tablet (750 mg total) by mouth every other day.  2 tablet  0   No current facility-administered medications for this visit.    Allergies  Allergen Reactions  . Amiodarone     Possible pulmonary reaction  . Asa [Aspirin] Other (See Comments)    Acid reflux  . Codeine Other (See Comments)    Makes her sick  . Potassium Chloride     Loss of appetite  .  Sulfonamide Derivatives Itching and Rash    Patient Active Problem List   Diagnosis Date Noted  . Pneumonia 06/19/2013  . Anemia 07/21/2012  . Black stools 07/21/2012  . Atrial fibrillation with rapid ventricular response 07/05/2012  . Anticoagulated with Eliquis 07/05/2012  . Acute on chronic diastolic CHF (congestive heart failure), NYHA class 3 07/05/2012  . Acute diastolic heart failure   . Atrial fibrillation 06/22/2012  . Moderate aortic stenosis   . CHANGE IN BOWELS 11/14/2008  . FLATULENCE ERUCTATION AND GAS PAIN 10/29/2008  . DIARRHEA 10/29/2008  . ANEMIA, CHRONIC 05/14/2007  . HYPERTENSION 05/14/2007  . GERD 05/14/2007  . OTHER DYSPHAGIA 05/14/2007  . HIATAL HERNIA 09/10/2005  . DIVERTICULOSIS, COLON 08/02/1998  . ESOPHAGITIS 10/01/1987    History  Smoking status  . Never Smoker   Smokeless tobacco  . Never Used    History  Alcohol Use No    Family History  Problem Relation Age of Onset  . Heart failure Father   . Heart disease Brother   . Diabetes Father   . Colon cancer Brother   . Lung cancer Sister   . Lymphoma Sister     Review of Systems: Constitutional: no fever chills diaphoresis or fatigue or change in weight.  Head and neck: no hearing loss, no epistaxis, no photophobia or visual disturbance. Respiratory: No cough, shortness of breath or wheezing. Cardiovascular: No chest pain peripheral edema, palpitations. Gastrointestinal: No abdominal distention, no abdominal pain, no change in bowel habits hematochezia or melena. Genitourinary: No dysuria, no frequency, no urgency, no nocturia. Musculoskeletal:No arthralgias, no back pain, no gait disturbance or myalgias. Neurological: No dizziness, no headaches, no numbness, no seizures, no syncope, no weakness, no tremors. Hematologic: No lymphadenopathy, no easy bruising. Psychiatric: No confusion, no hallucinations, no sleep disturbance.    Physical Exam: Filed Vitals:   06/30/13 0806  BP:  142/65  Pulse: 78   the general appearance reveals an elderly woman in no acute distress.  She is slightly pale.The head and neck exam reveals pupils equal and reactive.  Extraocular movements are full.  There is no scleral icterus.  The mouth and pharynx are normal.  The neck is supple.  The carotids reveal no bruits.  The jugular venous pressure is normal.  The  thyroid is not enlarged.  There is no lymphadenopathy.  The chest is clear to percussion and auscultation.  There are no rales or rhonchi.  Expansion of the chest is symmetrical.  The precordium is quiet.  The first heart sound is normal.  The second heart sound is physiologically split.  There is a grade 3/6 harsh systolic ejection murmur over the aortic valve and a soft diastolic aortic murmur of aortic insufficiency. There is no abnormal lift or heave.  The abdomen is soft and nontender.  The bowel sounds are normal.  The liver and spleen are not enlarged.  There are no abdominal masses.  There are no abdominal bruits.  Extremities reveal good pedal pulses.  There  is no phlebitis or edema.  There is no cyanosis or clubbing.  Strength is normal and symmetrical in all extremities.  There is no lateralizing weakness.  There are no sensory deficits.  The skin is warm and dry.  There is no rash.     Assessment / Plan: 1. paroxysmal atrial fibrillation, holding normal sinus rhythm. 2. intolerance to multiple drugs including amiodarone which she feels gave her an acute pulmonary reaction. 3. because of the falling hemoglobin she is not presently on any anticoagulation despite her age fibrillation.  Patient refuses apixaban and she is allergic to aspirin. 4. chronic diastolic heart failure with ejection fraction 65-70%, stable on Lasix 5. essential hypertension 6. Anemia  Plan: She will remain off amiodarone.  This has been marked as an allergy.  She will remain off anticoagulation for now.  This is because of her worsening anemia.  CBC today is  pending.  She will followup with Dr. Edrick Oh in another week or so regarding her anemia.  She may require transfusion from time to time.  Recheck here in 3 months for office visit and EKG.

## 2013-06-30 NOTE — Assessment & Plan Note (Signed)
She appears to be euvolemic.  She is not having any significant exertional dyspnea

## 2013-06-30 NOTE — Telephone Encounter (Signed)
Advised daughter of results and sent to Dr Edrick Oh   Notes Recorded by Darlin Coco, MD on 06/30/2013 at 12:54 PM Please report. The hemoglobin has risen from 7.7 up to 8.8 which is a good increase in one week. Continue current medication. Suggest close followup of her hemoglobin with Dr. Edrick Oh. If her hemoglobin comes back toward normal and shows stability we will need to revisit the question of long-term anticoagulation for her paroxysmal atrial fib again.

## 2013-06-30 NOTE — Patient Instructions (Signed)
Will obtain labs today and call you with the results (cbc)  Your physician recommends that you continue on your current medications as directed. Please refer to the Current Medication list given to you today.  Your physician recommends that you schedule a follow-up appointment in: 3 month ov/ekg

## 2013-06-30 NOTE — Assessment & Plan Note (Signed)
Clinically the patient is maintaining normal sinus rhythm.  Heart rate is regular.

## 2013-09-29 ENCOUNTER — Other Ambulatory Visit: Payer: Medicare Other

## 2013-09-29 ENCOUNTER — Ambulatory Visit: Payer: Medicare Other

## 2013-10-06 ENCOUNTER — Encounter: Payer: Self-pay | Admitting: Cardiology

## 2013-10-06 ENCOUNTER — Ambulatory Visit (INDEPENDENT_AMBULATORY_CARE_PROVIDER_SITE_OTHER): Payer: Medicare Other | Admitting: Cardiology

## 2013-10-06 VITALS — BP 146/60 | HR 73 | Ht 65.0 in | Wt 130.1 lb

## 2013-10-06 DIAGNOSIS — D539 Nutritional anemia, unspecified: Secondary | ICD-10-CM

## 2013-10-06 DIAGNOSIS — I509 Heart failure, unspecified: Secondary | ICD-10-CM

## 2013-10-06 DIAGNOSIS — I350 Nonrheumatic aortic (valve) stenosis: Secondary | ICD-10-CM

## 2013-10-06 DIAGNOSIS — D509 Iron deficiency anemia, unspecified: Secondary | ICD-10-CM

## 2013-10-06 DIAGNOSIS — I5033 Acute on chronic diastolic (congestive) heart failure: Secondary | ICD-10-CM

## 2013-10-06 DIAGNOSIS — I480 Paroxysmal atrial fibrillation: Secondary | ICD-10-CM

## 2013-10-06 DIAGNOSIS — I4891 Unspecified atrial fibrillation: Secondary | ICD-10-CM

## 2013-10-06 DIAGNOSIS — I359 Nonrheumatic aortic valve disorder, unspecified: Secondary | ICD-10-CM

## 2013-10-06 DIAGNOSIS — I10 Essential (primary) hypertension: Secondary | ICD-10-CM

## 2013-10-06 NOTE — Patient Instructions (Signed)
Your physician recommends that you continue on your current medications as directed. Please refer to the Current Medication list given to you today.  Your physician wants you to follow-up in: 4 month ov/ekg  You will receive a reminder letter in the mail two months in advance. If you don't receive a letter, please call our office to schedule the follow-up appointment.  

## 2013-10-06 NOTE — Assessment & Plan Note (Signed)
She is not currently taking iron.  She is scheduled to have her hemoglobin checked at Dr. Murrell Redden office next week

## 2013-10-06 NOTE — Assessment & Plan Note (Signed)
She denies any cardinal symptoms of severe aortic stenosis such as exertional dizziness or syncope, chest pain, or increasing dyspnea

## 2013-10-06 NOTE — Assessment & Plan Note (Signed)
Blood pressure is remaining stable on current therapy.  No headaches.  No dizzy spells or syncope.

## 2013-10-06 NOTE — Progress Notes (Signed)
Anna Elliott Date of Birth:  Nov 17, 1921 [facility] [address] [place], [place]  [address] [phone]        Fax   [phone]   History of Present Illness: This pleasant 80 year old woman is seen for a scheduled followup office visit. She has a history of atrial fibrillation with rapid ventricular response.  She was admitted in June 2014. She was admitted at that time with atrial fibrillation with rapid ventricular response. She was also noted to have known moderate aortic stenosis. She had evidence of acute on chronic diastolic congestive heart failure. She has a history of hypertension. On 07/04/12 she underwent a TEE cardioversion. She converted to normal sinus rhythm with 200 J synchronized biphasic energy. She was discharged the next day maintaining sinus rhythm with first degree AV block.  She did well until May 2015 when she went back into atrial fibrillation with rapid ventricular response.  During her recent hospitalization her hemoglobin was noted to be dropping and her anticoagulation was temporarily stopped.-the patient remains in sinus rhythm on exam. Continue present dose of Cardizem. Amiodarone discontinued at daughter's request as she felt it was contributing to pulmonary issues. Her hemoglobin is low. There are no signs of active bleeding but  hesitant to continue apixaban; discontinued 5/27. She has an allergy to aspirin. We will hold anticoagulation until it is clear hemoglobin is stable.  Her most recent echocardiogram on 06/14/13 reveals: - Left ventricle: The cavity size was normal. There was mild focal basal hypertrophy of the septum. Systolic function was vigorous. The estimated ejection fraction was in the range of 65% to 70%. Wall motion was normal; there were no regional wall motion abnormalities. The study is not technically sufficient to allow evaluation of LV diastolic function. - Aortic valve: There was moderate stenosis. Valve area (VTI):  0.71 cm^2. Valve area (Vmax): 0.78 cm^2. - Mitral valve: Moderately to severely calcified annulus. Moderately thickened, mildly calcified leaflets . Mild, late systolicprolapse, involving the posterior leaflet. There was mild regurgitation. Valve area by continuity equation (using LVOT flow): 2.0 cm^2. - Left atrium: The atrium was moderately to severely dilated. - Tricuspid valve: There was moderate regurgitation. - Pulmonary arteries: PA peak pressure: 43 mm Hg (S).  since last visit the patient has had no recurrence of atrial flutter fibrillation.  She has remained in normal sinus rhythm.  She is off amiodarone and off anticoagulation.  Current Outpatient Prescriptions  Medication Sig Dispense Refill  . cyanocobalamin 500 MCG tablet Take 500 mcg by mouth daily.      Marland Kitchen diltiazem (CARDIZEM) 60 MG tablet Take 1 tablet (60 mg total) by mouth 3 (three) times daily.  90 tablet  5  . furosemide (LASIX) 20 MG tablet Take 1 tablet (20 mg total) by mouth daily.  30 tablet  0  . lansoprazole (PREVACID SOLUTAB) 30 MG disintegrating tablet Take 30 mg by mouth daily.      . Multiple Vitamins-Minerals (EYE VITAMINS PO) Take 1 tablet by mouth daily.      Marland Kitchen zolpidem (AMBIEN) 10 MG tablet Take 5 mg by mouth at bedtime.        No current facility-administered medications for this visit.    Allergies  Allergen Reactions  . Codeine Other (See Comments) and Nausea And Vomiting    Makes her sick  . Potassium Chloride     Loss of appetite  . Amiodarone Rash    Pulmonary distress Possible pulmonary reaction  . Asa [Aspirin]  Other (See Comments) and Rash    Acid reflux  . Ramipril Rash  . Sulfa Antibiotics Rash  . Sulfonamide Derivatives Itching and Rash    Patient Active Problem List   Diagnosis Date Noted  . Pneumonia 06/19/2013  . Anemia 07/21/2012  . Black stools 07/21/2012  . Atrial fibrillation with rapid ventricular response 07/05/2012  . Anticoagulated with Eliquis 07/05/2012  . Acute  on chronic diastolic CHF (congestive heart failure), NYHA class 3 07/05/2012  . Acute diastolic heart failure   . Atrial fibrillation 06/22/2012  . Moderate aortic stenosis   . CHANGE IN BOWELS 11/14/2008  . FLATULENCE ERUCTATION AND GAS PAIN 10/29/2008  . DIARRHEA 10/29/2008  . ANEMIA, CHRONIC 05/14/2007  . HYPERTENSION 05/14/2007  . GERD 05/14/2007  . OTHER DYSPHAGIA 05/14/2007  . HIATAL HERNIA 09/10/2005  . DIVERTICULOSIS, COLON 08/02/1998  . ESOPHAGITIS 10/01/1987    History  Smoking status  . Never Smoker   Smokeless tobacco  . Never Used    History  Alcohol Use No    Family History  Problem Relation Age of Onset  . Heart failure Father   . Heart disease Brother   . Diabetes Father   . Colon cancer Brother   . Lung cancer Sister   . Lymphoma Sister     Review of Systems: Constitutional: no fever chills diaphoresis or fatigue or change in weight.  Head and neck: no hearing loss, no epistaxis, no photophobia or visual disturbance. Respiratory: No cough, shortness of breath or wheezing. Cardiovascular: No chest pain peripheral edema, palpitations. Gastrointestinal: No abdominal distention, no abdominal pain, no change in bowel habits hematochezia or melena. Genitourinary: No dysuria, no frequency, no urgency, no nocturia. Musculoskeletal:No arthralgias, no back pain, no gait disturbance or myalgias. Neurological: No dizziness, no headaches, no numbness, no seizures, no syncope, no weakness, no tremors. Hematologic: No lymphadenopathy, no easy bruising. Psychiatric: No confusion, no hallucinations, no sleep disturbance.    Physical Exam: Filed Vitals:   10/06/13 1420  BP: 146/60  Pulse: 73   the general appearance reveals an elderly woman in no acute distress.  She is slightly pale.The head and neck exam reveals pupils equal and reactive.  Extraocular movements are full.  There is no scleral icterus.  The mouth and pharynx are normal.  The neck is supple.  The  carotids reveal no bruits.  The jugular venous pressure is normal.  The  thyroid is not enlarged.  There is no lymphadenopathy.  The chest is clear to percussion and auscultation.  There are no rales or rhonchi.  Expansion of the chest is symmetrical.  The precordium is quiet.  The first heart sound is normal.  The second heart sound is physiologically split.  There is a grade 3/6 harsh systolic ejection murmur over the aortic valve and a soft diastolic aortic murmur of aortic insufficiency. There is no abnormal lift or heave.  The abdomen is soft and nontender.  The bowel sounds are normal.  The liver and spleen are not enlarged.  There are no abdominal masses.  There are no abdominal bruits.  Extremities reveal good pedal pulses.  There is no phlebitis or edema.  There is no cyanosis or clubbing.  Strength is normal and symmetrical in all extremities.  There is no lateralizing weakness.  There are no sensory deficits.  The skin is warm and dry.  There is no rash.   EKG shows normal sinus rhythm with first degree AV block.  Assessment / Plan: 1. paroxysmal  atrial fibrillation, holding normal sinus rhythm. 2. intolerance to multiple drugs including amiodarone which she feels gave her an acute pulmonary reaction. 3. because of the falling hemoglobin she is not presently on any anticoagulation despite her history of atrial fibrillation.  Patient refuses apixaban and she is allergic to aspirin. 4. chronic diastolic heart failure with ejection fraction 65-70%, stable on Lasix 5. essential hypertension 6. Anemia  Plan: She will remain off amiodarone.  This has been marked as an allergy.  She will remain off anticoagulation.  She will have blood work at Dr. Murrell Redden office next week Overall she looks very good today.  No change in meds.  Recheck in 4 months for office visit and EKG

## 2013-10-06 NOTE — Assessment & Plan Note (Signed)
She's had no recurrence of atrial fibrillation

## 2013-10-12 ENCOUNTER — Telehealth: Payer: Self-pay | Admitting: Hematology

## 2013-10-12 NOTE — Telephone Encounter (Signed)
S/w pt and pt's daughter advised appt chg on 10/30 due to md meeting. Pt req new appt on 10/29 Gave appt 10/29 @ 2.30p.

## 2013-11-23 ENCOUNTER — Other Ambulatory Visit (HOSPITAL_BASED_OUTPATIENT_CLINIC_OR_DEPARTMENT_OTHER): Payer: Medicare Other

## 2013-11-23 ENCOUNTER — Other Ambulatory Visit: Payer: Self-pay

## 2013-11-23 ENCOUNTER — Telehealth: Payer: Self-pay | Admitting: Hematology

## 2013-11-23 ENCOUNTER — Ambulatory Visit (HOSPITAL_BASED_OUTPATIENT_CLINIC_OR_DEPARTMENT_OTHER): Payer: Medicare Other | Admitting: Hematology

## 2013-11-23 VITALS — BP 145/63 | HR 77 | Temp 97.9°F | Resp 18 | Ht 65.0 in | Wt 129.5 lb

## 2013-11-23 DIAGNOSIS — D539 Nutritional anemia, unspecified: Secondary | ICD-10-CM

## 2013-11-23 DIAGNOSIS — D464 Refractory anemia, unspecified: Secondary | ICD-10-CM

## 2013-11-23 DIAGNOSIS — D509 Iron deficiency anemia, unspecified: Secondary | ICD-10-CM

## 2013-11-23 LAB — COMPREHENSIVE METABOLIC PANEL (CC13)
ALBUMIN: 4 g/dL (ref 3.5–5.0)
ALT: 7 U/L (ref 0–55)
ANION GAP: 7 meq/L (ref 3–11)
AST: 11 U/L (ref 5–34)
Alkaline Phosphatase: 76 U/L (ref 40–150)
BUN: 23.5 mg/dL (ref 7.0–26.0)
CO2: 27 meq/L (ref 22–29)
Calcium: 9 mg/dL (ref 8.4–10.4)
Chloride: 107 mEq/L (ref 98–109)
Creatinine: 1 mg/dL (ref 0.6–1.1)
Glucose: 122 mg/dl (ref 70–140)
POTASSIUM: 3.9 meq/L (ref 3.5–5.1)
SODIUM: 141 meq/L (ref 136–145)
TOTAL PROTEIN: 6.3 g/dL — AB (ref 6.4–8.3)
Total Bilirubin: 0.98 mg/dL (ref 0.20–1.20)

## 2013-11-23 LAB — CBC WITH DIFFERENTIAL/PLATELET
BASO%: 0.3 % (ref 0.0–2.0)
Basophils Absolute: 0 10*3/uL (ref 0.0–0.1)
EOS%: 0.8 % (ref 0.0–7.0)
Eosinophils Absolute: 0 10*3/uL (ref 0.0–0.5)
HCT: 32.2 % — ABNORMAL LOW (ref 34.8–46.6)
HGB: 10.5 g/dL — ABNORMAL LOW (ref 11.6–15.9)
LYMPH#: 0.7 10*3/uL — AB (ref 0.9–3.3)
LYMPH%: 12.3 % — ABNORMAL LOW (ref 14.0–49.7)
MCH: 29.3 pg (ref 25.1–34.0)
MCHC: 32.7 g/dL (ref 31.5–36.0)
MCV: 89.5 fL (ref 79.5–101.0)
MONO#: 0.4 10*3/uL (ref 0.1–0.9)
MONO%: 7.3 % (ref 0.0–14.0)
NEUT%: 79.3 % — ABNORMAL HIGH (ref 38.4–76.8)
NEUTROS ABS: 4.5 10*3/uL (ref 1.5–6.5)
Platelets: 193 10*3/uL (ref 145–400)
RBC: 3.59 10*6/uL — AB (ref 3.70–5.45)
RDW: 16 % — AB (ref 11.2–14.5)
WBC: 5.7 10*3/uL (ref 3.9–10.3)

## 2013-11-23 LAB — LACTATE DEHYDROGENASE (CC13): LDH: 144 U/L (ref 125–245)

## 2013-11-23 NOTE — Progress Notes (Signed)
Hematology and Oncology Follow Up Visit  Anna Elliott [id number] 28-Jun-1921 80 y.o. 11/23/2013 3:31 PM Sherrie Mustache, MD  Principle Diagnosis: Chronic anemia  Prior Therapy: Vitamin B12  Current therapy: Vitamin B12  Interim History: Patient is a pleasant 80 year female who comes for follow-up for her chronic anemia.  She was last seen by me Dr Juliann Mule on 05/24/2013. SheShe denies abdominal pain or fevers.  On December 30, her hemoglobin was 9.7 (her hgb baseline is 10) but dropped to 8.2 on 01/06.  Her FOBT was negative.  She denies symptoms of anemia such as dyspnea on exertion, palpitations.    Patient was admitted at [facility] from 06/18/2013 through 06/23/2013 and had significant anemia. She had atrial fibrillation with rapid ventricular rate, shortness of breath and a pneumonia. She is given IV antibiotics. A CT scan of the chest showed small bilateral pleural effusions and bibasilar atelectasis with patchy airspace infiltrates in both upper lobe suggestive of pneumonia. Cardiology services were consulted and they continued Cardizem but hold a liquids due to her anemia. Amiodarone was stopped. Her hemoglobin dropped to 7.6 g on 06/21/2013 but she was not transfused and was started on iron replacement therapy she did bounce back to 8.8 on 06/30/2013 and today she is 10.5 g. She denies any bleeding bruising. She does not consume enough fruits and vegetables. She has stopped taking iron. She does have a history of cardiac murmur since she had a rheumatic fever as a child.     Medications: I have reviewed the patient's current medications.  Current Outpatient Prescriptions  Medication Sig Dispense Refill  . cyanocobalamin 500 MCG tablet Take 500 mcg by mouth daily.      Marland Kitchen diltiazem (CARDIZEM) 60 MG tablet Take 1 tablet (60 mg total) by mouth 3 (three) times daily.  90 tablet  5  . furosemide (LASIX) 20 MG tablet Take 1 tablet (20 mg total) by mouth daily.  30 tablet  0  .  lansoprazole (PREVACID SOLUTAB) 30 MG disintegrating tablet Take 30 mg by mouth daily.      . Multiple Vitamins-Minerals (EYE VITAMINS PO) Take 1 tablet by mouth daily.      Marland Kitchen zolpidem (AMBIEN) 10 MG tablet Take 5 mg by mouth at bedtime.        No current facility-administered medications for this visit.     Allergies:  Allergies  Allergen Reactions  . Codeine Other (See Comments) and Nausea And Vomiting    Makes her sick  . Potassium Chloride     Loss of appetite  . Amiodarone Rash    Pulmonary distress Possible pulmonary reaction  . Asa [Aspirin] Other (See Comments) and Rash    Acid reflux  . Ramipril Rash  . Sulfa Antibiotics Rash  . Sulfonamide Derivatives Itching and Rash    Past Medical History, Surgical history, Social history, and Family History were reviewed and updated.  Review of Systems: Constitutional:  Negative for fever, chills, night sweats, anorexia, weight loss, pain. Cardiovascular: no chest pain or dyspnea on exertion Respiratory: no cough, shortness of breath, or wheezing Neurological: no TIA or stroke symptoms Dermatological: negative for rash ENT: negative for - epistaxis Skin: Negative. Gastrointestinal: no abdominal pain, change in bowel habits, or black or bloody stools Genito-Urinary: no dysuria, trouble voiding, or hematuria Hematological and Lymphatic: negative for - bleeding problems Breast: negative for breast lumps Musculoskeletal: negative for - gait disturbance Remaining ROS negative. Physical Exam: Blood pressure 145/63, pulse 77, temperature 97.9 F (36.6 C),  temperature source Oral, resp. rate 18, height $RemoveBe'5\' 5"'DjuPGeHiS$  (1.651 m), weight 129 lb 8 oz (58.741 kg), SpO2 98.00%. ECOG: 0 General appearance: alert, cooperative, appears stated age and no distress Head: Normocephalic, without obvious abnormality, atraumatic Neck: no adenopathy, supple, symmetrical, trachea midline and thyroid not enlarged, symmetric, no tenderness/mass/nodules Lymph  nodes: Cervical, supraclavicular, and axillary nodes normal. Heart:regular rate and rhythm, S1, S2 normal and systolic murmur: systolic ejection 2/6, harsh without radiation (patient reports a history of murmur) Lung:chest clear, no wheezing, rales, normal symmetric air entry, Heart exam - S1, S2 normal, no murmur, no gallop, rate regular Abdomin: soft, non-tender, without masses or organomegaly EXT:No peripheral edema   Lab Results: Lab Results  Component Value Date   WBC 5.7 11/23/2013   HGB 10.5* 11/23/2013   HCT 32.2* 11/23/2013   MCV 89.5 11/23/2013   PLT 193 11/23/2013     Chemistry      Component Value Date/Time   NA 141 11/23/2013 1452   NA 139 06/23/2013 1020   K 3.9 11/23/2013 1452   K 3.4* 06/23/2013 1020   CL 97 06/23/2013 1020   CO2 27 11/23/2013 1452   CO2 29 06/23/2013 1020   BUN 23.5 11/23/2013 1452   BUN 19 06/23/2013 1020   CREATININE 1.0 11/23/2013 1452   CREATININE 0.96 06/23/2013 1020      Component Value Date/Time   CALCIUM 9.0 11/23/2013 1452   CALCIUM 9.1 06/23/2013 1020   ALKPHOS 76 11/23/2013 1452   ALKPHOS 70 06/19/2013 0805   AST 11 11/23/2013 1452   AST 15 06/19/2013 0805   ALT 7 11/23/2013 1452   ALT 15 06/19/2013 0805   BILITOT 0.98 11/23/2013 1452   BILITOT 1.9* 06/19/2013 0805       Radiological Studies: No results found.   Impression and Plan:  1. Refractory anemia. -- History of refractory anemia dating back to 2002 when a bone marrow biopsy was performed. This did show evidence of perhaps early mild dysplasia and/or sideroblastic changes.  She has not required transfusions and has been stable over the years with a baseline hemoglobin of 10. I will continue to monitor her every three months. No intervention currently required.  Labs reviewed and hemogloblin stable.   2. Follow up. --patient should have labs and CMP in 3 months,.   All questions were answered. The patient knows to call the clinic with any problems, questions or  concerns. We can certainly see the patient much sooner if necessary.   I spent 15 minutes counseling the patient face to face. The total time spent in the appointment was 25 minutes.   Bernadene Bell, MD Medical Hematologist/Oncologist [place] Pager: [phone] Office No: [phone]

## 2013-11-23 NOTE — Telephone Encounter (Signed)
Gave avs & cal for Jan 2016. Pt wanted 77month visit but per pof we sch for Jan 2016.

## 2013-11-24 ENCOUNTER — Other Ambulatory Visit: Payer: Medicare Other

## 2013-11-24 ENCOUNTER — Ambulatory Visit: Payer: Medicare Other

## 2013-12-06 ENCOUNTER — Other Ambulatory Visit: Payer: Self-pay | Admitting: Physician Assistant

## 2014-02-04 ENCOUNTER — Other Ambulatory Visit: Payer: Self-pay | Admitting: Cardiology

## 2014-02-06 ENCOUNTER — Ambulatory Visit (INDEPENDENT_AMBULATORY_CARE_PROVIDER_SITE_OTHER): Payer: Medicare Other | Admitting: Cardiology

## 2014-02-06 ENCOUNTER — Encounter: Payer: Self-pay | Admitting: Cardiology

## 2014-02-06 VITALS — BP 142/78 | HR 82 | Ht 64.0 in | Wt 132.0 lb

## 2014-02-06 DIAGNOSIS — I5033 Acute on chronic diastolic (congestive) heart failure: Secondary | ICD-10-CM

## 2014-02-06 DIAGNOSIS — I10 Essential (primary) hypertension: Secondary | ICD-10-CM

## 2014-02-06 DIAGNOSIS — I480 Paroxysmal atrial fibrillation: Secondary | ICD-10-CM

## 2014-02-06 DIAGNOSIS — I350 Nonrheumatic aortic (valve) stenosis: Secondary | ICD-10-CM

## 2014-02-06 NOTE — Progress Notes (Signed)
Anna Elliott Date of Birth:  [birth year] [facility] [address] [place], [place]  [address] [phone]        Fax   [phone]   History of Present Illness: This pleasant 81 year old woman is seen for a scheduled followup office visit. She has a past history of atrial fibrillation with rapid ventricular response.  She was admitted in June 2014. She was admitted at that time with atrial fibrillation with rapid ventricular response. She was also noted to have known moderate aortic stenosis. She had evidence of acute on chronic diastolic congestive heart failure. She has a history of hypertension. On 07/04/12 she underwent a TEE cardioversion. She converted to normal sinus rhythm with 200 J synchronized biphasic energy. She was discharged the next day maintaining sinus rhythm with first degree AV block.  She did well until May 2015 when she went back into atrial fibrillation with rapid ventricular response.  During her recent hospitalization her hemoglobin was noted to be dropping and her anticoagulation was discontinued.  She is followed at the [facility] for anemia.  She is allergic to aspirin and in the past she has refused to take Apixaban. Her most recent echocardiogram on 06/14/13 reveals: - Left ventricle: The cavity size was normal. There was mild focal basal hypertrophy of the septum. Systolic function was vigorous. The estimated ejection fraction was in the range of 65% to 70%. Wall motion was normal; there were no regional wall motion abnormalities. The study is not technically sufficient to allow evaluation of LV diastolic function. - Aortic valve: There was moderate stenosis. Valve area (VTI): 0.71 cm^2. Valve area (Vmax): 0.78 cm^2. - Mitral valve: Moderately to severely calcified annulus. Moderately thickened, mildly calcified leaflets . Mild, late systolicprolapse, involving the posterior leaflet. There was mild regurgitation. Valve area by  continuity equation (using LVOT flow): 2.0 cm^2. - Left atrium: The atrium was moderately to severely dilated. - Tricuspid valve: There was moderate regurgitation. - Pulmonary arteries: PA peak pressure: 43 mm Hg (S).  since last visit the patient has had no recurrence of atrial flutter fibrillation.  She has remained in normal sinus rhythm.  She is off amiodarone and off anticoagulation.  She is not having any symptoms of increasing shortness of breath.  She does have a history of diastolic heart failure and valvular heart disease.  She is not having any orthopnea or paroxysmal nocturnal dyspnea or ankle edema. She has a past history of iron deficiency anemia but has been off iron recently and she states that her hemoglobin has been staying in a satisfactory range.  Current Outpatient Prescriptions  Medication Sig Dispense Refill  . cyanocobalamin 500 MCG tablet Take 500 mcg by mouth daily.    Marland Kitchen diltiazem (CARDIZEM) 60 MG tablet TAKE 1 TABLET (60 MG TOTAL) BY MOUTH 3 (THREE) TIMES DAILY. 90 tablet 5  . furosemide (LASIX) 20 MG tablet Take 1 tablet (20 mg total) by mouth daily. 30 tablet 0  . lansoprazole (PREVACID SOLUTAB) 30 MG disintegrating tablet Take 30 mg by mouth daily.    . Multiple Vitamins-Minerals (EYE VITAMINS PO) Take 1 tablet by mouth daily.    Marland Kitchen zolpidem (AMBIEN) 10 MG tablet Take 5 mg by mouth at bedtime.      No current facility-administered medications for this visit.    Allergies  Allergen Reactions  . Codeine Other (See Comments) and Nausea And Vomiting    Makes her sick  . Potassium Chloride  Loss of appetite  . Amiodarone Rash    Pulmonary distress Possible pulmonary reaction  . Asa [Aspirin] Other (See Comments) and Rash    Acid reflux  . Ramipril Rash  . Sulfa Antibiotics Rash  . Sulfonamide Derivatives Itching and Rash    Patient Active Problem List   Diagnosis Date Noted  . Pneumonia 06/19/2013  . Anemia 07/21/2012  . Black stools 07/21/2012  .  Atrial fibrillation with rapid ventricular response 07/05/2012  . Anticoagulated with Eliquis 07/05/2012  . Acute on chronic diastolic CHF (congestive heart failure), NYHA class 3 07/05/2012  . Acute diastolic heart failure   . Atrial fibrillation 06/22/2012  . Moderate aortic stenosis   . CHANGE IN BOWELS 11/14/2008  . FLATULENCE ERUCTATION AND GAS PAIN 10/29/2008  . DIARRHEA 10/29/2008  . Deficiency anemia 05/14/2007  . HYPERTENSION 05/14/2007  . GERD 05/14/2007  . OTHER DYSPHAGIA 05/14/2007  . HIATAL HERNIA 09/10/2005  . DIVERTICULOSIS, COLON 08/02/1998  . ESOPHAGITIS 10/01/1987    History  Smoking status  . Never Smoker   Smokeless tobacco  . Never Used    History  Alcohol Use No    Family History  Problem Relation Age of Onset  . Heart failure Father   . Heart disease Brother   . Diabetes Father   . Colon cancer Brother   . Lung cancer Sister   . Lymphoma Sister     Review of Systems: Constitutional: no fever chills diaphoresis or fatigue or change in weight.  Head and neck: no hearing loss, no epistaxis, no photophobia or visual disturbance. Respiratory: No cough, shortness of breath or wheezing. Cardiovascular: No chest pain peripheral edema, palpitations. Gastrointestinal: No abdominal distention, no abdominal pain, no change in bowel habits hematochezia or melena.  Recent diarrhea which she attributes it to her diverticulosis and not staying on a careful diet. Genitourinary: No dysuria, no frequency, no urgency, no nocturia. Musculoskeletal:No arthralgias, no back pain, no gait disturbance or myalgias. Neurological: No dizziness, no headaches, no numbness, no seizures, no syncope, no weakness, no tremors. Hematologic: No lymphadenopathy, no easy bruising. Psychiatric: No confusion, no hallucinations, no sleep disturbance.    Physical Exam: Filed Vitals:   02/06/14 1458  BP: 142/78  Pulse: 82   the general appearance reveals an elderly woman in no  acute distress.  She is slightly pale.The head and neck exam reveals pupils equal and reactive.  Extraocular movements are full.  There is no scleral icterus.  The mouth and pharynx are normal.  The neck is supple.  The carotids reveal no bruits.  The jugular venous pressure is normal.  The  thyroid is not enlarged.  There is no lymphadenopathy.  The chest is clear to percussion and auscultation.  There are no rales or rhonchi.  Expansion of the chest is symmetrical.  The precordium is quiet.  The first heart sound is normal.  The second heart sound is physiologically split.  There is a grade 3/6 harsh systolic ejection murmur over the aortic valve and a soft diastolic aortic murmur of aortic insufficiency. There is no abnormal lift or heave.  The abdomen is soft and nontender.  The bowel sounds are normal.  The liver and spleen are not enlarged.  There are no abdominal masses.  There are no abdominal bruits.  Extremities reveal good pedal pulses.  There is no phlebitis or edema.  There is no cyanosis or clubbing.  Strength is normal and symmetrical in all extremities.  There is no lateralizing weakness.  There are no sensory deficits.  The skin is warm and dry.  There is no rash.   EKG shows normal sinus rhythm with first degree AV block and there are occasional PVCs.  Assessment / Plan: 1. paroxysmal atrial fibrillation, holding normal sinus rhythm. 2. intolerance to multiple drugs including amiodarone which she feels gave her an acute pulmonary reaction. 3.  Not on anticoagulation because of her history of iron deficiency anemia and previous severe blood loss.  Patient refuses apixaban and she is allergic to aspirin. 4. chronic diastolic heart failure with ejection fraction 65-70%, stable on Lasix 5. essential hypertension 6. Anemia  Plan: Continue same medication.  Recheck in 4 months for office visit and EKG.

## 2014-02-06 NOTE — Patient Instructions (Signed)
Your physician recommends that you continue on your current medications as directed. Please refer to the Current Medication list given to you today.  Your physician wants you to follow-up in: 4 month ov/ekg  You will receive a reminder letter in the mail two months in advance. If you don't receive a letter, please call our office to schedule the follow-up appointment.  

## 2014-02-13 ENCOUNTER — Telehealth: Payer: Self-pay | Admitting: Hematology

## 2014-02-13 NOTE — Telephone Encounter (Signed)
, °

## 2014-02-23 ENCOUNTER — Ambulatory Visit: Payer: Medicare Other

## 2014-02-23 ENCOUNTER — Other Ambulatory Visit: Payer: Medicare Other

## 2014-03-02 ENCOUNTER — Other Ambulatory Visit: Payer: Self-pay

## 2014-03-02 ENCOUNTER — Ambulatory Visit: Payer: Self-pay | Admitting: Hematology

## 2014-03-23 ENCOUNTER — Other Ambulatory Visit: Payer: Self-pay | Admitting: *Deleted

## 2014-03-23 DIAGNOSIS — D649 Anemia, unspecified: Secondary | ICD-10-CM

## 2014-03-26 ENCOUNTER — Ambulatory Visit: Payer: Self-pay | Admitting: Hematology

## 2014-03-26 ENCOUNTER — Telehealth: Payer: Self-pay | Admitting: Hematology and Oncology

## 2014-03-26 ENCOUNTER — Other Ambulatory Visit: Payer: Self-pay

## 2014-03-26 NOTE — Telephone Encounter (Signed)
appts made and avs printed for pt contrast given  Anna Elliott °

## 2014-04-13 ENCOUNTER — Other Ambulatory Visit (HOSPITAL_BASED_OUTPATIENT_CLINIC_OR_DEPARTMENT_OTHER): Payer: Medicare Other

## 2014-04-13 ENCOUNTER — Ambulatory Visit (HOSPITAL_BASED_OUTPATIENT_CLINIC_OR_DEPARTMENT_OTHER): Payer: Medicare Other | Admitting: Hematology

## 2014-04-13 DIAGNOSIS — D649 Anemia, unspecified: Secondary | ICD-10-CM

## 2014-04-13 LAB — COMPREHENSIVE METABOLIC PANEL (CC13)
ALT: 6 U/L (ref 0–55)
AST: 10 U/L (ref 5–34)
Albumin: 3.9 g/dL (ref 3.5–5.0)
Alkaline Phosphatase: 76 U/L (ref 40–150)
Anion Gap: 11 mEq/L (ref 3–11)
BILIRUBIN TOTAL: 1.04 mg/dL (ref 0.20–1.20)
BUN: 24.5 mg/dL (ref 7.0–26.0)
CALCIUM: 8.6 mg/dL (ref 8.4–10.4)
CO2: 26 mEq/L (ref 22–29)
CREATININE: 1 mg/dL (ref 0.6–1.1)
Chloride: 104 mEq/L (ref 98–109)
EGFR: 52 mL/min/{1.73_m2} — ABNORMAL LOW (ref >90)
Glucose: 139 mg/dl (ref 70–140)
Potassium: 3.8 mEq/L (ref 3.5–5.1)
Sodium: 141 mEq/L (ref 136–145)
Total Protein: 6.2 g/dL — ABNORMAL LOW (ref 6.4–8.3)

## 2014-04-13 LAB — CBC WITH DIFFERENTIAL/PLATELET
BASO%: 0.5 % (ref 0.0–2.0)
Basophils Absolute: 0 10*3/uL (ref 0.0–0.1)
EOS%: 1.3 % (ref 0.0–7.0)
Eosinophils Absolute: 0.1 10*3/uL (ref 0.0–0.5)
HCT: 31 % — ABNORMAL LOW (ref 34.8–46.6)
HGB: 9.8 g/dL — ABNORMAL LOW (ref 11.6–15.9)
LYMPH%: 14.3 % (ref 14.0–49.7)
MCH: 28.3 pg (ref 25.1–34.0)
MCHC: 31.7 g/dL (ref 31.5–36.0)
MCV: 89.3 fL (ref 79.5–101.0)
MONO#: 0.3 10*3/uL (ref 0.1–0.9)
MONO%: 7.7 % (ref 0.0–14.0)
NEUT#: 3.4 10*3/uL (ref 1.5–6.5)
NEUT%: 76.2 % (ref 38.4–76.8)
Platelets: 184 10*3/uL (ref 145–400)
RBC: 3.48 10*6/uL — ABNORMAL LOW (ref 3.70–5.45)
RDW: 16.4 % — ABNORMAL HIGH (ref 11.2–14.5)
WBC: 4.5 10*3/uL (ref 3.9–10.3)
lymph#: 0.6 10*3/uL — ABNORMAL LOW (ref 0.9–3.3)

## 2014-04-13 LAB — IRON AND TIBC CHCC
%SAT: 25 % (ref 21–57)
Iron: 63 ug/dL (ref 41–142)
TIBC: 255 ug/dL (ref 236–444)
UIBC: 192 ug/dL (ref 120–384)

## 2014-04-13 LAB — HOLD TUBE, BLOOD BANK

## 2014-04-13 LAB — FERRITIN CHCC: Ferritin: 41 ng/ml (ref 9–269)

## 2014-04-14 ENCOUNTER — Encounter: Payer: Self-pay | Admitting: Hematology

## 2014-04-14 NOTE — Progress Notes (Signed)
Pt came in yesterday for follow up, had lab done, waited for 24mns and left without being seen by me or my nurse. She was previously seen by other physician here and I have never met her.   I called her today and spoke with her daughter. She was very upset and requested [place] refund her copay. I will report to pur management team.  I discussed her lab results from yesterday with her and suggested a follow up appointment in 2-3 weeks to discuss her anemia treatment but she declined.   FTruitt Merle 04/14/2014

## 2014-04-30 ENCOUNTER — Other Ambulatory Visit: Payer: Self-pay | Admitting: Cardiology

## 2014-05-28 ENCOUNTER — Other Ambulatory Visit: Payer: Self-pay | Admitting: Cardiology

## 2014-06-08 ENCOUNTER — Ambulatory Visit (INDEPENDENT_AMBULATORY_CARE_PROVIDER_SITE_OTHER): Payer: Medicare Other | Admitting: Cardiology

## 2014-06-08 ENCOUNTER — Encounter: Payer: Self-pay | Admitting: Cardiology

## 2014-06-08 VITALS — BP 140/68 | HR 79 | Ht 65.0 in | Wt 125.4 lb

## 2014-06-08 DIAGNOSIS — I5033 Acute on chronic diastolic (congestive) heart failure: Secondary | ICD-10-CM | POA: Diagnosis not present

## 2014-06-08 DIAGNOSIS — I350 Nonrheumatic aortic (valve) stenosis: Secondary | ICD-10-CM | POA: Diagnosis not present

## 2014-06-08 DIAGNOSIS — I480 Paroxysmal atrial fibrillation: Secondary | ICD-10-CM | POA: Diagnosis not present

## 2014-06-08 NOTE — Patient Instructions (Signed)
Medication Instructions:  Your physician recommends that you continue on your current medications as directed. Please refer to the Current Medication list given to you today.  Labwork: NONE  Testing/Procedures: NONE  Follow-Up: Your physician recommends that you schedule a follow-up appointment in: 4 MONTH OV/EKG   Any Other Special Instructions Will Be Listed Below (If Applicable).

## 2014-06-08 NOTE — Progress Notes (Signed)
Cardiology Office Note   Date:  06/08/2014   ID:  Anna Elliott, Anna Elliott November 05, 1921, MRN [id number]  PCP:  Sherrie Mustache, MD  Cardiologist: Darlin Coco MD  No chief complaint on file.     History of Present Illness: Anna Elliott is a 81 y.o. female who presents for a four-month follow-up office visit This pleasant 82 year old woman is seen for a scheduled followup office visit. She has a past history of paroxysmal atrial fibrillation with rapid ventricular response. She was admitted in June 2014. She was admitted at that time with atrial fibrillation with rapid ventricular response. She was also noted to have known moderate aortic stenosis. She had evidence of acute on chronic diastolic congestive heart failure. She has a history of hypertension. On 07/04/12 she underwent a TEE cardioversion. She converted to normal sinus rhythm with 200 J synchronized biphasic energy. She was discharged the next day maintaining sinus rhythm with first degree AV block. She did well until May 2015 when she went back into atrial fibrillation with rapid ventricular response. During her recent hospitalization her hemoglobin was noted to be dropping and her anticoagulation was discontinued. She is followed at the [facility] for anemia. She is allergic to aspirin and in the past she has refused to take Apixaban. Her most recent echocardiogram on 06/14/13 reveals: Normal left ventricular systolic function.  She has moderate aortic stenosis.  There is mild mitral valve prolapse with mild mitral regurgitation.    in February, after we last saw her, she went out of sinus rhythm.  Her daughter states that she was out of rhythm for about 3 weeks.  She was treated with bedrest and converted on her own back into sinus rhythm.  When in atrial fibrillation her heart rate was as high as 139.  She also developed stasis dermatitis when she stopped taking her Lasix for 3 days.  She developed a staphylococcal  skin infection on her lower legs requiring antibiotics She has a past history of iron deficiency anemia but has been off iron recently and she states that her hemoglobin has been staying in a satisfactory range.    Past Medical History  Diagnosis Date  . Hypertension   . Moderate aortic stenosis     a. Dx 2011. b. Echo 05/2012: EF 65%, mild LVH, mod AS mg 69mmHg, mildly dilated LA.  Marland Kitchen Chronic anemia     a. dating back to 2002 - BM bx: early mild dysplasia and/or sideroblastic changes.  Marland Kitchen Dysphagia   . GERD (gastroesophageal reflux disease)   . Esophagitis   . Hiatal hernia   . H/O: rheumatic fever     a. as a child  . Atrial fibrillation     a. Dx 05/2012 unknown duration, started on Eliqiuis.  . Chronic diastolic heart failure   . Tubular adenoma of colon 11/2008    Past Surgical History  Procedure Laterality Date  . Tonsillectomy    . Cesarean section  1962  . Abdominal hysterectomy  1967  . Cataract extraction w/ intraocular lens  implant, bilateral Bilateral 1990's  . Esophageal dilation      "twice in her lifetime, I think; most recent was early 2000's" (06/22/2012)  . Tee without cardioversion N/A 07/04/2012    Procedure: TRANSESOPHAGEAL ECHOCARDIOGRAM (TEE);  Surgeon: Fay Records, MD;  Location: [facility];  Service: Cardiovascular;  Laterality: N/A;  talk to [facility]  . Cardioversion N/A 07/04/2012    Procedure: CARDIOVERSION;  Surgeon: Fay Records, MD;  Location: [facility];  Service: Cardiovascular;  Laterality: N/A;     Current Outpatient Prescriptions  Medication Sig Dispense Refill  . cyanocobalamin 500 MCG tablet Take 500 mcg by mouth daily.    Marland Kitchen diltiazem (CARDIZEM) 60 MG tablet TAKE 1 TABLET (60 MG TOTAL) BY MOUTH 3 (THREE) TIMES DAILY. 90 tablet 0  . furosemide (LASIX) 20 MG tablet Take 1 tablet (20 mg total) by mouth daily. 30 tablet 0  . lansoprazole (PREVACID SOLUTAB) 30 MG disintegrating tablet Take 30 mg by mouth daily.    . Multiple Vitamins-Minerals (EYE  VITAMINS PO) Take 1 tablet by mouth daily.    Marland Kitchen zolpidem (AMBIEN) 10 MG tablet Take 5 mg by mouth at bedtime.      No current facility-administered medications for this visit.    Allergies:   Codeine; Potassium chloride; Amiodarone; Asa; Ramipril; Sulfa antibiotics; and Sulfonamide derivatives    Social History:  The patient  reports that she has never smoked. She has never used smokeless tobacco. She reports that she does not drink alcohol or use illicit drugs.   Family History:  The patient's family history includes Colon cancer in her brother; Diabetes in her father; Heart attack in her father; Heart disease in her brother; Lung cancer in her sister; Lymphoma in her sister; Macular degeneration in her mother.    ROS:  Please see the history of present illness.   Otherwise, review of systems are positive for none.   All other systems are reviewed and negative.    PHYSICAL EXAM: VS:  BP 140/68 mmHg  Pulse 79  Ht 5\' 5"  (1.651 m)  Wt 125 lb 6.4 oz (56.881 kg)  BMI 20.87 kg/m2 , BMI Body mass index is 20.87 kg/(m^2). GEN: Well nourished, well developed, in no acute distress HEENT: normal Neck: no JVD, carotid bruits, or masses Cardiac: RRR; there is a grade 3/6 systolic ejection murmur at the base. Respiratory:  clear to auscultation bilaterally, normal work of breathing GI: soft, nontender, nondistended, + BS MS: no deformity or atrophy Skin: warm and dry, no rash Neuro:  Strength and sensation are intact Psych: euthymic mood, full affect   EKG:  EKG is ordered today. The ekg ordered today demonstrates normal sinus rhythm and marked first-degree AV block unchanged from prior tracings.  Her PR interval is 326 ms.   Recent Labs: 06/13/2013: Magnesium 2.2; TSH 2.040 06/19/2013: Pro B Natriuretic peptide (BNP) 2094.0* 04/13/2014: ALT 6; BUN 24.5; Creatinine 1.0; Hemoglobin 9.8*; Platelets 184; Potassium 3.8; Sodium 141    Lipid Panel    Component Value Date/Time   CHOL 132  06/23/2012 0540   TRIG 100 06/23/2012 0540   HDL 41 06/23/2012 0540   CHOLHDL 3.2 06/23/2012 0540   VLDL 20 06/23/2012 0540   LDLCALC 71 06/23/2012 0540      Wt Readings from Last 3 Encounters:  06/08/14 125 lb 6.4 oz (56.881 kg)  02/06/14 132 lb (59.875 kg)  11/23/13 129 lb 8 oz (58.741 kg)        ASSESSMENT AND PLAN:  1. paroxysmal atrial fibrillation, holding normal sinus rhythm. 2. intolerance to multiple drugs including amiodarone which she feels gave her an acute pulmonary reaction. 3. Not on anticoagulation because of her history of iron deficiency anemia and previous severe blood loss. Patient refuses apixaban and she is allergic to aspirin. 4. chronic diastolic heart failure with ejection fraction 65-70%, stable on Lasix 5. essential hypertension 6. Anemia  Plan: Continue same medication. Recheck in 4 months for  office visit and EKG.   Current medicines are reviewed at length with the patient today.  The patient does not have concerns regarding medicines.  The following changes have been made:  no change  Labs/ tests ordered today include:  No orders of the defined types were placed in this encounter.    Berna Spare MD 06/08/2014 3:44 PM    [address], [place], [place]  [address] Phone: [phone]; Fax: [phone]

## 2014-06-21 ENCOUNTER — Inpatient Hospital Stay (HOSPITAL_COMMUNITY)
Admission: AD | Admit: 2014-06-21 | Discharge: 2014-06-29 | DRG: 309 | Disposition: A | Discharge status: Home / Self Care | Payer: Medicare Other | Source: Intra-hospital | Attending: Cardiology | Admitting: Cardiology

## 2014-06-21 ENCOUNTER — Other Ambulatory Visit (HOSPITAL_COMMUNITY): Payer: Self-pay

## 2014-06-21 ENCOUNTER — Encounter (HOSPITAL_COMMUNITY): Payer: Self-pay | Admitting: Emergency Medicine

## 2014-06-21 ENCOUNTER — Ambulatory Visit (HOSPITAL_COMMUNITY): Payer: Medicare Other

## 2014-06-21 ENCOUNTER — Encounter (HOSPITAL_COMMUNITY): Admission: AD | Disposition: A | Discharge status: Home / Self Care | Payer: Self-pay | Attending: Cardiology

## 2014-06-21 DIAGNOSIS — D509 Iron deficiency anemia, unspecified: Secondary | ICD-10-CM | POA: Diagnosis present

## 2014-06-21 DIAGNOSIS — I4892 Unspecified atrial flutter: Secondary | ICD-10-CM | POA: Diagnosis present

## 2014-06-21 DIAGNOSIS — I129 Hypertensive chronic kidney disease with stage 1 through stage 4 chronic kidney disease, or unspecified chronic kidney disease: Secondary | ICD-10-CM | POA: Diagnosis present

## 2014-06-21 DIAGNOSIS — Z9842 Cataract extraction status, left eye: Secondary | ICD-10-CM

## 2014-06-21 DIAGNOSIS — I350 Nonrheumatic aortic (valve) stenosis: Secondary | ICD-10-CM | POA: Diagnosis present

## 2014-06-21 DIAGNOSIS — I480 Paroxysmal atrial fibrillation: Secondary | ICD-10-CM | POA: Diagnosis present

## 2014-06-21 DIAGNOSIS — I10 Essential (primary) hypertension: Secondary | ICD-10-CM | POA: Diagnosis present

## 2014-06-21 DIAGNOSIS — Z9841 Cataract extraction status, right eye: Secondary | ICD-10-CM | POA: Diagnosis not present

## 2014-06-21 DIAGNOSIS — Z886 Allergy status to analgesic agent status: Secondary | ICD-10-CM

## 2014-06-21 DIAGNOSIS — I5032 Chronic diastolic (congestive) heart failure: Secondary | ICD-10-CM | POA: Diagnosis present

## 2014-06-21 DIAGNOSIS — Z961 Presence of intraocular lens: Secondary | ICD-10-CM | POA: Diagnosis present

## 2014-06-21 DIAGNOSIS — Z90710 Acquired absence of both cervix and uterus: Secondary | ICD-10-CM | POA: Diagnosis not present

## 2014-06-21 DIAGNOSIS — Z888 Allergy status to other drugs, medicaments and biological substances status: Secondary | ICD-10-CM

## 2014-06-21 DIAGNOSIS — I481 Persistent atrial fibrillation: Secondary | ICD-10-CM | POA: Diagnosis not present

## 2014-06-21 DIAGNOSIS — K449 Diaphragmatic hernia without obstruction or gangrene: Secondary | ICD-10-CM | POA: Diagnosis present

## 2014-06-21 DIAGNOSIS — E876 Hypokalemia: Secondary | ICD-10-CM | POA: Diagnosis not present

## 2014-06-21 DIAGNOSIS — N183 Chronic kidney disease, stage 3 unspecified: Secondary | ICD-10-CM

## 2014-06-21 DIAGNOSIS — Z882 Allergy status to sulfonamides status: Secondary | ICD-10-CM

## 2014-06-21 DIAGNOSIS — K219 Gastro-esophageal reflux disease without esophagitis: Secondary | ICD-10-CM | POA: Diagnosis present

## 2014-06-21 DIAGNOSIS — I4891 Unspecified atrial fibrillation: Secondary | ICD-10-CM | POA: Diagnosis not present

## 2014-06-21 DIAGNOSIS — D649 Anemia, unspecified: Secondary | ICD-10-CM | POA: Diagnosis present

## 2014-06-21 LAB — CBC WITH DIFFERENTIAL/PLATELET
Basophils Absolute: 0 10*3/uL (ref 0.0–0.1)
Basophils Relative: 0 % (ref 0–1)
Eosinophils Absolute: 0 10*3/uL (ref 0.0–0.7)
Eosinophils Relative: 1 % (ref 0–5)
HCT: 33 % — ABNORMAL LOW (ref 36.0–46.0)
HEMOGLOBIN: 10.9 g/dL — AB (ref 12.0–15.0)
LYMPHS PCT: 10 % — AB (ref 12–46)
Lymphs Abs: 0.6 10*3/uL — ABNORMAL LOW (ref 0.7–4.0)
MCH: 29.6 pg (ref 26.0–34.0)
MCHC: 33 g/dL (ref 30.0–36.0)
MCV: 89.7 fL (ref 78.0–100.0)
MONOS PCT: 7 % (ref 3–12)
Monocytes Absolute: 0.5 10*3/uL (ref 0.1–1.0)
NEUTROS PCT: 82 % — AB (ref 43–77)
Neutro Abs: 5.1 10*3/uL (ref 1.7–7.7)
Platelets: 189 10*3/uL (ref 150–400)
RBC: 3.68 MIL/uL — ABNORMAL LOW (ref 3.87–5.11)
RDW: 14.9 % (ref 11.5–15.5)
WBC: 6.2 10*3/uL (ref 4.0–10.5)

## 2014-06-21 LAB — BASIC METABOLIC PANEL
Anion gap: 10 (ref 5–15)
BUN: 21 mg/dL — ABNORMAL HIGH (ref 6–20)
CO2: 26 mmol/L (ref 22–32)
CREATININE: 1.18 mg/dL — AB (ref 0.44–1.00)
Calcium: 9 mg/dL (ref 8.9–10.3)
Chloride: 102 mmol/L (ref 101–111)
GFR calc non Af Amer: 39 mL/min — ABNORMAL LOW (ref >60)
GFR, EST AFRICAN AMERICAN: 45 mL/min — AB (ref >60)
Glucose, Bld: 179 mg/dL — ABNORMAL HIGH (ref 65–99)
POTASSIUM: 4 mmol/L (ref 3.5–5.1)
Sodium: 138 mmol/L (ref 135–145)

## 2014-06-21 LAB — I-STAT TROPONIN, ED: Troponin i, poc: 0.02 ng/mL (ref 0.00–0.08)

## 2014-06-21 LAB — BRAIN NATRIURETIC PEPTIDE: B NATRIURETIC PEPTIDE 5: 386.9 pg/mL — AB (ref 0.0–100.0)

## 2014-06-21 LAB — TSH: TSH: 0.957 u[IU]/mL (ref 0.350–4.500)

## 2014-06-21 LAB — TROPONIN I: Troponin I: <0.03 ng/mL (ref <0.031)

## 2014-06-21 SURGERY — LEFT HEART CATH AND CORONARY ANGIOGRAPHY
Anesthesia: LOCAL

## 2014-06-21 MED ORDER — ACETAMINOPHEN 325 MG PO TABS
650.0000 mg | ORAL_TABLET | ORAL | Status: DC | PRN
  Administered 2014-06-22 – 2014-06-28 (×7): 650 mg via ORAL
  Filled 2014-06-21 (×8): qty 2

## 2014-06-21 MED ORDER — DILTIAZEM HCL 25 MG/5ML IV SOLN
10.0000 mg | Freq: Once | INTRAVENOUS | Status: AC
  Administered 2014-06-21: 10 mg via INTRAVENOUS
  Filled 2014-06-21: qty 5

## 2014-06-21 MED ORDER — ZOLPIDEM TARTRATE 5 MG PO TABS
5.0000 mg | ORAL_TABLET | Freq: Every day | ORAL | Status: DC
  Administered 2014-06-21 – 2014-06-28 (×8): 5 mg via ORAL
  Filled 2014-06-21 (×8): qty 1

## 2014-06-21 MED ORDER — PANTOPRAZOLE SODIUM 40 MG PO PACK
40.0000 mg | PACK | Freq: Every day | ORAL | Status: DC
  Filled 2014-06-21: qty 20

## 2014-06-21 MED ORDER — LANSOPRAZOLE 30 MG PO TBDP: 30.0000 mg | ORAL_TABLET | Freq: Every day | ORAL | Status: DC

## 2014-06-21 MED ORDER — DILTIAZEM HCL 25 MG/5ML IV SOLN: 10.0000 mg | Freq: Once | INTRAVENOUS | Status: DC

## 2014-06-21 MED ORDER — DILTIAZEM HCL 100 MG IV SOLR: 5.0000 mg/h | INTRAVENOUS | Status: DC

## 2014-06-21 MED ORDER — DIPHENHYDRAMINE HCL 50 MG/ML IJ SOLN
25.0000 mg | Freq: Once | INTRAMUSCULAR | Status: DC
  Filled 2014-06-21: qty 1

## 2014-06-21 MED ORDER — METOPROLOL TARTRATE 1 MG/ML IV SOLN
5.0000 mg | INTRAVENOUS | Status: DC | PRN
  Filled 2014-06-21: qty 5

## 2014-06-21 MED ORDER — DILTIAZEM HCL 90 MG PO TABS
90.0000 mg | ORAL_TABLET | Freq: Three times a day (TID) | ORAL | Status: DC
  Administered 2014-06-21 – 2014-06-23 (×6): 90 mg via ORAL
  Filled 2014-06-21 (×9): qty 1

## 2014-06-21 MED ORDER — SODIUM CHLORIDE 0.9 % IV BOLUS (SEPSIS)
1000.0000 mL | Freq: Once | INTRAVENOUS | Status: AC
  Administered 2014-06-21: 1000 mL via INTRAVENOUS

## 2014-06-21 MED ORDER — CYANOCOBALAMIN 500 MCG PO TABS
500.0000 ug | ORAL_TABLET | Freq: Every day | ORAL | Status: DC
  Administered 2014-06-22 – 2014-06-29 (×8): 500 ug via ORAL
  Filled 2014-06-21 (×9): qty 1

## 2014-06-21 MED ORDER — ONDANSETRON HCL 4 MG/2ML IJ SOLN: 4.0000 mg | Freq: Four times a day (QID) | INTRAMUSCULAR | Status: DC | PRN

## 2014-06-21 MED ORDER — FUROSEMIDE 20 MG PO TABS
20.0000 mg | ORAL_TABLET | Freq: Every day | ORAL | Status: DC
  Administered 2014-06-22: 20 mg via ORAL
  Filled 2014-06-21: qty 1

## 2014-06-21 NOTE — Progress Notes (Addendum)
Currently on no rate control due to redness occur after intiiating IV diltiazem. Per nursing note, Dr. Sherry Ruffing notified and told to hold IV cardizem gtt. Will initiate 90mg  q 8hr of PO diltiazem for rate control.   Anna Corrigan PA Pager: [phone]

## 2014-06-21 NOTE — ED Notes (Signed)
Red raised spot appeared on pts rt AC. Pt reported that the site was beginning to itch. RN notified.

## 2014-06-21 NOTE — ED Notes (Signed)
Per EMS, pt coming in today do to increased fatigue, chest discomfort and back pain. NAD at this time. Pt alert x4. HR:140's, SpO2: 98%, BP:170/95. EMS gave 1 nitro in route.

## 2014-06-21 NOTE — ED Provider Notes (Signed)
CSN: [id number]     Arrival date & time 06/21/14  1454 History   None    Chief Complaint  Patient presents with  . Chest Pain   Anna Elliott is a 81 y.o. female ED with a past medical history significant for atrial fibrillation, diastolic heart failure, GERD, hypertension, aortic stenosis who presents with fatigue and tachycardia. The patient was a code STEMI from the field by EMS. The patient reports that she has had approximate one week of intermittent sensations of heart racing and fatigue. The patient says that she's felt this before and had to "get my heart shocked" when it was in an arrhythmia. The patient says that she does not take any anticoagulation medicine as during her last admission, the patient had a bleed. The patient's visit with denies any fevers, chills, shortness of breath, nausea, vomiting, gas patient, diarrhea, dysuria, rashes. The patient denies any new medication changes. Next  The patient's comes by her daughter reports that the patient has been taking her medications as directed.  On arrival, the patient's heart rate is in the 140s. Initially, there was concern for possible SVT. Vagal maneuvers were attempted and were unsuccessful. The cardiology team arrived at the bedside and recommended lab workup as well as IV diltiazem administration. The plan will be to workup the patient for her symptoms.    (Consider location/radiation/quality/duration/timing/severity/associated sxs/prior Treatment) Patient is a 81 y.o. female presenting with palpitations. The history is provided by the patient, a relative and medical records. No language interpreter was used.  Palpitations Palpitations quality:  Fast Onset quality:  Gradual Duration:  1 week Timing:  Intermittent Progression:  Waxing and waning Chronicity:  New Relieved by:  Nothing Worsened by:  Nothing Ineffective treatments:  None tried Associated symptoms: no back pain, no chest pain, no cough, no diaphoresis, no  dizziness, no leg pain, no nausea, no near-syncope, no shortness of breath and no vomiting   Associated symptoms comment:  Fatigue  Risk factors: hx of atrial fibrillation     Past Medical History  Diagnosis Date  . Hypertension   . Moderate aortic stenosis     a. Dx 2011. b. Echo 05/2012: EF 65%, mild LVH, mod AS mg 16mmHg, mildly dilated LA.  Marland Kitchen Chronic anemia     a. dating back to 2002 - BM bx: early mild dysplasia and/or sideroblastic changes.  Marland Kitchen Dysphagia   . GERD (gastroesophageal reflux disease)   . Esophagitis   . Hiatal hernia   . H/O: rheumatic fever     a. as a child  . Atrial fibrillation     a. Dx 05/2012 unknown duration, started on Eliqiuis.  . Chronic diastolic heart failure   . Tubular adenoma of colon 11/2008   Past Surgical History  Procedure Laterality Date  . Tonsillectomy    . Cesarean section  1962  . Abdominal hysterectomy  1967  . Cataract extraction w/ intraocular lens  implant, bilateral Bilateral 1990's  . Esophageal dilation      "twice in her lifetime, I think; most recent was early 2000's" (06/22/2012)  . Tee without cardioversion N/A 07/04/2012    Procedure: TRANSESOPHAGEAL ECHOCARDIOGRAM (TEE);  Surgeon: Fay Records, MD;  Location: [facility];  Service: Cardiovascular;  Laterality: N/A;  talk to [facility]  . Cardioversion N/A 07/04/2012    Procedure: CARDIOVERSION;  Surgeon: Fay Records, MD;  Location: [facility] ENDOSCOPY;  Service: Cardiovascular;  Laterality: N/A;   Family History  Problem Relation Age of Onset  .  Diabetes Father   . Heart attack Father   . Heart disease Brother   . Colon cancer Brother   . Lung cancer Sister   . Lymphoma Sister   . Macular degeneration Mother    History  Substance Use Topics  . Smoking status: Never Smoker   . Smokeless tobacco: Never Used  . Alcohol Use: No   OB History    No data available     Review of Systems  Constitutional: Positive for fatigue. Negative for chills, diaphoresis and appetite change.   HENT: Negative for sneezing.   Eyes: Negative for visual disturbance.  Respiratory: Negative for cough, chest tightness, shortness of breath, wheezing and stridor.   Cardiovascular: Positive for palpitations. Negative for chest pain, leg swelling and near-syncope.  Gastrointestinal: Negative for nausea, vomiting, abdominal pain, diarrhea, constipation and abdominal distention.  Genitourinary: Negative for dysuria.  Musculoskeletal: Negative for back pain and neck pain.  Neurological: Negative for dizziness, syncope and headaches.  Psychiatric/Behavioral: Negative for agitation.  All other systems reviewed and are negative.     Allergies  Codeine; Potassium chloride; Amiodarone; Asa; Ramipril; Sulfa antibiotics; and Sulfonamide derivatives  Home Medications   Prior to Admission medications   Medication Sig Start Date End Date Taking? Authorizing Provider  cyanocobalamin 500 MCG tablet Take 500 mcg by mouth daily.    Historical Provider, MD  diltiazem (CARDIZEM) 60 MG tablet TAKE 1 TABLET (60 MG TOTAL) BY MOUTH 3 (THREE) TIMES DAILY. 05/28/14   Darlin Coco, MD  furosemide (LASIX) 20 MG tablet Take 1 tablet (20 mg total) by mouth daily. 06/23/13   Charlynne Cousins, MD  lansoprazole (PREVACID SOLUTAB) 30 MG disintegrating tablet Take 30 mg by mouth daily.    Historical Provider, MD  Multiple Vitamins-Minerals (EYE VITAMINS PO) Take 1 tablet by mouth daily.    Historical Provider, MD  zolpidem (AMBIEN) 10 MG tablet Take 5 mg by mouth at bedtime.  09/14/11   Historical Provider, MD   BP 148/102 mmHg  Pulse 145  Resp 21  SpO2 100% Physical Exam  Constitutional: She is oriented to person, place, and time. She appears well-developed and well-nourished. No distress.  HENT:  Head: Normocephalic.  Mouth/Throat: No oropharyngeal exudate.  Eyes: Conjunctivae and EOM are normal. Pupils are equal, round, and reactive to light. No scleral icterus.  Neck: Normal range of motion.   Cardiovascular: Normal heart sounds and intact distal pulses.  Tachycardia present.   No murmur heard. Pulmonary/Chest: No stridor. She exhibits no tenderness.  Abdominal: Soft. Bowel sounds are normal. She exhibits no distension. There is no tenderness. There is no rebound.  Neurological: She is alert and oriented to person, place, and time. She has normal reflexes. She displays normal reflexes. She exhibits normal muscle tone.  Skin: Skin is warm. She is not diaphoretic. No pallor.  Psychiatric: She has a normal mood and affect.  Nursing note and vitals reviewed.   ED Course  Procedures (including critical care time) Labs Review Labs Reviewed  CBC WITH DIFFERENTIAL/PLATELET - Abnormal; Notable for the following:    RBC 3.68 (*)    Hemoglobin 10.9 (*)    HCT 33.0 (*)    Neutrophils Relative % 82 (*)    Lymphocytes Relative 10 (*)    Lymphs Abs 0.6 (*)    All other components within normal limits  BASIC METABOLIC PANEL - Abnormal; Notable for the following:    Glucose, Bld 179 (*)    BUN 21 (*)    Creatinine,  Ser 1.18 (*)    GFR calc non Af Amer 39 (*)    GFR calc Af Amer 45 (*)    All other components within normal limits  BRAIN NATRIURETIC PEPTIDE - Abnormal; Notable for the following:    B Natriuretic Peptide 386.9 (*)    All other components within normal limits  TSH  TROPONIN I  URINALYSIS, ROUTINE W REFLEX MICROSCOPIC (NOT AT [facility])  TROPONIN I  TROPONIN I  HEMOGLOBIN L3Y  BASIC METABOLIC PANEL  LIPID PANEL  I-STAT TROPOININ, ED  Randolm Idol, ED    Imaging Review Dg Chest Portable 1 View  06/21/2014   CLINICAL DATA:  Tachycardia on off for 1 week. Aching across back of neck. Hypertension.  EXAM: PORTABLE CHEST - 1 VIEW  COMPARISON:  06/19/2013  FINDINGS: Mild hyperinflation. Heart is mildly enlarged. No confluent airspace opacities or effusions. No acute bony abnormality.  IMPRESSION: Mild hyperinflation and cardiomegaly.  No active disease.   Electronically  Signed   By: Rolm Baptise M.D.   On: 06/21/2014 16:10     EKG Interpretation None      MDM   Anna Elliott is a 81 y.o. female ED with a past medical history significant for atrial fibrillation, diastolic heart failure, GERD, hypertension, aortic stenosis who presents with fatigue and tachycardia. In route, the patient was activated as a code STEMI. Upon arrival, the patient is tachycardic into the 140s and is complaining of fatigue and palpitations. The patient had an EKG performed which showed atrial fibrillation with RVR.  The cardiology team was at the bedside quickly and recommended management with IV diltiazem for rate control. The patient's heart rate improved following administration of IV diltiazem.  The patient had improvement in her symptoms as her heart rate slowed down.  The patient was then admitted for further management of her aphid with RVR. The patient did not have any problems while remaining in the emergency Department and was admitted in stable condition.  This patient was seen with Dr. Rogene Houston, emergency medicine attending.  Final diagnoses:  Atrial fibrillation with rapid ventricular response          Antony Blackbird, MD 06/22/14 0230  Fredia Sorrow, MD 06/26/14 1352

## 2014-06-21 NOTE — ED Notes (Signed)
MD at bedside. Cardiology 

## 2014-06-21 NOTE — ED Notes (Signed)
Pt was walking out of restroom with family while I was walking to her room. She has just used the restroom and stated it would be awhile before she could go again.

## 2014-06-21 NOTE — Progress Notes (Signed)
ANTICOAGULATION CONSULT NOTE - Initial Consult  Pharmacy Consult for LMWH Indication: VTE prophylaxis  Allergies  Allergen Reactions  . Codeine Other (See Comments) and Nausea And Vomiting    Makes her sick  . Potassium Chloride     Loss of appetite  . Amiodarone Rash    Pulmonary distress Possible pulmonary reaction  . Asa [Aspirin] Other (See Comments) and Rash    Acid reflux  . Ramipril Rash  . Sulfa Antibiotics Rash  . Sulfonamide Derivatives Itching and Rash    Patient Measurements:    Weight: 57 kg  Vital Signs: BP: 150/97 mmHg (05/26 1630) Pulse Rate: 134 (05/26 1630)  Labs:  Recent Labs  06/21/14 1506  HGB 10.9*  HCT 33.0*  PLT 189  CREATININE 1.18*    Estimated Creatinine Clearance: 27.9 mL/min (by C-G formula based on Cr of 1.18).   Medical History: Past Medical History  Diagnosis Date  . Hypertension   . Moderate aortic stenosis     a. Dx 2011. b. Echo 05/2012: EF 65%, mild LVH, mod AS mg 11mmHg, mildly dilated LA.  Marland Kitchen Chronic anemia     a. dating back to 2002 - BM bx: early mild dysplasia and/or sideroblastic changes.  Marland Kitchen Dysphagia   . GERD (gastroesophageal reflux disease)   . Esophagitis   . Hiatal hernia   . H/O: rheumatic fever     a. as a child  . Atrial fibrillation     a. Dx 05/2012 unknown duration, started on Eliqiuis.  . Chronic diastolic heart failure   . Tubular adenoma of colon 11/2008   Assessment: 81 yo F with PAF.  Pharmacy consulted to dose LMWH for VTE px.  " No full dose lovenox even though pt is in afib, as she refused systemic anticoagulation due to h/o bleeding".  Wt ~ 57 kg,  Creat 1.18; H/H 10.9/33, pltc 189.   Goal of Therapy:  Monitor platelets by anticoagulation protocol: Yes   Plan:  -LMWH 30 mg q24 for VTE prophylaxis -pharmacy to sign off thanks  Eudelia Bunch, Sherian Rein.D. [phone] 06/21/2014 4:51 PM

## 2014-06-21 NOTE — Progress Notes (Signed)
Received pt with hr 140's. Bp stable. Pt having slight chest discomfort. Spoke with [employer] PA, Cardizem 90 mg po given as ordered. Hr remains between 120-140s

## 2014-06-21 NOTE — H&P (Signed)
Patient ID: Anna Elliott MRN: [id number], DOB/AGE: 79/13/1923   Admit date: 06/21/2014   Primary Physician: Sherrie Mustache, MD Primary Cardiologist: Dr. Mare Ferrari  Pt. Profile:   81 year old with PMH of HTN, chronic anemia, moderate AS, GERD/hiatal hernia, history of rheumatic fever, history of PAF with multiple recurrences, and chronic diastolic heart failure present with a-fib with RVR.   Problem List  Past Medical History  Diagnosis Date  . Hypertension   . Moderate aortic stenosis     a. Dx 2011. b. Echo 05/2012: EF 65%, mild LVH, mod AS mg 34mmHg, mildly dilated LA.  Marland Kitchen Chronic anemia     a. dating back to 2002 - BM bx: early mild dysplasia and/or sideroblastic changes.  Marland Kitchen Dysphagia   . GERD (gastroesophageal reflux disease)   . Esophagitis   . Hiatal hernia   . H/O: rheumatic fever     a. as a child  . Atrial fibrillation     a. Dx 05/2012 unknown duration, started on Eliqiuis.  . Chronic diastolic heart failure   . Tubular adenoma of colon 11/2008    Past Surgical History  Procedure Laterality Date  . Tonsillectomy    . Cesarean section  1962  . Abdominal hysterectomy  1967  . Cataract extraction w/ intraocular lens  implant, bilateral Bilateral 1990's  . Esophageal dilation      "twice in her lifetime, I think; most recent was early 2000's" (06/22/2012)  . Tee without cardioversion N/A 07/04/2012    Procedure: TRANSESOPHAGEAL ECHOCARDIOGRAM (TEE);  Surgeon: Fay Records, MD;  Location: [facility];  Service: Cardiovascular;  Laterality: N/A;  talk to [facility]  . Cardioversion N/A 07/04/2012    Procedure: CARDIOVERSION;  Surgeon: Fay Records, MD;  Location: [facility] ENDOSCOPY;  Service: Cardiovascular;  Laterality: N/A;     Allergies  Allergies  Allergen Reactions  . Codeine Other (See Comments) and Nausea And Vomiting    Makes her sick  . Potassium Chloride     Loss of appetite  . Amiodarone Rash    Pulmonary distress Possible pulmonary reaction  . Asa  [Aspirin] Other (See Comments) and Rash    Acid reflux  . Ramipril Rash  . Sulfa Antibiotics Rash  . Sulfonamide Derivatives Itching and Rash    HPI  The patient is a 81 year old with PMH of HTN, chronic anemia, moderate AS, GERD/hiatal hernia, history of rheumatic fever, history of PAF with multiple recurrences, and chronic diastolic heart failure. Patient has been followed by Dr. Mare Ferrari. She was initially diagnosed with atrial fibrillation back in 2014 and underwent TEE cardioversion and placed on eliquis. She had recurrence of atrial fibrillation in May 2015. Unfortunately she also had drop in hemoglobin concerning for iron deficiency anemia. Her eliquis was discontinued. She is allergic to aspirin. She has not been on any anticoagulation therapy understanding higher chance of stroke. She was at one point on amiodarone, due to the daughter's concern of pulmonary toxicity associated with amiodarone, this was discontinued. Her last echocardiogram obtained on 06/14/2013, which showed EF 65-70%, no regional wall motion abnormality, moderate AS, moderately to severely calcified mitral valve with mild MR, moderate to severely dilated left atrium, moderate TR, peak PA pressure 75mmHg. Unfortunately, with lack of an arrhythmic therapy in this elderly patient, she is prone to have recurrence of atrial fibrillation. Her last recurrence of atrial fibrillation was in February 2016. She spontaneously converted back into sinus rhythm after a few weeks.   She started having tachycardia and palpitation on  06/11/2014. She has been going in and out of atrial fibrillation since then. She also complained of substernal chest discomfort which she is not sure if related to acid reflux. The symptom worsened to the degree she decided to seek medical attention on 06/21/2014. EMS noted diffuse ST depression on EKG and activated code STEMI. On arrival to [facility] ED, patient was tachycardic with heart rate in the 140s, code  STEMI was canceled as patient was more likely in atrial fibrillation with RVR. Repeat EKG showed improvement of ST depression. She denies any recent fever, chill, cough, shortness breath, or any history of thyroid problem. She was started on IV diltiazem in the ED.   Home Medications  Prior to Admission medications   Medication Sig Start Date End Date Taking? Authorizing Provider  cyanocobalamin 500 MCG tablet Take 500 mcg by mouth daily.    Historical Provider, MD  diltiazem (CARDIZEM) 60 MG tablet TAKE 1 TABLET (60 MG TOTAL) BY MOUTH 3 (THREE) TIMES DAILY. 05/28/14   Darlin Coco, MD  furosemide (LASIX) 20 MG tablet Take 1 tablet (20 mg total) by mouth daily. 06/23/13   Charlynne Cousins, MD  lansoprazole (PREVACID SOLUTAB) 30 MG disintegrating tablet Take 30 mg by mouth daily.    Historical Provider, MD  Multiple Vitamins-Minerals (EYE VITAMINS PO) Take 1 tablet by mouth daily.    Historical Provider, MD  zolpidem (AMBIEN) 10 MG tablet Take 5 mg by mouth at bedtime.  09/14/11   Historical Provider, MD    Family History  Family History  Problem Relation Age of Onset  . Diabetes Father   . Heart attack Father   . Heart disease Brother   . Colon cancer Brother   . Lung cancer Sister   . Lymphoma Sister   . Macular degeneration Mother     Social History  History   Social History  . Marital Status: Widowed    Spouse Name: N/A  . Number of Children: N/A  . Years of Education: N/A   Occupational History  . Retired    Social History Main Topics  . Smoking status: Never Smoker   . Smokeless tobacco: Never Used  . Alcohol Use: No  . Drug Use: No  . Sexual Activity: Not on file   Other Topics Concern  . Not on file   Social History Narrative   Lives in [place] with her dtr and son-in-law.  She is active around the house and occasionally goes for walks at the mall.  No h/o falls though she notes that her macular degeneration limits her vision somewhat and she is prone to  losing her balance on inclines.     Review of Systems General:  No chills, fever, night sweats or weight changes.  Cardiovascular:  No edema, orthopnea, paroxysmal nocturnal dyspnea. + intermittent chest discomfort since last Monday, palpitation Dermatological: No rash, lesions/masses Respiratory: No cough, dyspnea Urologic: No hematuria, dysuria Abdominal:   No nausea, vomiting, diarrhea, bright red blood per rectum, melena, or hematemesis Neurologic:  No visual changes, changes in mental status. +weakness All other systems reviewed and are otherwise negative except as noted above.  Physical Exam  Blood pressure 148/102, pulse 145, resp. rate 21, SpO2 100 %.  General: Pleasant, NAD Psych: Normal affect. Neuro: Alert and oriented X 3. Moves all extremities spontaneously. HEENT: Normal  Neck: Supple without bruits or JVD. Lungs:  Resp regular and unlabored, CTA. Heart: Tachycardic. no s3, s4. 1/6 systolic murmur Abdomen: Soft, non-tender, non-distended, BS +  x 4.  Extremities: No clubbing, cyanosis or edema. DP/PT/Radials 2+ and equal bilaterally.  Labs  Troponin (Point of Care Test) No results for input(s): TROPIPOC in the last 72 hours. No results for input(s): CKTOTAL, CKMB, TROPONINI in the last 72 hours. Lab Results  Component Value Date   WBC 4.5 04/13/2014   HGB 9.8* 04/13/2014   HCT 31.0* 04/13/2014   MCV 89.3 04/13/2014   PLT 184 04/13/2014   No results for input(s): NA, K, CL, CO2, BUN, CREATININE, CALCIUM, PROT, BILITOT, ALKPHOS, ALT, AST, GLUCOSE in the last 168 hours.  Invalid input(s): LABALBU Lab Results  Component Value Date   CHOL 132 06/23/2012   HDL 41 06/23/2012   LDLCALC 71 06/23/2012   TRIG 100 06/23/2012   ECG  Afib/aflutter with RVR with mildly depressed ST segment diffusely  Echocardiogram 06/14/2013  LV EF: 65% -  70%  ------------------------------------------------------------------- Indications:   Atrial fibrillation -  427.31.  ------------------------------------------------------------------- History:  Risk factors: Hypertension.  ------------------------------------------------------------------- Study Conclusions  - Left ventricle: The cavity size was normal. There was mild focal basal hypertrophy of the septum. Systolic function was vigorous. The estimated ejection fraction was in the range of 65% to 70%. Wall motion was normal; there were no regional wall motion abnormalities. The study is not technically sufficient to allow evaluation of LV diastolic function. - Aortic valve: There was moderate stenosis. Valve area (VTI): 0.71 cm^2. Valve area (Vmax): 0.78 cm^2. - Mitral valve: Moderately to severely calcified annulus. Moderately thickened, mildly calcified leaflets . Mild, late systolicprolapse, involving the posterior leaflet. There was mild regurgitation. Valve area by continuity equation (using LVOT flow): 2.0 cm^2. - Left atrium: The atrium was moderately to severely dilated. - Tricuspid valve: There was moderate regurgitation. - Pulmonary arteries: PA peak pressure: 43 mm Hg (S).     ASSESSMENT AND PLAN  1. Atrial fibrillation with RVR  - CHA2DS2-Vasc score 5 (age, female, HTN, HF)  - start IV diltiazem, trend enzyme. Obtain TSH  - No systemic anticoagulation since 2015 due to iron deficiency anemia. No ASA due to allergy  - would not cardioversion given lack of systemic anticoagulation to protect against stroke, focus on rate, patient may self-convert given what happened during previous a-fib episodes  2. Chest discomfort  - unclear if GI vs cardiac in nature. H/o hiatal hernia/GERD, currently in a-fib with RVR  - trend enzyme, diffuse ST depression during tachycardic episode likely related to supply/demand mismatch  3. chronic diastolic heart failure - stable  - obtain echocardiogram  4. HTN 5. Chronic anemia 6. moderate AS 7. GERD/hiatal hernia 8.  history of rheumatic fever 9. Recent acute renal insufficiency  - per patient, her PCP told her recently her renal function declined, recent lab not in epic. Will need to recheck.  Hilbert Corrigan, PA-C 06/21/2014, 3:24 PM As above, patient seen and examined. Briefly she is a 81 year old patient followed by Dr. Mare Ferrari with paroxysmal atrial fibrillation, moderate aortic stenosis, hypertension and anemia. She is not on anticoagulation because of history of anemia. Amiodarone was discontinued in the past at her request because she thought it was causing pulmonary side effects. She had an episode of atrial fibrillation back in February by her report that converted spontaneously. Over the past 1-1/2 weeks she has noticed increasing palpitations and fatigue. There is some dyspnea on exertion as well. No orthopnea, PND or pedal edema. She also notes discomfort in her throat and epigastric area that improves with tums. Her symptoms persisted and  she presented to the emergency room. Electrocardiogram initially showed probable atrial flutter with a rapid ventricular response and mild diffuse ST depression. On telemetry she then converted to atrial fibrillation. Plan to admit to telemetry. Control heart rate with IV Cardizem. Hopefully she will convert spontaneously. Options for anti-rhythmic therapy are limited. She has requested previously not to be on amiodarone as she felt it caused pulmonary side effects. She has structural heart disease and therefore would not be a good candidate for class IC agents. Doubt she would be a good candidate for tikosyn given low GFR. She also has been felt not to be a candidate for anticoagulation given history of blood loss anemia. We therefore cannot proceed with cardioversion. Also allergic to ASA. We will plan to repeat her echocardiogram for LV function and aortic stenosis. Check TSH. She has vague epigastric and throat discomfort. Cycle enzymes. Kirk Ruths

## 2014-06-21 NOTE — ED Notes (Signed)
MD at bedside. ED MD

## 2014-06-21 NOTE — ED Notes (Addendum)
Dr.Tegeler made aware that after IV Cardizem pts arm above IV is red and appears irritated in a line appearing to tract with pts vein. Dr.Tegeler states to hold Cardizem drip at this time.

## 2014-06-22 ENCOUNTER — Ambulatory Visit (HOSPITAL_COMMUNITY): Payer: Medicare Other

## 2014-06-22 DIAGNOSIS — I4891 Unspecified atrial fibrillation: Secondary | ICD-10-CM

## 2014-06-22 LAB — LIPID PANEL
CHOLESTEROL: 134 mg/dL (ref 0–200)
HDL: 33 mg/dL — AB (ref >40)
LDL CALC: 88 mg/dL (ref 0–99)
TRIGLYCERIDES: 66 mg/dL (ref <150)
Total CHOL/HDL Ratio: 4.1 RATIO
VLDL: 13 mg/dL (ref 0–40)

## 2014-06-22 LAB — HEMOGLOBIN A1C
Hgb A1c MFr Bld: 5.4 % (ref 4.8–5.6)
MEAN PLASMA GLUCOSE: 108 mg/dL

## 2014-06-22 LAB — BASIC METABOLIC PANEL
Anion gap: 5 (ref 5–15)
BUN: 17 mg/dL (ref 6–20)
CO2: 28 mmol/L (ref 22–32)
Calcium: 8.5 mg/dL — ABNORMAL LOW (ref 8.9–10.3)
Chloride: 105 mmol/L (ref 101–111)
Creatinine, Ser: 0.95 mg/dL (ref 0.44–1.00)
GFR calc non Af Amer: 51 mL/min — ABNORMAL LOW (ref >60)
GFR, EST AFRICAN AMERICAN: 59 mL/min — AB (ref >60)
GLUCOSE: 95 mg/dL (ref 65–99)
Potassium: 3.5 mmol/L (ref 3.5–5.1)
Sodium: 138 mmol/L (ref 135–145)

## 2014-06-22 LAB — TROPONIN I
Troponin I: <0.03 ng/mL (ref <0.031)
Troponin I: <0.03 ng/mL (ref <0.031)

## 2014-06-22 MED ORDER — LANSOPRAZOLE 15 MG PO TBDP
30.0000 mg | ORAL_TABLET | Freq: Every day | ORAL | Status: DC
  Administered 2014-06-22 – 2014-06-28 (×7): 30 mg via ORAL
  Filled 2014-06-22 (×8): qty 2

## 2014-06-22 MED ORDER — FUROSEMIDE 20 MG PO TABS
10.0000 mg | ORAL_TABLET | Freq: Every day | ORAL | Status: DC
  Administered 2014-06-23 – 2014-06-29 (×7): 10 mg via ORAL
  Filled 2014-06-22 (×7): qty 0.5

## 2014-06-22 NOTE — Evaluation (Signed)
Physical Therapy Evaluation Patient Details Name: Anna Elliott MRN: [id number] DOB: 01-10-1922 Today's Date: 06/22/2014   History of Present Illness  Patient is a 81 yo female admitted 06/21/14 with Afib with RVR.  PMH:  HTN, anemia, PAF, CHF, macular degeneration  Clinical Impression  Patient presents at baseline regarding functional mobility.  Patient has 24 hour assist at home.  No acute PT needs identified - PT will sign off.    Follow Up Recommendations No PT follow up;Supervision/Assistance - 24 hour    Equipment Recommendations  None recommended by PT    Recommendations for Other Services       Precautions / Restrictions Precautions Precautions: None Restrictions Weight Bearing Restrictions: No      Mobility  Bed Mobility Overal bed mobility: Modified Independent             General bed mobility comments: Increased time  Transfers Overall transfer level: Modified independent Equipment used: None             General transfer comment: Good balance in standing.  Ambulation/Gait Ambulation/Gait assistance: Min guard Ambulation Distance (Feet): 86 Feet Assistive device: None Gait Pattern/deviations: Step-through pattern;Decreased stride length;Staggering left;Staggering right   Gait velocity interpretation: at or above normal speed for age/gender General Gait Details: Patient with slightly unsteady gait, staggering to both sides.  Patient able to self-correct with no loss of balance.  (Patient reports this is due to her decreased vision.)  Patient with no h/o falls at home.  Stairs            Wheelchair Mobility    Modified Rankin (Stroke Patients Only)       Balance                                             Pertinent Vitals/Pain Pain Assessment: No/denies pain  Resting HR - 98-101 HR with ambulation - max 118.    Home Living Family/patient expects to be discharged to:: Private residence Living Arrangements:  Children Available Help at Discharge: Family;Available 24 hours/day Type of Home: House Home Access: Stairs to enter Entrance Stairs-Rails: None Entrance Stairs-Number of Steps: 1 Home Layout: One level Home Equipment: Cane - single point;Shower seat      Prior Function Level of Independence: Independent;Needs assistance   Gait / Transfers Assistance Needed: Independent with gait.  ADL's / Homemaking Assistance Needed: Independent with bathing/dressing.  Daughter assists with meals and provides housekeeping        Hand Dominance        Extremity/Trunk Assessment   Upper Extremity Assessment: Generalized weakness           Lower Extremity Assessment: Generalized weakness         Communication   Communication: No difficulties  Cognition Arousal/Alertness: Awake/alert Behavior During Therapy: WFL for tasks assessed/performed Overall Cognitive Status: Within Functional Limits for tasks assessed                      General Comments      Exercises        Assessment/Plan    PT Assessment Patent does not need any further PT services  PT Diagnosis Abnormality of gait;Generalized weakness   PT Problem List    PT Treatment Interventions     PT Goals (Current goals can be found in the Care Plan section) Acute Rehab PT Goals PT  Goal Formulation: All assessment and education complete, DC therapy    Frequency     Barriers to discharge        Co-evaluation               End of Session Equipment Utilized During Treatment: Gait belt Activity Tolerance: Patient tolerated treatment well Patient left: in bed;with call bell/phone within reach Nurse Communication: Mobility status         Time: 1850-1908 PT Time Calculation (min) (ACUTE ONLY): 18 min   Charges:   PT Evaluation $Initial PT Evaluation Tier I: 1 Procedure     PT G CodesDespina Pole July 13, 2014, 8:32 PM Carita Pian. Sanjuana Kava, [place] Pager  [phone]

## 2014-06-22 NOTE — Progress Notes (Signed)
  Echocardiogram 2D Echocardiogram has been performed.  Anna Elliott 06/22/2014, 10:58 AM

## 2014-06-22 NOTE — Progress Notes (Signed)
Patient: Anna Elliott / Admit Date: 06/21/2014 / Date of Encounter: 06/22/2014, 9:15 AM   Subjective: No chest pain or SOB. Feeling better. Echo being done in room.   Objective: Telemetry: appears to be atrial flutter with CVR Physical Exam: Blood pressure 131/82, pulse 89, temperature 98.3 F (36.8 C), temperature source Oral, resp. rate 18, height 5\' 5"  (1.651 m), weight 124 lb 1.6 oz (56.291 kg), SpO2 95 %. General: Well developed, well nourished WF, in no acute distress. Head: Normocephalic, atraumatic, sclera non-icteric, no xanthomas, nares are without discharge. Neck: Negative for carotid bruits. JVP not elevated. Lungs: Clear bilaterally to auscultation without wheezes, rales, or rhonchi. Breathing is unlabored. Heart: Irregularly irregular S1 S2 without murmurs, rubs, or gallops.  Abdomen: Soft, non-tender, non-distended with normoactive bowel sounds. No rebound/guarding. Extremities: No clubbing or cyanosis. No edema. Distal pedal pulses are 2+ and equal bilaterally. Neuro: Alert and oriented X 3. Moves all extremities spontaneously. Psych:  Responds to questions appropriately with a normal affect.   Intake/Output Summary (Last 24 hours) at 06/22/14 0915 Last data filed at 06/22/14 0905  Gross per 24 hour  Intake    840 ml  Output   1000 ml  Net   -160 ml    Inpatient Medications:  . cyanocobalamin  500 mcg Oral Daily  . diltiazem  10 mg Intravenous Once  . diltiazem  90 mg Oral 3 times per day  . diphenhydrAMINE  25 mg Intravenous Once  . furosemide  20 mg Oral Daily  . pantoprazole sodium  40 mg Oral Daily  . zolpidem  5 mg Oral QHS   Infusions:  . diltiazem (CARDIZEM) infusion Stopped (06/21/14 1538)    Labs:  Recent Labs  06/21/14 1506 06/22/14 0451  NA 138 138  K 4.0 3.5  CL 102 105  CO2 26 28  GLUCOSE 179* 95  BUN 21* 17  CREATININE 1.18* 0.95  CALCIUM 9.0 8.5*    Recent Labs  06/21/14 1506  WBC 6.2  NEUTROABS 5.1  HGB 10.9*  HCT  33.0*  MCV 89.7  PLT 189    Recent Labs  06/21/14 1903 06/22/14 0005 06/22/14 0451  TROPONINI <0.03 <0.03 <0.03   Invalid input(s): POCBNP  Recent Labs  06/21/14 1903  HGBA1C 5.4     Radiology/Studies:  Dg Chest Portable 1 View  06/21/2014   CLINICAL DATA:  Tachycardia on off for 1 week. Aching across back of neck. Hypertension.  EXAM: PORTABLE CHEST - 1 VIEW  COMPARISON:  06/19/2013  FINDINGS: Mild hyperinflation. Heart is mildly enlarged. No confluent airspace opacities or effusions. No acute bony abnormality.  IMPRESSION: Mild hyperinflation and cardiomegaly.  No active disease.   Electronically Signed   By: Rolm Baptise M.D.   On: 06/21/2014 16:10     Assessment and Plan  1. Atrial flutter with RVR - doing better on increased oral diltiazem dose. TSH normal. Rate control being pursued rather than restoration of sinus due to not being on anticoagulation because of her history of iron deficiency anemia and previous severe blood loss. 2. Paroxysmal atrial fibrillation - CHADSVASC 5 but not on systemic anticoagulation since 2015 due to iron deficiency anemia.  3. Chest discomfort - unclear if GI vs cardiac in nature. H/o hiatal hernia/GERD. Troponins negative. May have been r/t rapid rate. 4. Chronic diastolic heart failure - stable 5. HTN 6. Chronic anemia 7. Moderate AS - repeat echo pending 8. GERD/hiatal hernia 9. History of rheumatic fever 10. Recent acute renal insufficiency, appears  stable  May be candidate for discharge today. Will discuss dilt dose with MD since she was on TID med at home as well (never consolidated). She was also only on 10mg  daily of Lasix at home, on 20mg  here.  Signed, Melina Copa PA-C Pager: [phone]   As above, patient seen and examined. She has had no further chest discomfort. She denies dyspnea. Her Cardizem was increased to 90 mg by mouth every 8 hours. Her rate is much improved. We will await results of echocardiogram. TSH normal.  Ambulate today and if heart rate controlled DC tomorrow morning. She has not been felt to be a candidate for anticoagulation previously as a cause significant anemia and GI blood loss. She has an allergy to aspirin. Therefore cardioversion is not an option. Plan rate control if possible. Note she has not had further chest pain. Enzymes negative. Will not plan further ischemia evaluation. Follow-up Dr. Mare Ferrari following discharge. Kirk Ruths

## 2014-06-22 NOTE — Plan of Care (Signed)
Problem: Phase I Progression Outcomes Goal: Anticoagulation Therapy per MD order On hold due to anemia Goal: Pain controlled with appropriate interventions Outcome: Completed/Met Date Met:  06/22/14 Patient had no chest pain or chest discomfort throughout the night.

## 2014-06-23 DIAGNOSIS — I4891 Unspecified atrial fibrillation: Secondary | ICD-10-CM

## 2014-06-23 LAB — URINALYSIS, ROUTINE W REFLEX MICROSCOPIC
Glucose, UA: NEGATIVE mg/dL
HGB URINE DIPSTICK: NEGATIVE
Ketones, ur: NEGATIVE mg/dL
Leukocytes, UA: NEGATIVE
NITRITE: NEGATIVE
PH: 5.5 (ref 5.0–8.0)
Protein, ur: NEGATIVE mg/dL
Specific Gravity, Urine: 1.022 (ref 1.005–1.030)
Urobilinogen, UA: 1 mg/dL (ref 0.0–1.0)

## 2014-06-23 MED ORDER — DILTIAZEM HCL 30 MG PO TABS
30.0000 mg | ORAL_TABLET | Freq: Once | ORAL | Status: AC
  Administered 2014-06-23: 30 mg via ORAL
  Filled 2014-06-23: qty 1

## 2014-06-23 MED ORDER — DILTIAZEM HCL ER COATED BEADS 180 MG PO CP24
180.0000 mg | ORAL_CAPSULE | Freq: Every day | ORAL | Status: DC
  Administered 2014-06-24 – 2014-06-26 (×3): 180 mg via ORAL
  Filled 2014-06-23 (×4): qty 1

## 2014-06-23 MED ORDER — DILTIAZEM HCL ER COATED BEADS 120 MG PO CP24
120.0000 mg | ORAL_CAPSULE | Freq: Every evening | ORAL | Status: DC
  Administered 2014-06-23 – 2014-06-24 (×2): 120 mg via ORAL
  Filled 2014-06-23 (×3): qty 1

## 2014-06-23 NOTE — Progress Notes (Addendum)
HR 120s-140s Pt. Lying in bed, paged Cecilie Kicks, PA orders to give cardizem early and call if no results. Pt and family educated.

## 2014-06-23 NOTE — Progress Notes (Signed)
Pt. HR maintaining 140-150s MD notified awaiting orders. Pt and daughter educated on plan of care.

## 2014-06-23 NOTE — Care Management Note (Signed)
Case Management Note  Patient Details  Name: Anna Elliott MRN: [id number] Date of Birth: 1921/01/29  Subjective/Objective:                   a-fib with RVR. Action/Plan:  discharge planning Expected Discharge Date:                 Expected Discharge Plan:  [place]  In-House Referral:  Clinical Social Work  Discharge planning Services  CM Consult  Post Acute Care Choice:    Choice offered to:     DME Arranged:    DME Agency:     HH Arranged:    [place] Agency:     Status of Service:  Completed, signed off  Medicare Important Message Given:    Date Medicare IM Given:    Medicare IM give by:    Date Additional Medicare IM Given:    Additional Medicare Important Message give by:     If discussed at [place] of Stay Meetings, dates discussed:    Additional Comments: Cm met with pt and daughter, [name] who is pt's caretaker and POA and gave a IT sales professional List.  [name] asked to step out to the hallway and explained she nor her mother had the money for the PD. [name] then, tearfully stated she was no longer physically able to continue to care for her mother and when I suggensted we might be looking at a change in living environment, she tearfully stated she had promised her father (the pt's husband) she would never have her mother "put away."  CM offered to make a call to CSW to meet with [name], to explain her options for SNFs.  [name] has requested CSW discuss with her only then together with pt if SNF is a viable option.  CM called CSW, Dysheeka and relayed the sensitivities and Dysheeka states she will be happy to call [name] and discuss the options.  No other CM needs were communicated at this time. Dellie Catholic, RN 06/23/2014, 11:28 AM

## 2014-06-23 NOTE — Progress Notes (Signed)
Primary cardiologist: Dr. Darlin Coco  Seen for followup: Atrial fibrillation  Subjective:    Patient still aware of palpitations, short of breath with activity. Voiced concern about going home.  Objective:   Temp:  [97.8 F (36.6 C)-98.3 F (36.8 C)] 98 F (36.7 C) (05/28 1004) Pulse Rate:  [75-141] 85 (05/28 1004) Resp:  [18] 18 (05/28 1004) BP: (130-148)/(58-82) 130/58 mmHg (05/28 1004) SpO2:  [97 %-98 %] 97 % (05/28 1004) Weight:  [124 lb 8 oz (56.473 kg)] 124 lb 8 oz (56.473 kg) (05/28 0500) Last BM Date: 06/21/14  Filed Weights   06/21/14 1701 06/22/14 0451 06/23/14 0500  Weight: 124 lb 11.2 oz (56.564 kg) 124 lb 1.6 oz (56.291 kg) 124 lb 8 oz (56.473 kg)    Intake/Output Summary (Last 24 hours) at 06/23/14 1134 Last data filed at 06/23/14 1497  Gross per 24 hour  Intake    480 ml  Output   1650 ml  Net  -1170 ml    Telemetry: Atrial fibrillation with intermittent RVR into the 120s still noted.  Exam:  General: Elderly woman in no distress.  Lungs: Decreased breath sounds, nonlabored.  Cardiac: Irregularly irregular with 3/6 systolic murmur at the base.  Extremities: No pitting edema.  Lab Results:  Basic Metabolic Panel:  Recent Labs Lab 06/21/14 1506 06/22/14 0451  NA 138 138  K 4.0 3.5  CL 102 105  CO2 26 28  GLUCOSE 179* 95  BUN 21* 17  CREATININE 1.18* 0.95  CALCIUM 9.0 8.5*    CBC:  Recent Labs Lab 06/21/14 1506  WBC 6.2  HGB 10.9*  HCT 33.0*  MCV 89.7  PLT 189    Cardiac Enzymes:  Recent Labs Lab 06/21/14 1903 06/22/14 0005 06/22/14 0451  TROPONINI <0.03 <0.03 <0.03    Echocardiogram 06/22/2014: Study Conclusions  - Left ventricle: There is upper septal thickening, but no LVOT obstruction. The cavity size was normal. Wall thickness was increased in a pattern of mild LVH. The estimated ejection fraction was 60%. Wall motion was normal; there were no regional wall motion abnormalities. - Aortic  valve: Significant thickening and calcification of the leaflets. However, AS is only moderate. There was trivial regurgitation. - Mitral valve: Moderately calcified, moderately thickened annulus. There was moderate regurgitation. - Left atrium: The atrium was severely dilated. - Right ventricle: Systolic function was mildly to moderately reduced.   Medications:   Scheduled Medications: . cyanocobalamin  500 mcg Oral Daily  . diltiazem  10 mg Intravenous Once  . diltiazem  90 mg Oral 3 times per day  . diphenhydrAMINE  25 mg Intravenous Once  . furosemide  10 mg Oral Daily  . lansoprazole  30 mg Oral Q1200  . zolpidem  5 mg Oral QHS      PRN Medications:  acetaminophen, ondansetron (ZOFRAN) IV   Assessment:   1. Persistent atrial fibrillation/flutter with intermittent RVR. Hospital course and evaluation by Dr. Stanford Breed reviewed. Patient is not anticoagulated with history of iron deficiency anemia and severe blood loss in the past. She also reports prior intolerance to amiodarone. Current regimen has included divided dose Cardizem, although heart rate control is still not adequate.  2. Chronic diastolic heart failure, recent follow-up echocardiogram shows LVEF 60%.  3. Moderate aortic stenosis.  4. Symptomatic GERD and hiatal hernia.  5. Recent mild renal insufficiency, creatinine now 0.9.   Plan/Discussion:    Discussed with patient and also met with the patient's daughter. We have decided to defer  discharge and modify diltiazem to try and obtain better heart rate control since she remains symptomatic, still has evidence of RVR on ambulation. Cardizem will be changed to Cardizem CD 180 mg in the morning and 120 mg in the evening. Hopefully this regimen will provide better overlap during the day, and also represents an increase in total dosage compared to her current regimen.   Satira Sark, M.D., F.A.C.C.

## 2014-06-24 DIAGNOSIS — N183 Chronic kidney disease, stage 3 unspecified: Secondary | ICD-10-CM

## 2014-06-24 DIAGNOSIS — I5032 Chronic diastolic (congestive) heart failure: Secondary | ICD-10-CM

## 2014-06-24 DIAGNOSIS — I481 Persistent atrial fibrillation: Secondary | ICD-10-CM

## 2014-06-24 LAB — BASIC METABOLIC PANEL
ANION GAP: 8 (ref 5–15)
BUN: 19 mg/dL (ref 6–20)
CALCIUM: 8.6 mg/dL — AB (ref 8.9–10.3)
CO2: 29 mmol/L (ref 22–32)
CREATININE: 1.01 mg/dL — AB (ref 0.44–1.00)
Chloride: 101 mmol/L (ref 101–111)
GFR calc non Af Amer: 47 mL/min — ABNORMAL LOW (ref >60)
GFR, EST AFRICAN AMERICAN: 55 mL/min — AB (ref >60)
GLUCOSE: 111 mg/dL — AB (ref 65–99)
Potassium: 3.4 mmol/L — ABNORMAL LOW (ref 3.5–5.1)
SODIUM: 138 mmol/L (ref 135–145)

## 2014-06-24 MED ORDER — DIGOXIN 125 MCG PO TABS
0.1250 mg | ORAL_TABLET | Freq: Every day | ORAL | Status: DC
  Administered 2014-06-25 – 2014-06-29 (×5): 0.125 mg via ORAL
  Filled 2014-06-24 (×5): qty 1

## 2014-06-24 MED ORDER — DIGOXIN 0.25 MG/ML IJ SOLN
0.2500 mg | Freq: Once | INTRAMUSCULAR | Status: AC
  Administered 2014-06-24: 0.25 mg via INTRAVENOUS
  Filled 2014-06-24 (×2): qty 1

## 2014-06-24 NOTE — Progress Notes (Signed)
Patient Name: Anna Elliott Date of Encounter: 06/24/2014     Principal Problem:   Atrial fibrillation Active Problems:   Essential hypertension   Moderate aortic stenosis   Chronic diastolic CHF (congestive heart failure)   Anemia   CKD (chronic kidney disease), stage III    SUBJECTIVE  Still with symptomatic palpitations in the setting of variable rate control of afib.  90's to low 100's @ rest but up into 140's with any amt of activity.  No chest pain or sob.  CURRENT MEDS . cyanocobalamin  500 mcg Oral Daily  . diltiazem  120 mg Oral QPM  . diltiazem  180 mg Oral QAC breakfast  . diphenhydrAMINE  25 mg Intravenous Once  . furosemide  10 mg Oral Daily  . lansoprazole  30 mg Oral Q1200  . zolpidem  5 mg Oral QHS    OBJECTIVE  Filed Vitals:   06/23/14 2010 06/24/14 0142 06/24/14 0500 06/24/14 0529  BP: 137/75 133/59 96/54 130/60  Pulse: 140 94 115 126  Temp: 98.1 F (36.7 C)  98 F (36.7 C)   TempSrc: Oral  Oral   Resp: 18  16   Height:      Weight:   122 lb 9.6 oz (55.611 kg)   SpO2: 98%  97%     Intake/Output Summary (Last 24 hours) at 06/24/14 0828 Last data filed at 06/24/14 0600  Gross per 24 hour  Intake    700 ml  Output    600 ml  Net    100 ml   Filed Weights   06/22/14 0451 06/23/14 0500 06/24/14 0500  Weight: 124 lb 1.6 oz (56.291 kg) 124 lb 8 oz (56.473 kg) 122 lb 9.6 oz (55.611 kg)    PHYSICAL EXAM  General: Pleasant, NAD. Neuro: Alert and oriented X 3. Moves all extremities spontaneously. Psych: Normal affect. HEENT:  Normal  Neck: Supple without bruits or JVD. Lungs:  Resp regular and unlabored, CTA. Heart: IR, IR, tachy, no s3, s4.  2/6 SEM @ USB. Abdomen: Soft, non-tender, non-distended, BS + x 4.  Extremities: No clubbing, cyanosis or edema. DP/PT/Radials 1+ and equal bilaterally.  Accessory Clinical Findings  CBC  Recent Labs  06/21/14 1506  WBC 6.2  NEUTROABS 5.1  HGB 10.9*  HCT 33.0*  MCV 89.7  PLT 655   Basic  Metabolic Panel  Recent Labs  06/22/14 0451 06/24/14 0615  NA 138 138  K 3.5 3.4*  CL 105 101  CO2 28 29  GLUCOSE 95 111*  BUN 17 19  CREATININE 0.95 1.01*  CALCIUM 8.5* 8.6*   Cardiac Enzymes  Recent Labs  06/21/14 1903 06/22/14 0005 06/22/14 0451  TROPONINI <0.03 <0.03 <0.03   Hemoglobin A1C  Recent Labs  06/21/14 1903  HGBA1C 5.4   Fasting Lipid Panel  Recent Labs  06/22/14 0451  CHOL 134  HDL 33*  LDLCALC 88  TRIG 66  CHOLHDL 4.1   Thyroid Function Tests  Recent Labs  06/21/14 1903  TSH 0.957    TELE  Afib, 90's to 140's.  2D Echocardiogram 5.27.2016  Study Conclusions  - Left ventricle: There is upper septal thickening, but no LVOT   obstruction. The cavity size was normal. Wall thickness was   increased in a pattern of mild LVH. The estimated ejection   fraction was 60%. Wall motion was normal; there were no regional   wall motion abnormalities. - Aortic valve: Significant thickening and calcification of the   leaflets. However,  AS is only moderate. There was trivial   regurgitation. - Mitral valve: Moderately calcified, moderately thickened annulus.   There was moderate regurgitation. - Left atrium: The atrium was severely dilated. - Right ventricle: Systolic function was mildly to moderately   reduced. _____________   Radiology/Studies  Dg Chest Portable 1 View  06/21/2014   CLINICAL DATA:  Tachycardia on off for 1 week. Aching across back of neck. Hypertension.  EXAM: PORTABLE CHEST - 1 VIEW  COMPARISON:  06/19/2013  FINDINGS: Mild hyperinflation. Heart is mildly enlarged. No confluent airspace opacities or effusions. No acute bony abnormality.  IMPRESSION: Mild hyperinflation and cardiomegaly.  No active disease.   Electronically Signed   By: Rolm Baptise M.D.   On: 06/21/2014 16:10    ASSESSMENT AND PLAN  1.  Persistent Afib/Fltter with intermittent RVR:  Diltiazem titrated but still with fairly variable rate control - 90's @  rest and up to 120's to 140's with activity.  She notes palpitations in her chest and neck and is uncomfortable.  No c/p, dyspnea, presyncope.  Difficult situation as she was previously intolerant of amio (? Lung issues), has structural heart dzs and thus is not a 1c candidate and has CKD III (w/ nl creat), likely making her a poor tikosyn candidate.  Regardless, she cannot be anticoagulated 2/2 IDA w/ h/o severe blood loss and has severe LA dil on echo, making maintenance of sinus rhythm highly unlikely.  Thus rate control remains our only current medical option. BP is soft @ times and thus it is unlikely that we'll be able to dilt further or add bb.  Could consider digoxin but would have to be cautious given GFR of 47.  Could consider EP eval for PPM (? Leadless) and AVN RFCA.  Will d/w Dr. Domenic Polite.  2.  Chronic diastolic CHF:  Nl ef by echo this admission.  Euvolemic on exam.  Wt down 2 lbs since admission on home dose of lasix (10 mg daily).  Minus 1.2L for admission.  BP stable.  HR variable as above.  3.  Essential HTN: stable.  4.  CKD III: stable.  5.  Moderate AS:  Stable by echo this admission.  Signed, Murray Hodgkins NP   Attending note:  Patient seen and examined. Agree with above assessment by Mr. Sharolyn Douglas NP. I made adjustments in Ms. Forman's heart rate control medications yesterday, transitioning her to twice daily Cardizem CD for overlap affect. This effect has probably not yet been realized, since she has only had 2 doses. She is comfortable today on examination with clear lungs, irregularly irregular heart rate, however her heart rate does easily increase with just sitting up to eat or walking around. We will plan to initiate digoxin today and continue her current dose of Cardizem CD. She is not yet ready for discharge.  Satira Sark, M.D., F.A.C.C.

## 2014-06-25 MED ORDER — POTASSIUM CHLORIDE CRYS ER 10 MEQ PO TBCR
10.0000 meq | EXTENDED_RELEASE_TABLET | Freq: Every day | ORAL | Status: DC
  Administered 2014-06-25 – 2014-06-29 (×5): 10 meq via ORAL
  Filled 2014-06-25 (×5): qty 1

## 2014-06-25 MED ORDER — ENOXAPARIN SODIUM 30 MG/0.3ML ~~LOC~~ SOLN: 30.0000 mg | SUBCUTANEOUS | Status: DC

## 2014-06-25 MED ORDER — DILTIAZEM HCL ER COATED BEADS 180 MG PO CP24
180.0000 mg | ORAL_CAPSULE | Freq: Every evening | ORAL | Status: DC
  Administered 2014-06-25: 180 mg via ORAL
  Filled 2014-06-25 (×2): qty 1

## 2014-06-25 NOTE — Progress Notes (Signed)
Patient Name: Anna Elliott Date of Encounter: 06/25/2014     Principal Problem:   Atrial fibrillation Active Problems:   Essential hypertension   Moderate aortic stenosis   Anemia   Chronic diastolic CHF (congestive heart failure)   CKD (chronic kidney disease), stage III    SUBJECTIVE  Denies any chest pain or dyspnea. Not aware of any forceful palpitations. Pulse still rapid at times. BP not low.  CURRENT MEDS . cyanocobalamin  500 mcg Oral Daily  . digoxin  0.125 mg Oral Daily  . diltiazem  120 mg Oral QPM  . diltiazem  180 mg Oral QAC breakfast  . diphenhydrAMINE  25 mg Intravenous Once  . furosemide  10 mg Oral Daily  . lansoprazole  30 mg Oral Q1200  . zolpidem  5 mg Oral QHS    OBJECTIVE  Filed Vitals:   06/24/14 0529 06/24/14 1417 06/24/14 2207 06/25/14 0605  BP: 130/60 126/58 137/60 151/64  Pulse: 126 77 88 77  Temp:  97.7 F (36.5 C) 98.2 F (36.8 C) 98.2 F (36.8 C)  TempSrc:  Oral Oral Oral  Resp:    18  Height:      Weight:    122 lb 4.8 oz (55.475 kg)  SpO2:  97% 95% 95%    Intake/Output Summary (Last 24 hours) at 06/25/14 0816 Last data filed at 06/25/14 0608  Gross per 24 hour  Intake    460 ml  Output   1200 ml  Net   -740 ml   Filed Weights   06/23/14 0500 06/24/14 0500 06/25/14 0605  Weight: 124 lb 8 oz (56.473 kg) 122 lb 9.6 oz (55.611 kg) 122 lb 4.8 oz (55.475 kg)    PHYSICAL EXAM  General: Pleasant, NAD. Neuro: Alert and oriented X 3. Moves all extremities spontaneously. Psych: Normal affect. HEENT:  Normal  Neck: Supple without bruits or JVD. Lungs:  Resp regular and unlabored, CTA. Heart: Irregular rhythm. Grade 2/6 systolic murmur at apex and LSE Abdomen: Soft, non-tender, non-distended, BS + x 4.  Extremities: No clubbing, cyanosis or edema. DP/PT/Radials 2+ and equal bilaterally.  Accessory Clinical Findings  CBC No results for input(s): WBC, NEUTROABS, HGB, HCT, MCV, PLT in the last 72 hours. Basic Metabolic  Panel  Recent Labs  06/24/14 0615  NA 138  K 3.4*  CL 101  CO2 29  GLUCOSE 111*  BUN 19  CREATININE 1.01*  CALCIUM 8.6*   Liver Function Tests No results for input(s): AST, ALT, ALKPHOS, BILITOT, PROT, ALBUMIN in the last 72 hours. No results for input(s): LIPASE, AMYLASE in the last 72 hours. Cardiac Enzymes No results for input(s): CKTOTAL, CKMB, CKMBINDEX, TROPONINI in the last 72 hours. BNP Invalid input(s): POCBNP D-Dimer No results for input(s): DDIMER in the last 72 hours. Hemoglobin A1C No results for input(s): HGBA1C in the last 72 hours. Fasting Lipid Panel No results for input(s): CHOL, HDL, LDLCALC, TRIG, CHOLHDL, LDLDIRECT in the last 72 hours. Thyroid Function Tests No results for input(s): TSH, T4TOTAL, T3FREE, THYROIDAB in the last 72 hours.  Invalid input(s): FREET3  TELE  Atrial fibrillation.    2D Echo 06/22/14: - Left ventricle: There is upper septal thickening, but no LVOT obstruction. The cavity size was normal. Wall thickness was increased in a pattern of mild LVH. The estimated ejection fraction was 60%. Wall motion was normal; there were no regional wall motion abnormalities. - Aortic valve: Significant thickening and calcification of the leaflets. However, AS is only moderate. There  was trivial regurgitation. - Mitral valve: Moderately calcified, moderately thickened annulus. There was moderate regurgitation. - Left atrium: The atrium was severely dilated. - Right ventricle: Systolic function was mildly to moderately reduced. Radiology/Studies  Dg Chest Portable 1 View  06/21/2014   CLINICAL DATA:  Tachycardia on off for 1 week. Aching across back of neck. Hypertension.  EXAM: PORTABLE CHEST - 1 VIEW  COMPARISON:  06/19/2013  FINDINGS: Mild hyperinflation. Heart is mildly enlarged. No confluent airspace opacities or effusions. No acute bony abnormality.  IMPRESSION: Mild hyperinflation and cardiomegaly.  No active  disease.   Electronically Signed   By: Rolm Baptise M.D.   On: 06/21/2014 16:10    ASSESSMENT AND PLAN 1. Persistent Afib/Fltter with intermittent RVR: now on higher dose of diltiazem and on digoxin. Rate starting to come under control. Has not walked in hall yet. Will increase diltiazem to 180 mg BID 2. Chronic diastolic CHF: Nl ef by echo this admission. Euvolemic on exam.  3. Essential HTN: stable.  4. CKD III: stable.  5. Moderate AS: Stable by echo this admission  6. Hypokalemia. Will replete. Recheck BMET in am  Signed, Darlin Coco MD

## 2014-06-25 NOTE — Progress Notes (Signed)
Patient ambulated in hallway. Patient tolerated it well. No increase in heart rate. Patient was slightly SOB. Will continue to monitor for further changes in condition.

## 2014-06-26 DIAGNOSIS — I350 Nonrheumatic aortic (valve) stenosis: Secondary | ICD-10-CM

## 2014-06-26 DIAGNOSIS — I10 Essential (primary) hypertension: Secondary | ICD-10-CM

## 2014-06-26 LAB — CBC
HCT: 30.5 % — ABNORMAL LOW (ref 36.0–46.0)
Hemoglobin: 9.8 g/dL — ABNORMAL LOW (ref 12.0–15.0)
MCH: 29.2 pg (ref 26.0–34.0)
MCHC: 32.1 g/dL (ref 30.0–36.0)
MCV: 90.8 fL (ref 78.0–100.0)
Platelets: 177 10*3/uL (ref 150–400)
RBC: 3.36 MIL/uL — AB (ref 3.87–5.11)
RDW: 14.9 % (ref 11.5–15.5)
WBC: 4.8 10*3/uL (ref 4.0–10.5)

## 2014-06-26 LAB — BASIC METABOLIC PANEL
Anion gap: 8 (ref 5–15)
BUN: 21 mg/dL — AB (ref 6–20)
CHLORIDE: 102 mmol/L (ref 101–111)
CO2: 29 mmol/L (ref 22–32)
CREATININE: 0.97 mg/dL (ref 0.44–1.00)
Calcium: 8.6 mg/dL — ABNORMAL LOW (ref 8.9–10.3)
GFR calc Af Amer: 57 mL/min — ABNORMAL LOW (ref >60)
GFR calc non Af Amer: 50 mL/min — ABNORMAL LOW (ref >60)
GLUCOSE: 105 mg/dL — AB (ref 65–99)
POTASSIUM: 3.8 mmol/L (ref 3.5–5.1)
Sodium: 139 mmol/L (ref 135–145)

## 2014-06-26 MED ORDER — DILTIAZEM HCL ER COATED BEADS 240 MG PO CP24
240.0000 mg | ORAL_CAPSULE | Freq: Every day | ORAL | Status: DC
  Administered 2014-06-27 (×2): 240 mg via ORAL
  Filled 2014-06-26 (×2): qty 1

## 2014-06-26 NOTE — Progress Notes (Signed)
Patient Name: Anna Elliott Date of Encounter: 06/26/2014  Principal Problem:   Atrial fibrillation Active Problems:   Essential hypertension   Moderate aortic stenosis   Anemia   Chronic diastolic CHF (congestive heart failure)   CKD (chronic kidney disease), stage III  SUBJECTIVE  Denies chest pain, SOB, palpitation or dizziness. Feels better.   CURRENT MEDS  . cyanocobalamin  500 mcg Oral Daily  . digoxin  0.125 mg Oral Daily  . diltiazem  180 mg Oral QAC breakfast  . diltiazem  180 mg Oral QPM  . diphenhydrAMINE  25 mg Intravenous Once  . furosemide  10 mg Oral Daily  . lansoprazole  30 mg Oral Q1200  . potassium chloride  10 mEq Oral Daily  . zolpidem  5 mg Oral QHS   OBJECTIVE  Filed Vitals:   06/25/14 1330 06/25/14 1732 06/25/14 2125 06/26/14 0621  BP: 118/57 142/61 150/80 126/63  Pulse: 82 85 97 88  Temp: 98.4 F (36.9 C)  98 F (36.7 C) 97.7 F (36.5 C)  TempSrc: Oral  Oral Oral  Resp: 18  17 16   Height:      Weight:    123 lb 1.6 oz (55.838 kg)  SpO2: 100%  98% 97%    Intake/Output Summary (Last 24 hours) at 06/26/14 1046 Last data filed at 06/26/14 0812  Gross per 24 hour  Intake   1140 ml  Output    300 ml  Net    840 ml   Filed Weights   06/24/14 0500 06/25/14 0605 06/26/14 0621  Weight: 122 lb 9.6 oz (55.611 kg) 122 lb 4.8 oz (55.475 kg) 123 lb 1.6 oz (55.838 kg)    PHYSICAL EXAM  General: Pleasant, NAD. Neuro: Alert and oriented X 3. Moves all extremities spontaneously. Psych: Normal affect. HEENT:  Normal  Neck: Supple without bruits or JVD. Lungs:  Resp regular and unlabored, CTA. Heart: irregularly irregular. Systolic 2/6 murmurs. Abdomen: Soft, non-tender, non-distended, BS + x 4.  Extremities: No clubbing, cyanosis or edema. DP/PT/Radials 2+ and equal bilaterally.  Accessory Clinical Findings  CBC  Recent Labs  06/26/14 0455  WBC 4.8  HGB 9.8*  HCT 30.5*  MCV 90.8  PLT 160   Basic Metabolic Panel  Recent Labs  06/24/14 0615 06/26/14 0455  NA 138 139  K 3.4* 3.8  CL 101 102  CO2 29 29  GLUCOSE 111* 105*  BUN 19 21*  CREATININE 1.01* 0.97  CALCIUM 8.6* 8.6*   TELE  afib at rate of 60s. Transient bradycardia at rates of 40s.   Radiology/Studies  Dg Chest Portable 1 View  06/21/2014   CLINICAL DATA:  Tachycardia on off for 1 week. Aching across back of neck. Hypertension.  EXAM: PORTABLE CHEST - 1 VIEW  COMPARISON:  06/19/2013  FINDINGS: Mild hyperinflation. Heart is mildly enlarged. No confluent airspace opacities or effusions. No acute bony abnormality.  IMPRESSION: Mild hyperinflation and cardiomegaly.  No active disease.   Electronically Signed   By: Rolm Baptise M.D.   On: 06/21/2014 16:10    ASSESSMENT AND PLAN   1. Persistent Afib/Fltter with intermittent RVR -Now on diltiazem CD 180mg  BID and digoxin 0.125mg  daily. Evening dose of diltiazem added yesterday evening. Overnight her rate was well controlled, however she had transient bradycardia at rates of low 40s. Continue to monitor now, might need to back up on evening dose.  - No systemic anticoagulation since 2015 due to iron deficiency anemia. CHA2DS2-Vasc score 5 (age, female, HTN,  HF).  2. Chronic diastolic CHF  - Echo 4/36/06 LV EF of 60%, mild LVH, no WM abnormality. Euvolemic on exam. - Continue lasix 10mg  daily.   3. Essential HTN - Stable and well controlled on current regimen.   4. CKD stage III - Stable.  5. Moderate AS  - Stable by echo this admission  6. Hypokalemia - Supplement given. Resolved. On daily K-Dur 30mEq.  Signed, Leanor Kail PA-C Pager [phone]   The patient was seen, examined and discussed with Bhagat,Bhavinkumar PA-C and I agree with the above.   Cardizem CD was added to her regimen yesterday, this lead to some bradycardia and mild dizziness, however she was on twice daily of long acting cardizem - so total of cardizem 360 mg po daily. I will change to 240 mg po daily.  Anticipated discharge tomorrow.  She appears euvolemic.  Dorothy Spark, MD, [facility] 06/26/2014

## 2014-06-26 NOTE — Progress Notes (Signed)
UR COMPLETED  

## 2014-06-27 MED ORDER — DILTIAZEM HCL ER COATED BEADS 360 MG PO CP24
360.0000 mg | ORAL_CAPSULE | Freq: Every day | ORAL | Status: DC
  Administered 2014-06-28: 360 mg via ORAL
  Filled 2014-06-27 (×2): qty 1

## 2014-06-27 MED ORDER — DILTIAZEM HCL ER 60 MG PO CP12
120.0000 mg | ORAL_CAPSULE | Freq: Once | ORAL | Status: DC
  Filled 2014-06-27: qty 2

## 2014-06-27 NOTE — Progress Notes (Signed)
Patient ambulated in hall no sob noted HR increased to 145 r A fib during ambulation with some occasional miss steps that would warrant evaluation  by Physical therapy. Patient placed in chair HR decreased to 117 afib.

## 2014-06-27 NOTE — Progress Notes (Signed)
Patient Name: Anna Elliott Date of Encounter: 06/27/2014  Principal Problem:   Atrial fibrillation Active Problems:   Essential hypertension   Moderate aortic stenosis   Anemia   Chronic diastolic CHF (congestive heart failure)   CKD (chronic kidney disease), stage III  SUBJECTIVE  Denies chest pain or SOB. Feels her heart is racing fast since walked in hallway this morning.   CURRENT MEDS  . cyanocobalamin  500 mcg Oral Daily  . digoxin  0.125 mg Oral Daily  . diltiazem  240 mg Oral QAC breakfast  . diphenhydrAMINE  25 mg Intravenous Once  . furosemide  10 mg Oral Daily  . lansoprazole  30 mg Oral Q1200  . potassium chloride  10 mEq Oral Daily  . zolpidem  5 mg Oral QHS   OBJECTIVE  Filed Vitals:   06/26/14 1110 06/26/14 1433 06/26/14 2149 06/27/14 0526  BP: 123/57 131/60 145/57 138/66  Pulse: 76 72 72 97  Temp:  97.7 F (36.5 C) 97.9 F (36.6 C)   TempSrc:  Oral Oral   Resp:  18 18 17   Height:      Weight:    123 lb 4.8 oz (55.929 kg)  SpO2:  100% 96% 97%    Intake/Output Summary (Last 24 hours) at 06/27/14 0933 Last data filed at 06/27/14 0600  Gross per 24 hour  Intake   1320 ml  Output    850 ml  Net    470 ml   Filed Weights   06/25/14 0605 06/26/14 0621 06/27/14 0526  Weight: 122 lb 4.8 oz (55.475 kg) 123 lb 1.6 oz (55.838 kg) 123 lb 4.8 oz (55.929 kg)    PHYSICAL EXAM  General: Pleasant, NAD. Neuro: Alert and oriented X 3. Moves all extremities spontaneously. Psych: Normal affect. HEENT:  Normal  Neck: Supple without bruits. + JVD. Lungs:  Resp regular and unlabored, CTA. Heart: Irregular with high rate. Systolic 2/6 murmurs. Abdomen: Soft, non-tender, non-distended, BS + x 4.  Extremities: No clubbing, cyanosis or edema. DP/PT/Radials 2+ and equal bilaterally.  Accessory Clinical Findings  CBC  Recent Labs  06/26/14 0455  WBC 4.8  HGB 9.8*  HCT 30.5*  MCV 90.8  PLT 425   Basic Metabolic Panel  Recent Labs  06/26/14 0455    NA 139  K 3.8  CL 102  CO2 29  GLUCOSE 105*  BUN 21*  CREATININE 0.97  CALCIUM 8.6*   TELE  Afib. Since about 8am this morning her rate in 120-140s.   Radiology/Studies  Dg Chest Portable 1 View  06/21/2014   CLINICAL DATA:  Tachycardia on off for 1 week. Aching across back of neck. Hypertension.  EXAM: PORTABLE CHEST - 1 VIEW  COMPARISON:  06/19/2013  FINDINGS: Mild hyperinflation. Heart is mildly enlarged. No confluent airspace opacities or effusions. No acute bony abnormality.  IMPRESSION: Mild hyperinflation and cardiomegaly.  No active disease.   Electronically Signed   By: Rolm Baptise M.D.   On: 06/21/2014 16:10    ASSESSMENT AND PLAN   1. Persistent Afib/Fltter with intermittent RVR -Now on diltiazem CD 240mg  BID and digoxin 0.125mg  daily. Discontinued evening dose of Cardizem 180mg  yesterday due to transient bradycardia and mild dizziness.  Received diltiazem CD 240mg  today around 6am. Her rate constantly at rate on 120s and at times elevates to 140s since walked in hallway this morning around 8am. MD to decided further management.  - No systemic anticoagulation since 2015 due to iron deficiency anemia. CHA2DS2-Vasc score 5 (  age, female, HTN, HF). No ASA due to allergy. - Patient want to know that cardioversion is an option after few days of anticoagulation. Will defer to MD.   2. Chronic diastolic CHF  - Echo 01/26/73 LV EF of 60%, mild LVH, no WM abnormality. Euvolemic on exam, however she does have a mild JVD.  - Continue lasix 10mg  daily.   3. Essential HTN - Stable and well controlled on current regimen.   4. CKD stage III - Stable.  5. Moderate AS  - Stable by echo this admission  6. Hypokalemia - Resolved. Continue daily K-Dur 77mEq.  Signed, Leanor Kail PA-C Pager [phone]   The patient was seen, examined and discussed with Bhagat,Bhavinkumar PA-C and I agree with the above.   The patient remains tachycardic especially with minimal  activity, we will add extra cardizem 120 mg po SR tonight and switch to cardizem CD 360 mg po daily starting tomorrow.  She appears euvolemic.  Dorothy Spark, MD, [facility] 06/27/2014

## 2014-06-28 LAB — BASIC METABOLIC PANEL
ANION GAP: 10 (ref 5–15)
BUN: 18 mg/dL (ref 6–20)
CO2: 26 mmol/L (ref 22–32)
Calcium: 9.1 mg/dL (ref 8.9–10.3)
Chloride: 103 mmol/L (ref 101–111)
Creatinine, Ser: 0.97 mg/dL (ref 0.44–1.00)
GFR calc Af Amer: 57 mL/min — ABNORMAL LOW (ref >60)
GFR calc non Af Amer: 50 mL/min — ABNORMAL LOW (ref >60)
Glucose, Bld: 106 mg/dL — ABNORMAL HIGH (ref 65–99)
Potassium: 4.4 mmol/L (ref 3.5–5.1)
SODIUM: 139 mmol/L (ref 135–145)

## 2014-06-28 MED ORDER — DILTIAZEM HCL ER COATED BEADS 300 MG PO CP24
300.0000 mg | ORAL_CAPSULE | Freq: Every day | ORAL | Status: DC
  Administered 2014-06-29: 300 mg via ORAL
  Filled 2014-06-28 (×2): qty 1

## 2014-06-28 NOTE — Progress Notes (Signed)
Patient observed ambulating with Physical Therapy with use of  Walker with rolling wheels and demonstrated stable steady ambulation with use of walker

## 2014-06-28 NOTE — Progress Notes (Signed)
UR COMPLETED  

## 2014-06-28 NOTE — Evaluation (Signed)
Occupational Therapy Evaluation Patient Details Name: Anna Elliott MRN: [id number] DOB: 10-24-1921 Today's Date: 06/28/2014    History of Present Illness Patient is a 81 yo female admitted 06/21/14 with Afib with RVR.  PMH:  HTN, anemia, PAF, CHF, macular degeneration   Clinical Impression   Pt is performing ADL and ADL transfers at a supervision to modified independent level.  She is agreeable to using a RW and realistic about her balance and visual deficits and their impact on her functioning. No further OT needs.      Follow Up Recommendations  No OT follow up    Equipment Recommendations   (RW)    Recommendations for Other Services       Precautions / Restrictions Precautions Precautions: Fall Restrictions Weight Bearing Restrictions: No      Mobility Bed Mobility Overal bed mobility: Modified Independent               Transfers Overall transfer level: Modified independent Equipment used: Rolling walker (2 wheeled)                  Balance Overall balance assessment: Needs assistance   Sitting balance-Leahy Scale: Good       Standing balance-Leahy Scale: Fair                              ADL Overall ADL's : At baseline                                             Vision     Perception     Praxis      Pertinent Vitals/Pain Pain Assessment: No/denies pain     Hand Dominance Right   Extremity/Trunk Assessment Upper Extremity Assessment Upper Extremity Assessment: Overall WFL for tasks assessed   Lower Extremity Assessment Lower Extremity Assessment: Defer to PT evaluation       Communication Communication Communication: No difficulties   Cognition Arousal/Alertness: Awake/alert Behavior During Therapy: WFL for tasks assessed/performed Overall Cognitive Status: Within Functional Limits for tasks assessed                     General Comments       Exercises       Shoulder  Instructions      Home Living Family/patient expects to be discharged to:: Private residence Living Arrangements: Children (daughter) Available Help at Discharge: Family;Available 24 hours/day Type of Home: House Home Access: Stairs to enter [employer] of Steps: 1 Entrance Stairs-Rails: None Home Layout: One level     Bathroom Shower/Tub: Occupational psychologist: Standard     Home Equipment: Cane - single point;Shower seat;Grab bars - tub/shower          Prior Functioning/Environment Level of Independence: Independent;Needs assistance  Gait / Transfers Assistance Needed: Independent with gait. ADL's / Homemaking Assistance Needed: Independent with bathing/dressing.  Daughter assists with meals and provides housekeeping   Comments: pt is agreeable to use of RW at home, unsteady with low vision putting her at risk of fall.    OT Diagnosis:     OT Problem List:     OT Treatment/Interventions:      OT Goals(Current goals can be found in the care plan section)    OT Frequency:     Barriers to D/C:  Co-evaluation              End of Session    Activity Tolerance: Patient tolerated treatment well Patient left: in bed;with call bell/phone within reach   Time: 1521-1538 OT Time Calculation (min): 17 min Charges:  OT General Charges $OT Visit: 1 Procedure OT Evaluation $Initial OT Evaluation Tier I: 1 Procedure G-Codes:    Malka So 06/28/2014, 3:59 PM [phone]

## 2014-06-28 NOTE — Evaluation (Signed)
Physical Therapy Evaluation/ Discharge Patient Details Name: Anna Elliott MRN: [id number] DOB: [birth year] Today's Date: 06/28/2014   History of Present Illness  Patient is a 81 yo female admitted 06/21/14 with Afib with RVR.  PMH:  HTN, anemia, PAF, CHF, macular degeneration  Clinical Impression  Requested to evaluate pt again due to dgtr concern and RN report of unsteady gait. Pt with limited mobility acutely and performed well with RW throughout gait. Pt educated for walking program and bil LE HEP including ankle pumps, long arc quads and marching with dgtr present and both stating understanding. Pt without LOB, pain and HR 76-84 throughout. Pt at baseline level and moving well with use of RW with both pt and dgtr in agreement for RW but no further therapy needs at this time. Will sign off with pt aware and agreeable.     Follow Up Recommendations No PT follow up;Supervision for mobility/OOB    Equipment Recommendations  Rolling walker with 5" wheels    Recommendations for Other Services       Precautions / Restrictions Precautions Precautions: Fall      Mobility  Bed Mobility               General bed mobility comments: in recliner before and after session  Transfers Overall transfer level: Modified independent                  Ambulation/Gait Ambulation/Gait assistance: Supervision Ambulation Distance (Feet): 400 Feet Assistive device: Rolling walker (2 wheeled) Gait Pattern/deviations: Step-through pattern;WFL(Within Functional Limits)   Gait velocity interpretation: at or above normal speed for age/gender General Gait Details: cues for position in RW x 3, steady gait with head turns and good endurance. HR remained 76-84  Stairs            Wheelchair Mobility    Modified Rankin (Stroke Patients Only)       Balance Overall balance assessment: Needs assistance   Sitting balance-Leahy Scale: Good       Standing balance-Leahy Scale: Fair                                Pertinent Vitals/Pain Pain Assessment: No/denies pain    Home Living Family/patient expects to be discharged to:: Private residence Living Arrangements: Children Available Help at Discharge: Family;Available 24 hours/day Type of Home: House Home Access: Stairs to enter Entrance Stairs-Rails: None Entrance Stairs-Number of Steps: 1 Home Layout: One level Home Equipment: Cane - single point;Shower seat      Prior Function Level of Independence: Independent;Needs assistance   Gait / Transfers Assistance Needed: Independent with gait.  ADL's / Homemaking Assistance Needed: Independent with bathing/dressing.  Daughter assists with meals and provides housekeeping  Comments: pt reports increased unsteadiness with gait and desire to start using RW     Hand Dominance        Extremity/Trunk Assessment                         Communication   Communication: No difficulties  Cognition Arousal/Alertness: Awake/alert Behavior During Therapy: WFL for tasks assessed/performed Overall Cognitive Status: Within Functional Limits for tasks assessed                      General Comments      Exercises General Exercises - Lower Extremity Long Arc Quad: AROM;Seated;Both;5 reps      Assessment/Plan  PT Assessment Patent does not need any further PT services  PT Diagnosis Generalized weakness   PT Problem List    PT Treatment Interventions     PT Goals (Current goals can be found in the Care Plan section) Acute Rehab PT Goals PT Goal Formulation: All assessment and education complete, DC therapy    Frequency     Barriers to discharge        Co-evaluation               End of Session   Activity Tolerance: Patient tolerated treatment well Patient left: in chair;with call bell/phone within reach;with family/visitor present Nurse Communication: Mobility status         Time: 3953-2023 PT Time  Calculation (min) (ACUTE ONLY): 17 min   Charges:   PT Evaluation $Initial PT Evaluation Tier I: 1 Procedure     PT G CodesMelford Aase 06/28/2014, 2:21 PM  Elwyn Reach, [place]

## 2014-06-28 NOTE — Progress Notes (Addendum)
Patient Name: Anna Elliott Date of Encounter: 06/28/2014  Principal Problem:   Atrial fibrillation Active Problems:   Essential hypertension   Moderate aortic stenosis   Anemia   Chronic diastolic CHF (congestive heart failure)   CKD (chronic kidney disease), stage III  SUBJECTIVE  Denies chest pain, SOB or palpitation. Feels better. Patient and family upset about "night shift nurse not taking good care and did not bring water for about 2-3 hours last night".  CURRENT MEDS  . cyanocobalamin  500 mcg Oral Daily  . digoxin  0.125 mg Oral Daily  . diltiazem  360 mg Oral QAC breakfast  . diltiazem  120 mg Oral Once  . diphenhydrAMINE  25 mg Intravenous Once  . furosemide  10 mg Oral Daily  . lansoprazole  30 mg Oral Q1200  . potassium chloride  10 mEq Oral Daily  . zolpidem  5 mg Oral QHS   OBJECTIVE  Filed Vitals:   06/27/14 0526 06/27/14 1400 06/27/14 2058 06/28/14 0443  BP: 138/66 129/75 141/64 134/61  Pulse: 97 92 68 74  Temp:  98 F (36.7 C) 98 F (36.7 C) 98.1 F (36.7 C)  TempSrc:  Oral Oral Oral  Resp: 17 20 18 17   Height:      Weight: 123 lb 4.8 oz (55.929 kg)   122 lb 9.6 oz (55.611 kg)  SpO2: 97% 97% 98% 96%    Intake/Output Summary (Last 24 hours) at 06/28/14 0917 Last data filed at 06/28/14 0833  Gross per 24 hour  Intake   1020 ml  Output   1025 ml  Net     -5 ml   Filed Weights   06/26/14 0621 06/27/14 0526 06/28/14 0443  Weight: 123 lb 1.6 oz (55.838 kg) 123 lb 4.8 oz (55.929 kg) 122 lb 9.6 oz (55.611 kg)    PHYSICAL EXAM  General: Pleasant, NAD. Neuro: Alert and oriented X 3. Moves all extremities spontaneously. Psych: Normal affect. HEENT:  Normal  Neck: Supple without bruits or JVD. Lungs:  Resp regular and unlabored, CTA. Heart: Irregular with high rate. Systolic 2/6 murmurs. Abdomen: Soft, non-tender, non-distended, BS + x 4.  Extremities: No clubbing, cyanosis or edema. DP/PT/Radials 2+ and equal bilaterally.  Accessory  Clinical Findings  CBC  Recent Labs  06/26/14 0455  WBC 4.8  HGB 9.8*  HCT 30.5*  MCV 90.8  PLT 740   Basic Metabolic Panel  Recent Labs  06/26/14 0455  NA 139  K 3.8  CL 102  CO2 29  GLUCOSE 105*  BUN 21*  CREATININE 0.97  CALCIUM 8.6*   TELE  Afib at rate of 60-70s. Transient bradycardia to 30-40s.   Radiology/Studies  Dg Chest Portable 1 View  06/21/2014   CLINICAL DATA:  Tachycardia on off for 1 week. Aching across back of neck. Hypertension.  EXAM: PORTABLE CHEST - 1 VIEW  COMPARISON:  06/19/2013  FINDINGS: Mild hyperinflation. Heart is mildly enlarged. No confluent airspace opacities or effusions. No acute bony abnormality.  IMPRESSION: Mild hyperinflation and cardiomegaly.  No active disease.   Electronically Signed   By: Rolm Baptise M.D.   On: 06/21/2014 16:10    ASSESSMENT AND PLAN   1. Persistent Afib/Fltter with intermittent RVR -Now on diltiazem CD 360mg  BID and digoxin 0.125mg  daily. Discontinued 5/31 evening dose of Cardizem 180mg  yesterday due to transient bradycardia and mild dizziness.  Received diltiazem CD 240mg  in AM of 6/1, however patient remains tachycardic especially with minimal activity so plan was to  give Diltiazem CD120mg  @ evening. The evening dose did not administered due to low BP. Given Cardizem CD 360mg  this morning.  Since yesterday afternoon she had transient bradycardia at rate of 30-40s with rate relatively stable at 60-70s. Consider EP evaluation.  - No systemic anticoagulation since 2015 due to iron deficiency anemia. CHA2DS2-Vasc score 5 (age, female, HTN, HF). No ASA due to allergy.  2. Chronic diastolic CHF  - Echo 0/93/23 LV EF of 60%, mild LVH, no WM abnormality.Appears euvolemic. - Continue lasix 10mg  daily.   3. Essential HTN - Stable and well controlled on current regimen.   4. CKD stage III - Stable.  5. Moderate AS  - Stable by echo this admission  6. Hypokalemia - Resolved. Continue daily K-Dur 71mEq.  Monitor with BMET.    Despo: I have personally talk with morning shift nurse regarding last night incident.  Nurse reported that patient had a unsteady gait yesterday during walking. Patient was seen by PT 5/27. Pt recommendations supervision/Assistance 24 hours and signed off. Will get PT/OT eval. Patient requested home health nurse, Case manager on board.   Signed, Leanor Kail PA-C Pager [phone]  The patient was seen, examined and discussed with Bhagat,Bhavinkumar PA-C and I agree with the above.   The patient's HR is now controlled, but gets down to 50', i will decrease cardizem CD to 300 mg po daily. She feels very insecure to walk , we will re-consult PT for another evaluation and arrange for home PT if necessary. She will need to be followed in our clinic within the next two weeks.  She appears euvolemic.   Dorothy Spark, MD, [facility] 06/28/2014

## 2014-06-29 ENCOUNTER — Telehealth: Payer: Self-pay | Admitting: Cardiology

## 2014-06-29 MED ORDER — DILTIAZEM HCL ER COATED BEADS 300 MG PO CP24: 300.0000 mg | ORAL_CAPSULE | Freq: Every day | ORAL | Status: DC

## 2014-06-29 MED ORDER — FUROSEMIDE 20 MG PO TABS: 10.0000 mg | ORAL_TABLET | Freq: Every day | ORAL | Status: DC

## 2014-06-29 MED ORDER — DIGOXIN 125 MCG PO TABS: 0.1250 mg | ORAL_TABLET | Freq: Every day | ORAL | Status: DC

## 2014-06-29 NOTE — Plan of Care (Signed)
Problem: Phase I Progression Outcomes Goal: Ventricular heart rate < 120/min Outcome: Completed/Met Date Met:  06/29/14 Currently heart rate is at 84 and has sustained throughout the night  Problem: Phase II Progression Outcomes Goal: Anticoagulation Therapy per MD order Outcome: Completed/Met Date Met:  06/29/14 None ordered or given due to patient having extreme history of Anemia

## 2014-06-29 NOTE — Progress Notes (Signed)
Patient Name: Anna Elliott Date of Encounter: 06/29/2014  Principal Problem:   Atrial fibrillation Active Problems:   Essential hypertension   Moderate aortic stenosis   Anemia   Chronic diastolic CHF (congestive heart failure)   CKD (chronic kidney disease), stage III  SUBJECTIVE  Denies chest pain, SOB or palpitation. She hasn't walked yet today.  CURRENT MEDS  . cyanocobalamin  500 mcg Oral Daily  . digoxin  0.125 mg Oral Daily  . diltiazem  300 mg Oral QAC breakfast  . diphenhydrAMINE  25 mg Intravenous Once  . furosemide  10 mg Oral Daily  . lansoprazole  30 mg Oral Q1200  . potassium chloride  10 mEq Oral Daily  . zolpidem  5 mg Oral QHS   OBJECTIVE  Filed Vitals:   06/28/14 1418 06/28/14 1550 06/28/14 2030 06/29/14 0531  BP:  120/59 142/63 130/62  Pulse: 84 71 66 73  Temp:  97.8 F (36.6 C) 97.9 F (36.6 C) 97.7 F (36.5 C)  TempSrc:  Oral Oral Oral  Resp:  18 16 18   Height:      Weight:    122 lb 14.4 oz (55.747 kg)  SpO2:  99% 98% 96%    Intake/Output Summary (Last 24 hours) at 06/29/14 1028 Last data filed at 06/29/14 0934  Gross per 24 hour  Intake   1200 ml  Output   1100 ml  Net    100 ml   Filed Weights   06/27/14 0526 06/28/14 0443 06/29/14 0531  Weight: 123 lb 4.8 oz (55.929 kg) 122 lb 9.6 oz (55.611 kg) 122 lb 14.4 oz (55.747 kg)    PHYSICAL EXAM  General: Pleasant, NAD. Neuro: Alert and oriented X 3. Moves all extremities spontaneously. Psych: Normal affect. HEENT:  Normal  Neck: Supple without bruits or JVD. Lungs:  Resp regular and unlabored, CTA. Heart: Irregular with high rate. Systolic 2/6 murmurs. Abdomen: Soft, non-tender, non-distended, BS + x 4.  Extremities: No clubbing, cyanosis or edema. DP/PT/Radials 2+ and equal bilaterally.  Accessory Clinical Findings  CBC No results for input(s): WBC, NEUTROABS, HGB, HCT, MCV, PLT in the last 72 hours. Basic Metabolic Panel  Recent Labs  06/28/14 1123  NA 139  K 4.4    CL 103  CO2 26  GLUCOSE 106*  BUN 18  CREATININE 0.97  CALCIUM 9.1   TELE  Afib at rate of 60-70s. Transient bradycardia to 30-40s.   Radiology/Studies  Dg Chest Portable 1 View  06/21/2014   CLINICAL DATA:  Tachycardia on off for 1 week. Aching across back of neck. Hypertension.  EXAM: PORTABLE CHEST - 1 VIEW  COMPARISON:  06/19/2013  FINDINGS: Mild hyperinflation. Heart is mildly enlarged. No confluent airspace opacities or effusions. No acute bony abnormality.  IMPRESSION: Mild hyperinflation and cardiomegaly.  No active disease.   Electronically Signed   By: Rolm Baptise M.D.   On: 06/21/2014 16:10    ASSESSMENT AND PLAN   1. Persistent Afib/Fltter with intermittent RVR -Now on diltiazem CD 360mg  BID and digoxin 0.125mg  daily. Discontinued 5/31 evening dose of Cardizem 180mg  yesterday due to transient bradycardia and mild dizziness.  Received diltiazem CD 240mg  in AM of 6/1, however patient remains tachycardic especially with minimal activity so plan was to give Diltiazem CD120mg  @ evening. The evening dose did not administered due to low BP. Given Cardizem CD 360mg  this morning.  Since yesterday afternoon she had transient bradycardia at rate of 30-40s with rate relatively stable at 60-70s. Consider EP  evaluation.  - No systemic anticoagulation since 2015 due to iron deficiency anemia. CHA2DS2-Vasc score 5 (age, female, HTN, HF). No ASA due to allergy.  2. Chronic diastolic CHF  - Echo 08/27/63 LV EF of 60%, mild LVH, no WM abnormality.Appears euvolemic. - Continue lasix 10mg  daily.   3. Essential HTN - Stable and well controlled on current regimen.   4. CKD stage III - Stable.  5. Moderate AS  - Stable by echo this admission  6. Hypokalemia - Resolved. Continue daily K-Dur 48mEq. Monitor with BMET.   The patient's HR is now controlled in 60-70', increases to 44' with walking, the patient is asymptomatic, she will be discharged home and follow up appointment will  be arranged in our clinic within 2 weeks.  She appears euvolemic.  Dorothy Spark, MD, [facility] 06/29/2014

## 2014-06-29 NOTE — Progress Notes (Signed)
All d/c instructions explained and given to pt and her daughter.  Verbalized understanding.  D/c off floor at 1212pm via w/c to awaiting transport.  Reta Norgren, Therapist, sports.

## 2014-06-29 NOTE — Discharge Summary (Signed)
Discharge Summary   Patient ID: Anna Elliott,  MRN: [id number], DOB/AGE: 79-Nov-1923 79 y.o.  Admit date: 06/21/2014 Discharge date: 06/29/2014  Primary Care Provider: Sherrie Mustache Primary Cardiologist: Dr. Mare Ferrari  Discharge Diagnoses Principal Problem:   Atrial fibrillation Active Problems:   Essential hypertension   Moderate aortic stenosis   Anemia   Chronic diastolic CHF (congestive heart failure)   CKD (chronic kidney disease), stage III   Allergies Allergies  Allergen Reactions  . Codeine Other (See Comments) and Nausea And Vomiting    Makes her sick  . Potassium Chloride     Loss of appetite  . Amiodarone Rash    Pulmonary distress Possible pulmonary reaction  . Asa [Aspirin] Other (See Comments) and Rash    Acid reflux  . Ramipril Rash  . Sulfa Antibiotics Rash  . Sulfonamide Derivatives Itching and Rash    Procedures  Echocardiogram 06/22/2014 LV EF: 60%  ------------------------------------------------------------------- Indications:   Atrial fibrillation - 427.31.  ------------------------------------------------------------------- History:  PMH:  Congestive heart failure. Risk factors: Hypertension.  ------------------------------------------------------------------- Study Conclusions  - Left ventricle: There is upper septal thickening, but no LVOT obstruction. The cavity size was normal. Wall thickness was increased in a pattern of mild LVH. The estimated ejection fraction was 60%. Wall motion was normal; there were no regional wall motion abnormalities. - Aortic valve: Significant thickening and calcification of the leaflets. However, AS is only moderate. There was trivial regurgitation. - Mitral valve: Moderately calcified, moderately thickened annulus. There was moderate regurgitation. - Left atrium: The atrium was severely dilated. - Right ventricle: Systolic function was mildly to moderately reduced.     HPI  The patient is a 81 year old with PMH of HTN, chronic anemia, moderate AS, GERD/hiatal hernia, history of rheumatic fever, history of PAF with multiple recurrences, and chronic diastolic heart failure. Patient has been followed by Dr. Mare Ferrari. She was initially diagnosed with atrial fibrillation back in 2014 and underwent TEE cardioversion and placed on eliquis. She had recurrence of atrial fibrillation in May 2015. Unfortunately she also had drop in hemoglobin concerning for iron deficiency anemia. Her eliquis was discontinued. She is allergic to aspirin. She has not been on any anticoagulation therapy understanding higher chance of stroke. She was at one point on amiodarone, due to the daughter's concern of pulmonary toxicity associated with amiodarone, this was discontinued. Her last echocardiogram obtained on 06/14/2013, which showed EF 65-70%, no regional wall motion abnormality, moderate AS, moderately to severely calcified mitral valve with mild MR, moderate to severely dilated left atrium, moderate TR, peak PA pressure 33mmHg. Unfortunately, with lack of an arrhythmic therapy in this elderly patient, she is prone to have recurrence of atrial fibrillation. Her last recurrence of atrial fibrillation was in February 2016. She spontaneously converted back into sinus rhythm after a few weeks.   Hospital Course  She presented to [facility] on 06/21/2014 after starting to have tachycardia and palpitation for the past 10 days. Since then, she has been going in and out of atrial fibrillation. She also complained of substernal chest discomfort. She decided to seek medical attention on 5/26, EMS noted diffuse ST depression on EKG and activated code STEMI. On arrival to [facility] ED, she was tachycardic with heart rate in the 140s. Code STEMI was canceled after EKG was reviewed by cardiologist and felt to be more likely atrial fibrillation with RVR. Repeat EKG showed improvement in ST  depression. Serial trop overnight were negative x4. She was initially started on IV diltiazem in  the ED, however ED nursing staff noted some redness in the arm after starting IV diltiazem, therefore the medication was canceled. She was placed on 90 mg every 8 hours of short-acting PO diltiazem for rate control with good response.   She was seen on the following day on 5/27, at which time her heart rate is better controlled. No further ischemic evaluation was planned. Echocardiogram obtained on the same day showed EF 60%, rate no regional wall motion abnormality, moderate AS, moderate MR, mildly to moderately reduced RVEF, severely dilated left atrium. She was initially expected to be discharged on 5/27, however patient was concerned about her heart rate and symptom. Discharge was deferred. Her Cardizem was consolidated to Cardizem CD 180 mg in the morning and 120 mg in the evening. Digoxin was added to assist with rate control. During the course of admission, patient has both tachycardia and bradycardia. Her Cardizem CD dose has been adjusted multiple times between 240 mg and 360 mg. Eventually, Cardizem dose was set at 300 mg daily. Physical therapy initially saw the patient on 5/27 and recommended no PT follow-up. However prior to discharge on 06/28/2014, family insisted on obtaining home health PT and nursing, we consulted physical therapy again. Patient was seen by both PT and OT on the same day on 6/2, again no outpatient PT was recommended at this time other than supervision for mobility/OOB.   She was seen in the morning of 06/29/2014 at which time her heart rate was in the 60 to 70s with occasional rise into the 80s with ambulation. She is deemed stable for discharge from cardiology perspective. I have arranged very close followup for her with Dr. Mare Ferrari office. Unfortunately, given her comorbidities, she is very unlikely to be completely symptom free. Her inability to take ASA or coumadin narrow  potential treatment options to rate control only.    Discharge Vitals Blood pressure 120/56, pulse 82, temperature 97.4 F (36.3 C), temperature source Oral, resp. rate 18, height 5\' 5"  (1.651 m), weight 122 lb 14.4 oz (55.747 kg), SpO2 99 %.  Filed Weights   06/27/14 0526 06/28/14 0443 06/29/14 0531  Weight: 123 lb 4.8 oz (55.929 kg) 122 lb 9.6 oz (55.611 kg) 122 lb 14.4 oz (55.747 kg)    Labs  Basic Metabolic Panel  Recent Labs  06/28/14 1123  NA 139  K 4.4  CL 103  CO2 26  GLUCOSE 106*  BUN 18  CREATININE 0.97  CALCIUM 9.1    Disposition  Pt is being discharged home today in good condition.  Follow-up Plans & Appointments      Follow-up Information    Follow up with Sherrie Mustache, MD.   Specialty:  Family Medicine   Why:  Appointment on June 7th @ 8;30am   Contact information:   [address] [place] [address] [phone]       Follow up with Truitt Merle, NP On 07/09/2014.   Specialties:  Nurse Practitioner, Interventional Cardiology, Cardiology, Radiology   Why:  10:00am   Contact information:   [address] [place] [address] [phone]       Discharge Medications    Medication List    STOP taking these medications        diltiazem 60 MG tablet  Commonly known as:  CARDIZEM      TAKE these medications        cyanocobalamin 500 MCG tablet  Take 500 mcg by mouth daily.     digoxin  0.125 MG tablet  Commonly known as:  LANOXIN  Take 1 tablet (0.125 mg total) by mouth daily.     diltiazem 300 MG 24 hr capsule  Commonly known as:  CARDIZEM CD  Take 1 capsule (300 mg total) by mouth daily before breakfast.     escitalopram 20 MG tablet  Commonly known as:  LEXAPRO  Take 10 mg by mouth daily.     EYE VITAMINS PO  Take 1 tablet by mouth daily.     furosemide 20 MG tablet  Commonly known as:  LASIX  Take 0.5 tablets (10 mg total) by mouth daily.     lansoprazole 30 MG disintegrating tablet    Commonly known as:  PREVACID SOLUTAB  Take 30 mg by mouth daily.     zolpidem 10 MG tablet  Commonly known as:  AMBIEN  Take 5 mg by mouth at bedtime.        Duration of Discharge Encounter   Greater than 30 minutes including physician time.  Hilbert Corrigan PA-C Pager: [phone] 06/29/2014, 11:06 AM

## 2014-06-29 NOTE — Telephone Encounter (Signed)
New message     TOC appt on  6.13.2016 @ 10 am with Tera Helper

## 2014-06-29 NOTE — Discharge Instructions (Signed)

## 2014-07-02 NOTE — Telephone Encounter (Signed)
Patient contacted regarding discharge from [facility] on 06/29/14.  Patient understands to follow up with provider Kathrene Alu on 07/09/14 at 1:30 pm at [facility]. Patient understands discharge instructions? yes Patient understands medications and regiment? yes Patient understands to bring all medications to this visit? yes  Spoke w/pt's daughter, Anna Elliott.  She states the appointment had been made for 10 AM on 6/13 but she can not come that early so was changed to July. Rescheduled her appointment for 6/13 at 1:30- arrive at 1:15. Daughter states she has been stable since discharge.  Reviewed all of her medications and she verbalizes understanding of meds. States BP has ranged from 156/92 to 137/67 today. HR has been under 90 ranging from 73-86.

## 2014-07-06 NOTE — Progress Notes (Signed)
Received message from daughter [name] that the patient did not receive her rolling walker. [facility] called and they will contact the daughter about the delivery of the rolling walker; B Pennie Rushing [phone]

## 2014-07-09 ENCOUNTER — Encounter: Payer: Self-pay | Admitting: Nurse Practitioner

## 2014-07-09 ENCOUNTER — Encounter: Payer: Medicare Other | Admitting: Nurse Practitioner

## 2014-07-09 ENCOUNTER — Ambulatory Visit (INDEPENDENT_AMBULATORY_CARE_PROVIDER_SITE_OTHER): Payer: Medicare Other | Admitting: Nurse Practitioner

## 2014-07-09 VITALS — BP 170/70 | HR 73 | Ht 63.0 in | Wt 123.6 lb

## 2014-07-09 DIAGNOSIS — I482 Chronic atrial fibrillation, unspecified: Secondary | ICD-10-CM

## 2014-07-09 DIAGNOSIS — I5032 Chronic diastolic (congestive) heart failure: Secondary | ICD-10-CM | POA: Diagnosis not present

## 2014-07-09 NOTE — Progress Notes (Signed)
CARDIOLOGY OFFICE NOTE  Date:  07/09/2014    Anna Elliott Date of Birth: [birth year] Medical Record #[id number]  PCP:  Sherrie Mustache, MD  Cardiologist:  Mare Ferrari    Chief Complaint  Patient presents with  . Atrial Fibrillation    Post hospital visit/TOC - seen for Dr. Mare Ferrari.     History of Present Illness: Anna Elliott is a 81 y.o. female who presents today for a post hospital/TOC visit. Seen for Dr. Mare Ferrari. She has a PMH of HTN, chronic anemia, moderate AS, GERD/hiatal hernia, history of rheumatic fever, history of PAF with multiple recurrences, and chronic diastolic heart failure.   She was initially diagnosed with atrial fibrillation back in 2014 and underwent TEE cardioversion and placed on eliquis. She had recurrence of atrial fibrillation in May 2015. Unfortunately she also had drop in hemoglobin concerning for iron deficiency anemia. Her eliquis was discontinued. She is allergic to aspirin. She has not been on any anticoagulation therapy understanding higher chance of stroke. She was at one point on amiodarone, due to the daughter's concern of pulmonary toxicity associated with amiodarone, this was discontinued. Her last echocardiogram obtained on 06/14/2013, which showed EF 65-70%, no regional wall motion abnormality, moderate AS, moderately to severely calcified mitral valve with mild MR, moderate to severely dilated left atrium, moderate TR, peak PA pressure 36mmHg. Unfortunately, with lack of an arrhythmic therapy in this elderly patient, she is prone to have recurrence of atrial fibrillation. Her last recurrence of atrial fibrillation was in February 2016. She spontaneously converted back into sinus rhythm after a few weeks.   She presented to [facility] on 06/21/2014 after starting to have tachycardia and palpitation for the past 10 days. Since then, she has been going in and out of atrial fibrillation. She also complained of substernal chest  discomfort. She decided to seek medical attention on 5/26, EMS noted diffuse ST depression on EKG and activated code STEMI. On arrival to [facility] ED, she was tachycardic with heart rate in the 140s. Code STEMI was canceled after EKG was reviewed by cardiologist and felt to be more likely atrial fibrillation with RVR. Repeat EKG showed improvement in ST depression. Serial trop overnight were negative x4. She was initially started on IV diltiazem in the ED, however ED nursing staff noted some redness in the arm after starting IV diltiazem, therefore the medication was canceled. She was placed on 90 mg every 8 hours of short-acting PO diltiazem for rate control with good response.   Heart rate was controlled. No further ischemic evaluation was planned. Echocardiogram obtained on the same day showed EF 60%, rate no regional wall motion abnormality, moderate AS, moderate MR, mildly to moderately reduced RVEF, severely dilated left atrium. She was initially expected to be discharged on 5/27, however patient was concerned about her heart rate and symptom. Discharge was deferred. Her Cardizem was consolidated to Cardizem CD 180 mg in the morning and 120 mg in the evening. Digoxin was added to assist with rate control. During the course of admission, patient has both tachycardia and bradycardia. Her Cardizem CD dose has been adjusted multiple times between 240 mg and 360 mg. Eventually, Cardizem dose was set at 300 mg daily. Physical therapy initially saw the patient on 5/27 and recommended no PT follow-up. However prior to discharge on 06/28/2014, family insisted on obtaining home health PT and nursing, we consulted physical therapy again. Patient was seen by both PT and OT on the same day on 6/2,  again no outpatient PT was recommended at this time other than supervision for mobility/OOB.   She was seen in the morning of 06/29/2014 at which time her heart rate was in the 60 to 70s with occasional rise into the 80s with  ambulation. Unfortunately, given her comorbidities, she is very unlikely to be completely symptom free. Her inability to take ASA or coumadin narrow potential treatment options to rate control only.   Comes in today. Here with her daughter Anna Elliott. Notes that she remains dizzy when she gets up - still feels weak. No real palpitations - this seems to have improved. Saw PCP since discharge and seeing again next month. No chest pain. No actual syncope. Tolerating her medicines.   Past Medical History  Diagnosis Date  . Hypertension   . Moderate aortic stenosis     a. Dx 2011. b. Echo 05/2012: EF 65%, mild LVH, mod AS mg 46mmHg, mildly dilated LA.  Marland Kitchen Chronic anemia     a. dating back to 2002 - BM bx: early mild dysplasia and/or sideroblastic changes.  Marland Kitchen Dysphagia   . GERD (gastroesophageal reflux disease)   . Esophagitis   . Hiatal hernia   . H/O: rheumatic fever     a. as a child  . Atrial fibrillation     a. Dx 05/2012 unknown duration, started on Eliqiuis.  . Chronic diastolic heart failure   . Tubular adenoma of colon 11/2008    Past Surgical History  Procedure Laterality Date  . Tonsillectomy    . Cesarean section  1962  . Abdominal hysterectomy  1967  . Cataract extraction w/ intraocular lens  implant, bilateral Bilateral 1990's  . Esophageal dilation      "twice in her lifetime, I think; most recent was early 2000's" (06/22/2012)  . Tee without cardioversion N/A 07/04/2012    Procedure: TRANSESOPHAGEAL ECHOCARDIOGRAM (TEE);  Surgeon: Fay Records, MD;  Location: [place];  Service: Cardiovascular;  Laterality: N/A;  talk to [facility]  . Cardioversion N/A 07/04/2012    Procedure: CARDIOVERSION;  Surgeon: Fay Records, MD;  Location: [facility] ENDOSCOPY;  Service: Cardiovascular;  Laterality: N/A;     Medications: Current Outpatient Prescriptions  Medication Sig Dispense Refill  . cyanocobalamin 500 MCG tablet Take 500 mcg by mouth daily.    . digoxin (LANOXIN) 0.125 MG tablet Take 1  tablet (0.125 mg total) by mouth daily. 30 tablet 5  . diltiazem (CARDIZEM CD) 300 MG 24 hr capsule Take 1 capsule (300 mg total) by mouth daily before breakfast. 30 capsule 6  . furosemide (LASIX) 20 MG tablet Take 0.5 tablets (10 mg total) by mouth daily. 30 tablet 2  . lansoprazole (PREVACID SOLUTAB) 30 MG disintegrating tablet Take 30 mg by mouth daily.    . Multiple Vitamins-Minerals (EYE VITAMINS PO) Take 1 tablet by mouth daily.    Marland Kitchen zolpidem (AMBIEN) 10 MG tablet Take 5 mg by mouth at bedtime.      No current facility-administered medications for this visit.    Allergies: Allergies  Allergen Reactions  . Codeine Other (See Comments) and Nausea And Vomiting    Makes her sick  . Potassium Chloride     Loss of appetite  . Amiodarone Rash    Pulmonary distress Possible pulmonary reaction  . Asa [Aspirin] Other (See Comments) and Rash    Acid reflux  . Ramipril Rash  . Sulfa Antibiotics Rash  . Sulfonamide Derivatives Itching and Rash    Social History: The patient  reports that she has never smoked. She has never used smokeless tobacco. She reports that she does not drink alcohol or use illicit drugs.   Family History: The patient's family history includes Colon cancer in her brother; Diabetes in her father; Heart attack in her father; Heart disease in her brother; Lung cancer in her sister; Lymphoma in her sister; Macular degeneration in her mother.   Review of Systems: Please see the history of present illness.   Otherwise, the review of systems is positive for none.   All other systems are reviewed and negative.   Physical Exam: VS:  BP 170/70 mmHg  Pulse 73  Ht 5\' 3"  (1.6 m)  Wt 123 lb 9.6 oz (56.065 kg)  BMI 21.90 kg/m2  SpO2 99% .  BMI Body mass index is 21.9 kg/(m^2).  Wt Readings from Last 3 Encounters:  07/09/14 123 lb 9.6 oz (56.065 kg)  06/29/14 122 lb 14.4 oz (55.747 kg)  06/08/14 125 lb 6.4 oz (56.881 kg)    General: Pleasant. Elderly female who is  in no acute distress.  HEENT: Normal. Neck: Supple, no JVD, carotid bruits, or masses noted.  Cardiac: Irregular irregular rhythm but her rate is fine. Harsh outflow murmur noted.  No edema.  Respiratory:  Lungs are clear to auscultation bilaterally with normal work of breathing.  GI: Soft and nontender.  MS: No deformity or atrophy. Gait and ROM intact. Skin: Warm and dry. Color is normal.  Neuro:  Strength and sensation are intact and no gross focal deficits noted.  Psych: Alert, appropriate and with normal affect.   LABORATORY DATA:  EKG:  EKG is ordered today. This demonstrates AF with a controlled VR of 70.   Lab Results  Component Value Date   WBC 4.8 06/26/2014   HGB 9.8* 06/26/2014   HCT 30.5* 06/26/2014   PLT 177 06/26/2014   GLUCOSE 106* 06/28/2014   CHOL 134 06/22/2014   TRIG 66 06/22/2014   HDL 33* 06/22/2014   LDLCALC 88 06/22/2014   ALT 6 04/13/2014   AST 10 04/13/2014   NA 139 06/28/2014   K 4.4 06/28/2014   CL 103 06/28/2014   CREATININE 0.97 06/28/2014   BUN 18 06/28/2014   CO2 26 06/28/2014   TSH 0.957 06/21/2014   INR 1.21 06/30/2012   HGBA1C 5.4 06/21/2014    BNP (last 3 results)  Recent Labs  06/21/14 1506  BNP 386.9*    ProBNP (last 3 results) No results for input(s): PROBNP in the last 8760 hours.   Other Studies Reviewed Today: Echocardiogram 06/22/2014 LV EF: 60%  ------------------------------------------------------------------- Indications:   Atrial fibrillation - 427.31.  ------------------------------------------------------------------- History:  PMH:  Congestive heart failure. Risk factors: Hypertension.  ------------------------------------------------------------------- Study Conclusions  - Left ventricle: There is upper septal thickening, but no LVOT obstruction. The cavity size was normal. Wall thickness was increased in a pattern of mild LVH. The estimated ejection fraction was 60%. Wall motion was  normal; there were no regional wall motion abnormalities. - Aortic valve: Significant thickening and calcification of the leaflets. However, AS is only moderate. There was trivial regurgitation. - Mitral valve: Moderately calcified, moderately thickened annulus. There was moderate regurgitation. - Left atrium: The atrium was severely dilated. - Right ventricle: Systolic function was mildly to moderately reduced.           Assessment/Plan: 1. AF - looks to now be chronic. Rate is ok today. Not able to be on anticoagulation. Options are quite limited. Would keep on  her current regimen for now.  2. HTN - recheck by me is 160/80 - I have left her on her current regimen.   3. Valvular heart disease - would manage conservatively.  4. Advanced age.   5. Anemia - seeing PCP next month with labs.   Current medicines are reviewed with the patient today.  The patient does not have concerns regarding medicines other than what has been noted above.  The following changes have been made:  See above.  Labs/ tests ordered today include:    Orders Placed This Encounter  Procedures  . EKG 12-Lead     Disposition:   FU with Dr. Mare Ferrari as planned in September.   Patient is agreeable to this plan and will call if any problems develop in the interim.   Signed: Burtis Junes, RN, ANP-C 07/09/2014 2:00 PM  [facility] [address] [place], [place]  [address] Phone: [phone] Fax: [phone]

## 2014-07-09 NOTE — Patient Instructions (Signed)
We will be checking the following labs today - NONE   Medication Instructions:    Continue with your current medicines.     Testing/Procedures To Be Arranged:  N/A  Follow-Up:   See Dr. Mare Ferrari as planned    Other Special Instructions:   N/A  Call the [facility] [place] office at [phone] if you have any questions, problems or concerns.

## 2014-07-25 ENCOUNTER — Other Ambulatory Visit: Payer: Self-pay | Admitting: Cardiology

## 2014-08-09 ENCOUNTER — Encounter: Payer: Medicare Other | Admitting: Cardiology

## 2014-08-09 ENCOUNTER — Encounter: Payer: Self-pay | Admitting: Gastroenterology

## 2014-08-23 ENCOUNTER — Other Ambulatory Visit: Payer: Self-pay | Admitting: Cardiology

## 2014-10-05 ENCOUNTER — Encounter: Payer: Self-pay | Admitting: Cardiology

## 2014-10-05 ENCOUNTER — Ambulatory Visit (INDEPENDENT_AMBULATORY_CARE_PROVIDER_SITE_OTHER): Payer: Medicare Other | Admitting: Cardiology

## 2014-10-05 VITALS — BP 128/80 | HR 88 | Ht 63.0 in | Wt 126.8 lb

## 2014-10-05 DIAGNOSIS — D509 Iron deficiency anemia, unspecified: Secondary | ICD-10-CM | POA: Diagnosis not present

## 2014-10-05 DIAGNOSIS — I480 Paroxysmal atrial fibrillation: Secondary | ICD-10-CM | POA: Diagnosis not present

## 2014-10-05 DIAGNOSIS — I5032 Chronic diastolic (congestive) heart failure: Secondary | ICD-10-CM

## 2014-10-05 DIAGNOSIS — I482 Chronic atrial fibrillation, unspecified: Secondary | ICD-10-CM

## 2014-10-05 NOTE — Patient Instructions (Signed)
Medication Instructions:  Your physician recommends that you continue on your current medications as directed. Please refer to the Current Medication list given to you today.  Labwork: none  Testing/Procedures: none  Follow-Up: Your physician recommends that you schedule a follow-up appointment in: 4 month ov/ekg      

## 2014-10-05 NOTE — Progress Notes (Signed)
Cardiology Office Note   Date:  10/05/2014   ID:  Jadda, Hunsucker 04/25/1921, MRN [id number]  PCP:  Sherrie Mustache, MD  Cardiologist: Darlin Coco MD  No chief complaint on file.     History of Present Illness: Cardiology follow-up Anna Elliott is a 81 y.o. female who presents for   Anna Elliott is a 81 y.o. female who presents for a four-month follow-up office visit This pleasant 81 year old woman is seen for a scheduled followup office visit. She has a past history of paroxysmal atrial fibrillation with rapid ventricular response. She was admitted in June 2014. She was admitted at that time with atrial fibrillation with rapid ventricular response. She was also noted to have known moderate aortic stenosis. She had evidence of acute on chronic diastolic congestive heart failure. She has a history of hypertension. On 07/04/12 she underwent a TEE cardioversion. She converted to normal sinus rhythm with 200 J synchronized biphasic energy. She was discharged the next day maintaining sinus rhythm with first degree AV block. She did well until May 2015 when she went back into atrial fibrillation with rapid ventricular response. During her recent hospitalization her hemoglobin was noted to be dropping and her anticoagulation was discontinued. She is followed at the [facility] for anemia. She is allergic to aspirin and in the past she has refused to take Apixaban. Her most recent echocardiogram on 06/22/14 showed moderate aortic stenosis, trivial aortic insufficiency, moderate mitral regurgitation and her ejection fraction was 60%  in February 2016 she went out of sinus rhythm. Her daughter states that she was out of rhythm for about 3 weeks. She was treated with bedrest and converted on her own back into sinus rhythm. When in atrial fibrillation her heart rate was as high as 139.  The patient was most recently hospitalized 06/21/14 until 06/29/14 for atrial fibrillation  with rapid ventricular response.  She was sent home on diltiazem and digoxin.  The digoxin dose was 0.125 mg daily.  Unfortunately over the course of the summer she developed digitalis toxicity.  She had GI symptoms which were recognized by the home health nurse.  A digoxin level was drawn but it was drawn 6 days after the patient had stopped taking digoxin as of the digoxin level by that time was normal. Past Medical History  Diagnosis Date  . Hypertension   . Moderate aortic stenosis     a. Dx 2011. b. Echo 05/2012: EF 65%, mild LVH, mod AS mg 26mmHg, mildly dilated LA.  Marland Kitchen Chronic anemia     a. dating back to 2002 - BM bx: early mild dysplasia and/or sideroblastic changes.  Marland Kitchen Dysphagia   . GERD (gastroesophageal reflux disease)   . Esophagitis   . Hiatal hernia   . H/O: rheumatic fever     a. as a child  . Atrial fibrillation     a. Dx 05/2012 unknown duration, started on Eliqiuis.  . Chronic diastolic heart failure   . Tubular adenoma of colon 11/2008    Past Surgical History  Procedure Laterality Date  . Tonsillectomy    . Cesarean section  1962  . Abdominal hysterectomy  1967  . Cataract extraction w/ intraocular lens  implant, bilateral Bilateral 1990's  . Esophageal dilation      "twice in her lifetime, I think; most recent was early 2000's" (06/22/2012)  . Tee without cardioversion N/A 07/04/2012    Procedure: TRANSESOPHAGEAL ECHOCARDIOGRAM (TEE);  Surgeon: Fay Records, MD;  Location: [facility]  ENDOSCOPY;  Service: Cardiovascular;  Laterality: N/A;  talk to [place]  . Cardioversion N/A 07/04/2012    Procedure: CARDIOVERSION;  Surgeon: Fay Records, MD;  Location: [facility] ENDOSCOPY;  Service: Cardiovascular;  Laterality: N/A;     Current Outpatient Prescriptions  Medication Sig Dispense Refill  . ALPRAZolam (XANAX) 0.25 MG tablet Take 0.25 mg by mouth 3 (three) times daily as needed (anxiety).     . cyanocobalamin 500 MCG tablet Take 500 mcg by mouth daily.    Marland Kitchen diltiazem (CARDIZEM CD) 300 MG  24 hr capsule Take 1 capsule (300 mg total) by mouth daily before breakfast. 30 capsule 6  . furosemide (LASIX) 20 MG tablet Take 0.5 tablets (10 mg total) by mouth daily. 30 tablet 2  . lansoprazole (PREVACID SOLUTAB) 30 MG disintegrating tablet Take 30 mg by mouth daily.    . Multiple Vitamins-Minerals (EYE VITAMINS PO) Take 1 tablet by mouth daily.    Marland Kitchen zolpidem (AMBIEN) 10 MG tablet Take 5 mg by mouth at bedtime.      No current facility-administered medications for this visit.    Allergies:   Codeine; Digoxin and related; Potassium chloride; Amiodarone; Asa; Ramipril; Sulfa antibiotics; and Sulfonamide derivatives    Social History:  The patient  reports that she has never smoked. She has never used smokeless tobacco. She reports that she does not drink alcohol or use illicit drugs.   Family History:  The patient's family history includes Colon cancer in her brother; Diabetes in her father; Heart attack in her father; Heart disease in her brother; Lung cancer in her sister; Lymphoma in her sister; Macular degeneration in her mother.    ROS:  Please see the history of present illness.   Otherwise, review of systems are positive for none.   All other systems are reviewed and negative.    PHYSICAL EXAM: VS:  BP 128/80 mmHg  Pulse 88  Ht 5\' 3"  (1.6 m)  Wt 126 lb 12.8 oz (57.516 kg)  BMI 22.47 kg/m2 , BMI Body mass index is 22.47 kg/(m^2). GEN: Well nourished, well developed, in no acute distress HEENT: normal Neck: no JVD, carotid bruits, or masses Cardiac: Irregularly irregular.  Grade 2/6 systolic ejection murmur at the base.  No peripheral edema Respiratory:  clear to auscultation bilaterally, normal work of breathing GI: soft, nontender, nondistended, + BS MS: no deformity or atrophy Skin: warm and dry, no rash Neuro:  Strength and sensation are intact Psych: euthymic mood, full affect   EKG:  EKG is ordered today. The ekg ordered today demonstrates atrial fibrillation  with ventricular response of 80 bpm.   Recent Labs: 04/13/2014: ALT 6 06/21/2014: B Natriuretic Peptide 386.9*; TSH 0.957 06/26/2014: Hemoglobin 9.8*; Platelets 177 06/28/2014: BUN 18; Creatinine, Ser 0.97; Potassium 4.4; Sodium 139    Lipid Panel    Component Value Date/Time   CHOL 134 06/22/2014 0451   TRIG 66 06/22/2014 0451   HDL 33* 06/22/2014 0451   CHOLHDL 4.1 06/22/2014 0451   VLDL 13 06/22/2014 0451   LDLCALC 88 06/22/2014 0451      Wt Readings from Last 3 Encounters:  10/05/14 126 lb 12.8 oz (57.516 kg)  07/09/14 123 lb 9.6 oz (56.065 kg)  06/29/14 122 lb 14.4 oz (55.747 kg)        ASSESSMENT AND PLAN:  1.  Chronic atrial fibrillation treated with rate control. 2. intolerance to multiple drugs including amiodarone which she feels gave her an acute pulmonary reaction. 3. Not on anticoagulation because  of her history of iron deficiency anemia and previous severe blood loss. Patient refuses apixaban and she is allergic to aspirin. 4. chronic diastolic heart failure with ejection fraction 65-70%, stable on Lasix 5. essential hypertension 6. Anemia 7. Recent digitalis toxicity. Plan: Continue same medication. Recheck in 4 months for office visit and EKG.   Current medicines are reviewed at length with the patient today.  The patient does not have concerns regarding medicines.  The following changes have been made:  no change  Labs/ tests ordered today include:   Orders Placed This Encounter  Procedures  . EKG 12-Lead       Signed, Darlin Coco MD 10/05/2014 5:35 PM    [facility] [place], [place], [place]  [address] Phone: [phone]; Fax: [phone]

## 2014-10-12 ENCOUNTER — Inpatient Hospital Stay (HOSPITAL_COMMUNITY)
Admission: EM | Admit: 2014-10-12 | Discharge: 2014-10-24 | DRG: 242 | Disposition: A | Discharge status: Home / Self Care | Payer: Medicare Other | Attending: Cardiology | Admitting: Cardiology

## 2014-10-12 ENCOUNTER — Telehealth: Payer: Self-pay | Admitting: Cardiology

## 2014-10-12 ENCOUNTER — Emergency Department (HOSPITAL_COMMUNITY): Payer: Medicare Other

## 2014-10-12 ENCOUNTER — Encounter (HOSPITAL_COMMUNITY): Payer: Self-pay | Admitting: *Deleted

## 2014-10-12 DIAGNOSIS — Z6820 Body mass index (BMI) 20.0-20.9, adult: Secondary | ICD-10-CM

## 2014-10-12 DIAGNOSIS — I4891 Unspecified atrial fibrillation: Secondary | ICD-10-CM | POA: Diagnosis not present

## 2014-10-12 DIAGNOSIS — I5031 Acute diastolic (congestive) heart failure: Secondary | ICD-10-CM | POA: Diagnosis present

## 2014-10-12 DIAGNOSIS — I10 Essential (primary) hypertension: Secondary | ICD-10-CM | POA: Diagnosis not present

## 2014-10-12 DIAGNOSIS — I481 Persistent atrial fibrillation: Secondary | ICD-10-CM | POA: Diagnosis not present

## 2014-10-12 DIAGNOSIS — R079 Chest pain, unspecified: Secondary | ICD-10-CM | POA: Insufficient documentation

## 2014-10-12 DIAGNOSIS — R0600 Dyspnea, unspecified: Secondary | ICD-10-CM | POA: Insufficient documentation

## 2014-10-12 DIAGNOSIS — I5033 Acute on chronic diastolic (congestive) heart failure: Secondary | ICD-10-CM | POA: Diagnosis not present

## 2014-10-12 DIAGNOSIS — E8779 Other fluid overload: Secondary | ICD-10-CM

## 2014-10-12 DIAGNOSIS — R0602 Shortness of breath: Secondary | ICD-10-CM

## 2014-10-12 DIAGNOSIS — I350 Nonrheumatic aortic (valve) stenosis: Secondary | ICD-10-CM

## 2014-10-12 DIAGNOSIS — Z95818 Presence of other cardiac implants and grafts: Secondary | ICD-10-CM

## 2014-10-12 DIAGNOSIS — I959 Hypotension, unspecified: Secondary | ICD-10-CM | POA: Diagnosis not present

## 2014-10-12 DIAGNOSIS — I480 Paroxysmal atrial fibrillation: Secondary | ICD-10-CM | POA: Diagnosis not present

## 2014-10-12 DIAGNOSIS — I272 Other secondary pulmonary hypertension: Secondary | ICD-10-CM | POA: Diagnosis present

## 2014-10-12 DIAGNOSIS — I509 Heart failure, unspecified: Secondary | ICD-10-CM

## 2014-10-12 DIAGNOSIS — F4323 Adjustment disorder with mixed anxiety and depressed mood: Secondary | ICD-10-CM | POA: Insufficient documentation

## 2014-10-12 DIAGNOSIS — Z7901 Long term (current) use of anticoagulants: Secondary | ICD-10-CM

## 2014-10-12 DIAGNOSIS — E1122 Type 2 diabetes mellitus with diabetic chronic kidney disease: Secondary | ICD-10-CM | POA: Diagnosis present

## 2014-10-12 DIAGNOSIS — Z66 Do not resuscitate: Secondary | ICD-10-CM | POA: Diagnosis present

## 2014-10-12 DIAGNOSIS — E43 Unspecified severe protein-calorie malnutrition: Secondary | ICD-10-CM | POA: Diagnosis present

## 2014-10-12 DIAGNOSIS — I129 Hypertensive chronic kidney disease with stage 1 through stage 4 chronic kidney disease, or unspecified chronic kidney disease: Secondary | ICD-10-CM | POA: Diagnosis present

## 2014-10-12 DIAGNOSIS — Z515 Encounter for palliative care: Secondary | ICD-10-CM | POA: Insufficient documentation

## 2014-10-12 DIAGNOSIS — R000 Tachycardia, unspecified: Secondary | ICD-10-CM

## 2014-10-12 DIAGNOSIS — I495 Sick sinus syndrome: Secondary | ICD-10-CM | POA: Diagnosis present

## 2014-10-12 DIAGNOSIS — N183 Chronic kidney disease, stage 3 unspecified: Secondary | ICD-10-CM | POA: Diagnosis present

## 2014-10-12 DIAGNOSIS — D649 Anemia, unspecified: Secondary | ICD-10-CM | POA: Diagnosis present

## 2014-10-12 DIAGNOSIS — Z950 Presence of cardiac pacemaker: Secondary | ICD-10-CM

## 2014-10-12 LAB — PROTIME-INR
INR: 1.23 (ref 0.00–1.49)
Prothrombin Time: 15.6 s — ABNORMAL HIGH (ref 11.6–15.2)

## 2014-10-12 LAB — COMPREHENSIVE METABOLIC PANEL WITH GFR
ALT: 53 U/L (ref 14–54)
AST: 52 U/L — ABNORMAL HIGH (ref 15–41)
Albumin: 3.9 g/dL (ref 3.5–5.0)
Alkaline Phosphatase: 76 U/L (ref 38–126)
Anion gap: 8 (ref 5–15)
BUN: 18 mg/dL (ref 6–20)
CO2: 30 mmol/L (ref 22–32)
Calcium: 8.9 mg/dL (ref 8.9–10.3)
Chloride: 103 mmol/L (ref 101–111)
Creatinine, Ser: 1.03 mg/dL — ABNORMAL HIGH (ref 0.44–1.00)
GFR calc Af Amer: 53 mL/min — ABNORMAL LOW
GFR calc non Af Amer: 46 mL/min — ABNORMAL LOW
Glucose, Bld: 111 mg/dL — ABNORMAL HIGH (ref 65–99)
Potassium: 3.9 mmol/L (ref 3.5–5.1)
Sodium: 141 mmol/L (ref 135–145)
Total Bilirubin: 2 mg/dL — ABNORMAL HIGH (ref 0.3–1.2)
Total Protein: 6.3 g/dL — ABNORMAL LOW (ref 6.5–8.1)

## 2014-10-12 LAB — CBC WITH DIFFERENTIAL/PLATELET
BASOS PCT: 0 %
Basophils Absolute: 0 10*3/uL (ref 0.0–0.1)
Eosinophils Absolute: 0 10*3/uL (ref 0.0–0.7)
Eosinophils Relative: 1 %
HCT: 33.8 % — ABNORMAL LOW (ref 36.0–46.0)
Hemoglobin: 10.5 g/dL — ABNORMAL LOW (ref 12.0–15.0)
Lymphocytes Relative: 13 %
Lymphs Abs: 0.7 10*3/uL (ref 0.7–4.0)
MCH: 27.7 pg (ref 26.0–34.0)
MCHC: 31.1 g/dL (ref 30.0–36.0)
MCV: 89.2 fL (ref 78.0–100.0)
MONO ABS: 0.5 10*3/uL (ref 0.1–1.0)
Monocytes Relative: 9 %
Neutro Abs: 4.6 10*3/uL (ref 1.7–7.7)
Neutrophils Relative %: 77 %
Platelets: 195 10*3/uL (ref 150–400)
RBC: 3.79 MIL/uL — ABNORMAL LOW (ref 3.87–5.11)
RDW: 16.4 % — AB (ref 11.5–15.5)
WBC: 5.9 10*3/uL (ref 4.0–10.5)

## 2014-10-12 LAB — BRAIN NATRIURETIC PEPTIDE: B Natriuretic Peptide: 358.8 pg/mL — ABNORMAL HIGH (ref 0.0–100.0)

## 2014-10-12 LAB — I-STAT TROPONIN, ED: Troponin i, poc: 0.01 ng/mL (ref 0.00–0.08)

## 2014-10-12 LAB — APTT: aPTT: 45 s — ABNORMAL HIGH (ref 24–37)

## 2014-10-12 LAB — MAGNESIUM: MAGNESIUM: 2.1 mg/dL (ref 1.7–2.4)

## 2014-10-12 MED ORDER — HEPARIN BOLUS VIA INFUSION
3000.0000 [IU] | Freq: Once | INTRAVENOUS | Status: DC
Start: 2014-10-12 — End: 2014-10-13
  Filled 2014-10-12: qty 3000

## 2014-10-12 MED ORDER — PANTOPRAZOLE SODIUM 40 MG PO TBEC
40.0000 mg | DELAYED_RELEASE_TABLET | Freq: Every day | ORAL | Status: DC
  Filled 2014-10-12 (×2): qty 1

## 2014-10-12 MED ORDER — VITAMIN B-12 1000 MCG PO TABS
500.0000 ug | ORAL_TABLET | Freq: Every day | ORAL | Status: DC
  Administered 2014-10-13 – 2014-10-24 (×12): 500 ug via ORAL
  Filled 2014-10-12 (×12): qty 1

## 2014-10-12 MED ORDER — LANSOPRAZOLE 30 MG PO TBDP: 30.0000 mg | ORAL_TABLET | Freq: Every day | ORAL | Status: DC

## 2014-10-12 MED ORDER — DILTIAZEM HCL 100 MG IV SOLR
5.0000 mg/h | Freq: Once | INTRAVENOUS | Status: AC
  Administered 2014-10-12: 5 mg/h via INTRAVENOUS
  Filled 2014-10-12: qty 100

## 2014-10-12 MED ORDER — ONDANSETRON HCL 4 MG/2ML IJ SOLN: 4.0000 mg | Freq: Four times a day (QID) | INTRAMUSCULAR | Status: DC | PRN

## 2014-10-12 MED ORDER — ACETAMINOPHEN 325 MG PO TABS
650.0000 mg | ORAL_TABLET | ORAL | Status: DC | PRN
  Administered 2014-10-16: 650 mg via ORAL
  Filled 2014-10-12: qty 2

## 2014-10-12 MED ORDER — DILTIAZEM HCL 90 MG PO TABS
90.0000 mg | ORAL_TABLET | Freq: Three times a day (TID) | ORAL | Status: DC
  Administered 2014-10-12 – 2014-10-13 (×2): 90 mg via ORAL
  Filled 2014-10-12: qty 1
  Filled 2014-10-12: qty 3
  Filled 2014-10-12 (×4): qty 1

## 2014-10-12 MED ORDER — ALPRAZOLAM 0.25 MG PO TABS
0.2500 mg | ORAL_TABLET | Freq: Three times a day (TID) | ORAL | Status: DC | PRN
  Administered 2014-10-23: 0.25 mg via ORAL
  Filled 2014-10-12: qty 1

## 2014-10-12 MED ORDER — DILTIAZEM HCL 25 MG/5ML IV SOLN
10.0000 mg | Freq: Once | INTRAVENOUS | Status: AC
  Administered 2014-10-12: 10 mg via INTRAVENOUS

## 2014-10-12 MED ORDER — FUROSEMIDE 10 MG/ML IJ SOLN
20.0000 mg | Freq: Four times a day (QID) | INTRAMUSCULAR | Status: DC
  Administered 2014-10-12 – 2014-10-13 (×3): 20 mg via INTRAVENOUS
  Filled 2014-10-12 (×3): qty 2

## 2014-10-12 MED ORDER — HEPARIN (PORCINE) IN NACL 100-0.45 UNIT/ML-% IJ SOLN: 800.0000 [IU]/h | INTRAMUSCULAR | Status: DC

## 2014-10-12 MED ORDER — ZOLPIDEM TARTRATE 5 MG PO TABS
5.0000 mg | ORAL_TABLET | Freq: Every day | ORAL | Status: DC
  Administered 2014-10-12 – 2014-10-23 (×12): 5 mg via ORAL
  Filled 2014-10-12 (×12): qty 1

## 2014-10-12 NOTE — ED Notes (Signed)
Attempted report 

## 2014-10-12 NOTE — Progress Notes (Signed)
Paient and patient's daughter Anna Elliott has refuse to have any blood thinner started at this time. Also they are requesting that Mrs Dorrance be a DNR which she has been for some time and per her Advance Directives. Daughter was ask to bring Living will and POA forms for chart. See Orders and Dr Colon Flattery will come see and talk with patient later tonight about Blood thinners. R.N. aware

## 2014-10-12 NOTE — Telephone Encounter (Signed)
New Message       Pt's daughter calling stating that the pt is at the ER and because she can't breathe and that the staff there isn't doing anything for her. Pt would like to talk to [facility] today. Please call back and advise.

## 2014-10-12 NOTE — ED Notes (Signed)
Pt in from home via [facility]. EMS, per report pt c/o SOB onset x 5 days with worsening, pt hx of a fib with HR irregular with HR 135, A&O x4, follows commands, speaks in complete sentences, follows commands, denies CP, n/v/d, pt takes Cardiazem

## 2014-10-12 NOTE — ED Notes (Signed)
Daugter at bedside, pt states, "This happened the last time she got liquid Cardiazem. I want her cardiologist to know she is here." this RN & the MD at bedside is speaking with the pt & the pt & family is aware of plan of care, the daughter is aware that the medication has been stopped & that cardiology will be consulted

## 2014-10-12 NOTE — ED Notes (Signed)
PT placed on bedside commode with minimal assistance. Pt's family in room assisting with bedside commode if needed. Pt and family informed to ask staff for assistance if needed.

## 2014-10-12 NOTE — Telephone Encounter (Signed)
Spoke with daughter and patient has been at the ED for several hours and has not seen a cardiologist Spoke with Wannetta Sender and she is currently being seen by Valetta Fuller T PA

## 2014-10-12 NOTE — Progress Notes (Signed)
ANTICOAGULATION CONSULT NOTE - Initial Consult  Pharmacy Consult for heparin Indication: atrial fibrillation  Allergies  Allergen Reactions  . Amiodarone Shortness Of Breath    Pulmonary distress Possible pulmonary reaction  . Codeine Itching and Nausea And Vomiting    Makes her sick  . Digoxin And Related Other (See Comments)    Causes "insides to quiver" "very weak"  . Potassium Chloride     Loss of appetite  . Asa [Aspirin] Other (See Comments) and Rash    Acid reflux  . Ramipril Rash  . Sulfa Antibiotics Rash  . Sulfonamide Derivatives Itching and Rash    Patient Measurements: Height: 5\' 3"  (160 cm) IBW/kg (Calculated) : 52.4 Weight: 57.5 kg  Vital Signs: Temp: 98 F (36.7 C) (09/16 2019) Temp Source: Oral (09/16 2019) BP: 139/111 mmHg (09/16 2019) Pulse Rate: 94 (09/16 2019)  Labs:  Recent Labs  10/12/14 1505  HGB 10.5*  HCT 33.8*  PLT 195  APTT 45*  LABPROT 15.6*  INR 1.23  CREATININE 1.03*    Estimated Creatinine Clearance: 29.4 mL/min (by C-G formula based on Cr of 1.03).   Medical History: Past Medical History  Diagnosis Date  . Hypertension   . Moderate aortic stenosis     a. Dx 2011. b. Echo 05/2012: EF 65%, mild LVH, mod AS mg 68mmHg, mildly dilated LA.  Marland Kitchen Chronic anemia     a. dating back to 2002 - BM bx: early mild dysplasia and/or sideroblastic changes.  Marland Kitchen Dysphagia   . GERD (gastroesophageal reflux disease)   . Esophagitis   . Hiatal hernia   . H/O: rheumatic fever     a. as a child  . Atrial fibrillation     a. Dx 05/2012 unknown duration, started on Eliqiuis.  . Chronic diastolic heart failure   . Tubular adenoma of colon 11/2008    Medications:  Prescriptions prior to admission  Medication Sig Dispense Refill Last Dose  . ALPRAZolam (XANAX) 0.25 MG tablet Take 0.25 mg by mouth 3 (three) times daily as needed (anxiety).    10/11/2014 at Unknown time  . diltiazem (CARDIZEM CD) 300 MG 24 hr capsule Take 1 capsule (300 mg total)  by mouth daily before breakfast. 30 capsule 6 10/12/2014 at 0600  . furosemide (LASIX) 20 MG tablet Take 0.5 tablets (10 mg total) by mouth daily. 30 tablet 2 10/12/2014 at Unknown time  . lansoprazole (PREVACID SOLUTAB) 30 MG disintegrating tablet Take 30 mg by mouth daily.   10/11/2014 at Unknown time  . Multiple Vitamins-Minerals (EYE VITAMINS PO) Take 1 tablet by mouth daily.   10/10/2014  . zolpidem (AMBIEN) 10 MG tablet Take 5 mg by mouth at bedtime.    10/11/2014 at Unknown time    Assessment: 81 y/o female who presents to ED with worsening SOB found to be in Afib. She had previously been on apixaban for Afib but this was stopped due to drop in Hb concerning for IDA. She has an allergy to aspirin also. No bleeding noted, Hb is low at 10.5, platelets are normal. Pharmacy consulted to begin IV heparin.  Goal of Therapy:  Heparin level 0.3-0.7 units/ml Monitor platelets by anticoagulation protocol: Yes   Plan:  - Heparin 3000 units IV bolus then infuse at 800 units/hr  - 8 hr heparin level - Daily heparin level and CBC - Monitor for s/sx of bleeding  [facility], [place].D., BCPS Clinical Pharmacist Pager: [phone] 10/12/2014 9:38 PM

## 2014-10-12 NOTE — ED Notes (Signed)
Pt placed in gown and in bed. Pt monitored  By pulse ox, bp cuff, and 12-lead.

## 2014-10-12 NOTE — H&P (Signed)
Patient ID: Yurani Fettes MRN: [id number], DOB/AGE: 1921-07-23   Admit date: 10/12/2014   Primary Physician: Sherrie Mustache, MD Primary Cardiologist: Dr. Mare Ferrari   Pt. Profile: The patient is a 81 year old with PMH of HTN, chronic anemia, moderate AS, GERD/hiatal hernia, history of rheumatic fever, history of PAF with multiple recurrences, and chronic diastolic heart failure who presented to [facility] today with recurrent afib with RVR  Patient has been followed by Dr. Mare Ferrari. She was initially diagnosed with atrial fibrillation back in 2014 and underwent TEE cardioversion and placed on eliquis. She had recurrence of atrial fibrillation in May 2015. Unfortunately she also had drop in hemoglobin concerning for iron deficiency anemia. Her eliquis was discontinued. She is allergic to aspirin. She has not been on any anticoagulation therapy understanding higher chance of stroke. She was at one point on amiodarone, due to the daughter's concern of pulmonary toxicity associated with amiodarone, this was discontinued. Unfortunately, with lack of an arrhythmic therapy in this elderly patient, she is prone to have recurrence of atrial fibrillation. She had a recurrence of atrial fibrillation was in February 2016. She spontaneously converted back into sinus rhythm after a few weeks.   The patient was most recently hospitalized 06/21/14 until 06/29/14 for atrial fibrillation with rapid ventricular response. She was initially started on IV diltiazem in the ED, however ED nursing staff noted some redness in the arm after starting IV diltiazem, therefore the medication was canceled. She was placed on 90 mg every 8 hours of short-acting PO diltiazem for rate control with good response. This was eventual/ly consolidated to Cardizem 300 mg daily. 2D ECHO  showed EF 60%, no regional wall motion abnormality, moderate AS, moderate MR, mildly to moderately reduced RVEF, and severely dilated left atrium. She was  sent home on Cardizem 300 mg daily and digoxin 0.125 mg daily. Unfortunately over the course of the summer she developed digitalis toxicity. She had GI symptoms which were recognized by the home health nurse. A digoxin level was drawn but it was drawn 6 days after the patient had stopped taking digoxin as of the digoxin level by that time was normal.  She saw Dr. Mare Ferrari in the office on 10/05/14. She was in afib with CVR HR 80 bpm. She was felt to be doing well from a cardiac standpoint. Over the past couple days she has been feeling more short of breath. She admits to orthopnea, PND and chronic LE edema which is stable. She has been taking her lasix at home. Daughter is present in room and very abrasive. She is convinced that the shortness of breath has caused the afib. Thinks she has PNA. Also she is concerned because she hasn't been urinating despite taking her lasix at home. No CP or palpitations.  Problem List  Past Medical History  Diagnosis Date  . Hypertension   . Moderate aortic stenosis     a. Dx 2011. b. Echo 05/2012: EF 65%, mild LVH, mod AS mg 74mmHg, mildly dilated LA.  Marland Kitchen Chronic anemia     a. dating back to 2002 - BM bx: early mild dysplasia and/or sideroblastic changes.  Marland Kitchen Dysphagia   . GERD (gastroesophageal reflux disease)   . Esophagitis   . Hiatal hernia   . H/O: rheumatic fever     a. as a child  . Atrial fibrillation     a. Dx 05/2012 unknown duration, started on Eliqiuis.  . Chronic diastolic heart failure   . Tubular adenoma of colon 11/2008  Past Surgical History  Procedure Laterality Date  . Tonsillectomy    . Cesarean section  1962  . Abdominal hysterectomy  1967  . Cataract extraction w/ intraocular lens  implant, bilateral Bilateral 1990's  . Esophageal dilation      "twice in her lifetime, I think; most recent was early 2000's" (06/22/2012)  . Tee without cardioversion N/A 07/04/2012    Procedure: TRANSESOPHAGEAL ECHOCARDIOGRAM (TEE);  Surgeon: Fay Records, MD;  Location: [place];  Service: Cardiovascular;  Laterality: N/A;  talk to [facility]  . Cardioversion N/A 07/04/2012    Procedure: CARDIOVERSION;  Surgeon: Fay Records, MD;  Location: [place] [facility];  Service: Cardiovascular;  Laterality: N/A;     Allergies  Allergies  Allergen Reactions  . Codeine Other (See Comments) and Nausea And Vomiting    Makes her sick  . Digoxin And Related Other (See Comments)    Causes "insides to quiver" "very weak"  . Potassium Chloride     Loss of appetite  . Amiodarone Rash    Pulmonary distress Possible pulmonary reaction  . Asa [Aspirin] Other (See Comments) and Rash    Acid reflux  . Ramipril Rash  . Sulfa Antibiotics Rash  . Sulfonamide Derivatives Itching and Rash     Home Medications  Prior to Admission medications   Medication Sig Start Date End Date Taking? Authorizing Provider  ALPRAZolam (XANAX) 0.25 MG tablet Take 0.25 mg by mouth 3 (three) times daily as needed (anxiety).  10/02/14 10/02/15  Historical Provider, MD  cyanocobalamin 500 MCG tablet Take 500 mcg by mouth daily.    Historical Provider, MD  diltiazem (CARDIZEM CD) 300 MG 24 hr capsule Take 1 capsule (300 mg total) by mouth daily before breakfast. 06/29/14   Almyra Deforest, PA  furosemide (LASIX) 20 MG tablet Take 0.5 tablets (10 mg total) by mouth daily. 06/29/14   Almyra Deforest, PA  lansoprazole (PREVACID SOLUTAB) 30 MG disintegrating tablet Take 30 mg by mouth daily.    Historical Provider, MD  Multiple Vitamins-Minerals (EYE VITAMINS PO) Take 1 tablet by mouth daily.    Historical Provider, MD  zolpidem (AMBIEN) 10 MG tablet Take 5 mg by mouth at bedtime.  09/14/11   Historical Provider, MD    Family History  Family History  Problem Relation Age of Onset  . Diabetes Father   . Heart attack Father   . Heart disease Brother   . Colon cancer Brother   . Lung cancer Sister   . Lymphoma Sister   . Macular degeneration Mother    Family Status  Relation Status Death Age  . Father  Deceased   . Brother Deceased   . Sister Deceased   . Sister Deceased   . Mother Deceased     pacemaker  . Sister Deceased   . Maternal Grandmother Deceased   . Maternal Grandfather Deceased   . Paternal Grandmother Deceased   . Paternal Grandfather Deceased      Social History  Social History   Social History  . Marital Status: Widowed    Spouse Name: N/A  . Number of Children: N/A  . Years of Education: N/A   Occupational History  . Retired    Social History Main Topics  . Smoking status: Never Smoker   . Smokeless tobacco: Never Used  . Alcohol Use: No  . Drug Use: No  . Sexual Activity: Not on file   Other Topics Concern  . Not on file   Social History Narrative  Lives in [place] with her dtr and son-in-law.  She is active around the house and occasionally goes for walks at the mall.  No h/o falls though she notes that her macular degeneration limits her vision somewhat and she is prone to losing her balance on inclines.     All other systems reviewed and are otherwise negative except as noted above.  Physical Exam  Blood pressure 146/86, pulse 93, temperature 98.1 F (36.7 C), temperature source Oral, resp. rate 15, SpO2 100 %.  General: Pleasant, NAD Psych: Normal affect. Neuro: Alert and oriented X 3. Moves all extremities spontaneously. HEENT: Normal  Neck: Supple without bruits or JVD. Lungs:  Resp regular and unlabored, CTA. Heart: irreg irreg no s3, s4, +SEM Abdomen: Soft, non-tender, non-distended, BS + x 4.  Extremities: No clubbing, cyanosis or edema. DP/PT/Radials 2+ and equal bilaterally. 1+ LE edema bilaterally  Labs  No results for input(s): CKTOTAL, CKMB, TROPONINI in the last 72 hours. Lab Results  Component Value Date   WBC 5.9 10/12/2014   HGB 10.5* 10/12/2014   HCT 33.8* 10/12/2014   MCV 89.2 10/12/2014   PLT 195 10/12/2014    Recent Labs Lab 10/12/14 1505  NA 141  K 3.9  CL 103  CO2 30  BUN 18  CREATININE 1.03*    CALCIUM 8.9  PROT 6.3*  BILITOT 2.0*  ALKPHOS 76  ALT 53  AST 52*  GLUCOSE 111*   Lab Results  Component Value Date   CHOL 134 06/22/2014   HDL 33* 06/22/2014   LDLCALC 88 06/22/2014   TRIG 66 06/22/2014   No results found for: DDIMER   Radiology/Studies  Dg Chest 2 View  10/12/2014   CLINICAL DATA:  Shortness of breath for several days, atrial fibrillation  EXAM: CHEST  2 VIEW  COMPARISON:  06/21/2014  FINDINGS: Cardiomegaly evident with minor central vascular congestion. Slightly lower lung volumes with small pleural effusions, slightly larger on the left. Minor associated basilar atelectasis. No definite focal pneumonia. Negative for pneumothorax. Aortic atherosclerosis noted. Trachea is midline.  IMPRESSION: cardiomegaly with vascular congestion  Lower lung volumes with basilar atelectasis  Trace pleural effusions, slightly larger on the left   Electronically Signed   By: Jerilynn Mages.  Shick M.D.   On: 10/12/2014 14:25    2D ECHO: 06/22/2014 LV EF: 60% Study Conclusions - Left ventricle: There is upper septal thickening, but no LVOT obstruction. The cavity size was normal. Wall thickness was increased in a pattern of mild LVH. The estimated ejection fraction was 60%. Wall motion was normal; there were no regional wall motion abnormalities. - Aortic valve: Significant thickening and calcification of the leaflets. However, AS is only moderate. There was trivial regurgitation. - Mitral valve: Moderately calcified, moderately thickened annulus. There was moderate regurgitation. - Left atrium: The atrium was severely dilated. - Right ventricle: Systolic function was mildly to moderately reduced.   ECG  HR 146 Atrial fibrillation with RVR ST depression, probably rate related  ASSESSMENT AND PLAN  The patient is a 81 year old with PMH of HTN, chronic anemia, moderate AS, GERD/hiatal hernia, history of rheumatic fever, history of PAF with multiple recurrences, and  chronic diastolic heart failure who presented to [facility] today with recurrent afib with RVR  Atrial fibrillation with RVR- will start  90 mg every 8 hours of short-acting PO diltiazem for rate control (she cannot tolerate IV dilt due to skin reaction and this worked last time she was admitted). Will start IV heparin white inpatient with  no plans to for long term AV at discharge. CHADSVASC score at least 5 (HTN 1, CHF 1, AGE 35, F SEX1).  Not on anticoagulation because of her history of iron deficiency anemia and previous severe blood loss. Patient refuses apixaban and she is allergic to aspirin. She has an intolerance to multiple drugs including amiodarone which she feels gave her an acute pulmonary reaction. Recent digitalis toxicity.  Acute on chronic diastolic heart failure - BNP slightly elevated (358) and vascular congestion of CXR -- 2D ECHO (05/2014) with ejection fraction 60%, she takes 10mg  Lasix po qd  -- WIll start Lasix 20mg  IV q6.  Per daughter, she has not been urinating well lately. If she has difficulty with UOP will plan for foley.  Hypertension- well controlled currently  Anemia- stable.   Aortic stenosis- moderate by most recent ECHO.   Judy Pimple, PA-C 10/12/2014, 4:44 PM  Pager [phone]  The patient was seen, examined and discussed with Jory Sims, NP and I agree with the above.   A 81 year old female well known to our service with PAF and acute on chronic diastolic CHF sec to a-fib with RVR. She reports SOB, PND and LE edema for about two days. We will restart the management that worked at the last admission with Cardizem 90 mg po Q6H (instead of iv Cardizem that caused her superficial thrombophlebitis) and with lasix 20 mg iv q6H. She didn't tolerate digoxin. The ideal management would be amiodarone however daughter opposed to the idea as she is worrying about potential side effects. I think this should be re-discussed to prevent frequent  hospitalizations.  BP is mildly elevated, for now we will give cardizem and lasix.   Dorothy Spark 10/12/2014

## 2014-10-12 NOTE — ED Provider Notes (Signed)
CSN: [id number]     Arrival date & time 10/12/14  1322 History   First MD Initiated Contact with Patient 10/12/14 1323     Chief Complaint  Patient presents with  . Atrial Fibrillation   Patient is a 81 y.o. female presenting with general illness. The history is provided by the patient and a relative. No language interpreter was used.  Illness Location:  Generalized Quality:  SOB Severity:  Moderate Onset quality:  Gradual Timing:  Constant Progression:  Worsening Chronicity:  New Context:  PMHx of HTN, A-fib, CHF, & GERD presenting with SOB. Cough and history of A. fib initially with cardioversion resulting in transient conversion, followed by anticoagulation resulting in large diabetes and anemia requiring transfusion, followed by attempted regiment on digoxin eventually causing failure to thrive, and now just on diltiazem. Patient denies recent change in regimen of diltiazem. Associated symptoms: shortness of breath   Associated symptoms: no abdominal pain, no chest pain, no congestion, no diarrhea, no fever, no headaches, no loss of consciousness, no nausea and no vomiting     Past Medical History  Diagnosis Date  . Hypertension   . Moderate aortic stenosis     a. Dx 2011. b. Echo 05/2012: EF 65%, mild LVH, mod AS mg 71mmHg, mildly dilated LA.  Marland Kitchen Chronic anemia     a. dating back to 2002 - BM bx: early mild dysplasia and/or sideroblastic changes.  Marland Kitchen Dysphagia   . GERD (gastroesophageal reflux disease)   . Esophagitis   . Hiatal hernia   . H/O: rheumatic fever     a. as a child  . Atrial fibrillation     a. Dx 05/2012 unknown duration, started on Eliqiuis.  . Chronic diastolic heart failure   . Tubular adenoma of colon 11/2008   Past Surgical History  Procedure Laterality Date  . Tonsillectomy    . Cesarean section  1962  . Abdominal hysterectomy  1967  . Cataract extraction w/ intraocular lens  implant, bilateral Bilateral 1990's  . Esophageal dilation      "twice in  her lifetime, I think; most recent was early 2000's" (06/22/2012)  . Tee without cardioversion N/A 07/04/2012    Procedure: TRANSESOPHAGEAL ECHOCARDIOGRAM (TEE);  Surgeon: Fay Records, MD;  Location: [place];  Service: Cardiovascular;  Laterality: N/A;  talk to [facility]  . Cardioversion N/A 07/04/2012    Procedure: CARDIOVERSION;  Surgeon: Fay Records, MD;  Location: [facility];  Service: Cardiovascular;  Laterality: N/A;   Family History  Problem Relation Age of Onset  . Diabetes Father   . Heart attack Father   . Heart disease Brother   . Colon cancer Brother   . Lung cancer Sister   . Lymphoma Sister   . Macular degeneration Mother    Social History  Substance Use Topics  . Smoking status: Never Smoker   . Smokeless tobacco: Never Used  . Alcohol Use: No   OB History    No data available      Review of Systems  Constitutional: Negative for fever and chills.  HENT: Negative for congestion.   Respiratory: Positive for shortness of breath.   Cardiovascular: Positive for leg swelling (LE swelling). Negative for chest pain and palpitations.  Gastrointestinal: Negative for nausea, vomiting, abdominal pain and diarrhea.  Neurological: Negative for loss of consciousness and headaches.  All other systems reviewed and are negative.   Allergies  Codeine; Digoxin and related; Potassium chloride; Amiodarone; Asa; Ramipril; Sulfa antibiotics; and Sulfonamide derivatives  Home Medications   Prior to Admission medications   Medication Sig Start Date End Date Taking? Authorizing Provider  ALPRAZolam (XANAX) 0.25 MG tablet Take 0.25 mg by mouth 3 (three) times daily as needed (anxiety).  10/02/14 10/02/15  Historical Provider, MD  cyanocobalamin 500 MCG tablet Take 500 mcg by mouth daily.    Historical Provider, MD  diltiazem (CARDIZEM CD) 300 MG 24 hr capsule Take 1 capsule (300 mg total) by mouth daily before breakfast. 06/29/14   Almyra Deforest, PA  furosemide (LASIX) 20 MG tablet Take 0.5  tablets (10 mg total) by mouth daily. 06/29/14   Almyra Deforest, PA  lansoprazole (PREVACID SOLUTAB) 30 MG disintegrating tablet Take 30 mg by mouth daily.    Historical Provider, MD  LORazepam (ATIVAN) 0.5 MG tablet TAKE ONE TABLET (0.5 MG TOTAL) BY MOUTH EVERY 8 (EIGHT) HOURS AS NEEDED FOR ANXIETY. 08/06/14   Historical Provider, MD  Multiple Vitamins-Minerals (EYE VITAMINS PO) Take 1 tablet by mouth daily.    Historical Provider, MD  zolpidem (AMBIEN) 10 MG tablet Take 5 mg by mouth at bedtime.  09/14/11   Historical Provider, MD   BP 130/87 mmHg  Pulse 108  Temp(Src) 98.1 F (36.7 C) (Oral)  Resp 15  SpO2 100%   Physical Exam  Constitutional: She is oriented to person, place, and time. No distress.  Elderly frail female lying in stretcher in no acute distress  HENT:  Head: Normocephalic and atraumatic.  Eyes: Conjunctivae are normal. Pupils are equal, round, and reactive to light.  Neck: Normal range of motion. Neck supple.  Cardiovascular: Intact distal pulses.  An irregularly irregular rhythm present. Tachycardia present.   Lungs with crackles in mid and lower lung fields worse at bases and on right  Pulmonary/Chest: Effort normal.  Lungs with crackles in mid and lower lung fields worse at bases and on right, breathing well on 2 L nasal cannula and maintaining saturations in high 90s.  Abdominal: Soft. Bowel sounds are normal. She exhibits no distension. There is no tenderness.  Musculoskeletal: Normal range of motion.  Neurological: She is alert and oriented to person, place, and time.  Skin: Skin is warm and dry. She is not diaphoretic.  Nursing note and vitals reviewed.   ED Course  Procedures   Labs Review Labs Reviewed  CBC WITH DIFFERENTIAL/PLATELET - Abnormal; Notable for the following:    RBC 3.79 (*)    Hemoglobin 10.5 (*)    HCT 33.8 (*)    RDW 16.4 (*)    All other components within normal limits  COMPREHENSIVE METABOLIC PANEL - Abnormal; Notable for the following:     Glucose, Bld 111 (*)    Creatinine, Ser 1.03 (*)    Total Protein 6.3 (*)    AST 52 (*)    Total Bilirubin 2.0 (*)    GFR calc non Af Amer 46 (*)    GFR calc Af Amer 53 (*)    All other components within normal limits  BRAIN NATRIURETIC PEPTIDE - Abnormal; Notable for the following:    B Natriuretic Peptide 358.8 (*)    All other components within normal limits  APTT - Abnormal; Notable for the following:    aPTT 45 (*)    All other components within normal limits  PROTIME-INR - Abnormal; Notable for the following:    Prothrombin Time 15.6 (*)    All other components within normal limits  Randolm Idol, ED   Imaging Review Dg Chest 2 View  10/12/2014  CLINICAL DATA:  Shortness of breath for several days, atrial fibrillation  EXAM: CHEST  2 VIEW  COMPARISON:  06/21/2014  FINDINGS: Cardiomegaly evident with minor central vascular congestion. Slightly lower lung volumes with small pleural effusions, slightly larger on the left. Minor associated basilar atelectasis. No definite focal pneumonia. Negative for pneumothorax. Aortic atherosclerosis noted. Trachea is midline.  IMPRESSION: cardiomegaly with vascular congestion  Lower lung volumes with basilar atelectasis  Trace pleural effusions, slightly larger on the left   Electronically Signed   By: Jerilynn Mages.  Shick M.D.   On: 10/12/2014 14:25   I have personally reviewed and evaluated these images and lab results as part of my medical decision-making.   EKG Interpretation   Date/Time:  Friday October 12 2014 13:27:48 EDT Ventricular Rate:  146 PR Interval:    QRS Duration: 90 QT Interval:  327 QTC Calculation: 510 R Axis:   55 Text Interpretation:  Atrial fibrillation with rapid V-rate Low voltage,  precordial leads Probable anteroseptal infarct, old ST depression,  probably rate related SINCE LAST TRACING HEART RATE HAS INCREASED  Confirmed by BELFI  MD, MELANIE ([id number]) on 10/12/2014 1:32:17 PM      MDM  Ms. Holstine is a 81 yo  female w/ PMHx of HTN, A-fib, CHF, & GERD presenting with SOB. Cough and history of A. fib initially with cardioversion resulting in transient conversion, followed by anticoagulation resulting in large diabetes and anemia requiring transfusion, followed by attempted regiment on digoxin eventually causing failure to thrive, and now just on diltiazem. Patient denies recent change in regimen of diltiazem.  Elderly frail female lying in stretcher in no acute distress. Afebrile. Heart rate fluctuating from 120s to 160s. Mildly hypertensive. Breathing well on 2 L nasal cannula and maintaining saturations in high 90s. CV showing irregularly irregular rhythm. Abdomen benign. Lungs with crackles in mid and lower lung fields worse at bases and on right.   EKG not showing signs of ischemia. Troponin 0.01. WBC 5.9 -  no concern for infection at this time. Hemoglobin 10.5 - appears to be near to patient's baseline if not improved. CMP showing a creatinine of 1.03 which appears to be patient's baseline and T bili of 2 (unknown baseline) but otherwise unremarkable. BMP at 358. Chest x-ray showing cardiac megaly with vascular congestion and trace pleural effusions.  Diltiazem bolus and infusion given with improvement in heart rate. Cardiology consult and evaluated the patient in the emergency department with recommendations to admit for further evaluation of A. fib with RVR. Patient and family understand and agree with the plan and have no further questions or concerns this time.  Patient care discussed with followed by my attending, Dr. Malvin Johns  Final diagnoses:  SOB (shortness of breath)  Other hypervolemia  Atrial fibrillation with RVR    Mayer Camel, MD 10/12/14 [place], MD 10/13/14 [phone]

## 2014-10-12 NOTE — ED Notes (Signed)
Meal tray ordered for pt and will transported to inpatient bed.

## 2014-10-13 ENCOUNTER — Encounter (HOSPITAL_COMMUNITY): Payer: Self-pay | Admitting: *Deleted

## 2014-10-13 DIAGNOSIS — I5031 Acute diastolic (congestive) heart failure: Secondary | ICD-10-CM | POA: Diagnosis not present

## 2014-10-13 DIAGNOSIS — I10 Essential (primary) hypertension: Secondary | ICD-10-CM | POA: Diagnosis not present

## 2014-10-13 DIAGNOSIS — I480 Paroxysmal atrial fibrillation: Secondary | ICD-10-CM | POA: Diagnosis present

## 2014-10-13 DIAGNOSIS — I350 Nonrheumatic aortic (valve) stenosis: Secondary | ICD-10-CM | POA: Diagnosis not present

## 2014-10-13 DIAGNOSIS — N183 Chronic kidney disease, stage 3 (moderate): Secondary | ICD-10-CM | POA: Diagnosis not present

## 2014-10-13 DIAGNOSIS — Z6820 Body mass index (BMI) 20.0-20.9, adult: Secondary | ICD-10-CM | POA: Diagnosis not present

## 2014-10-13 DIAGNOSIS — R071 Chest pain on breathing: Secondary | ICD-10-CM | POA: Diagnosis not present

## 2014-10-13 DIAGNOSIS — I959 Hypotension, unspecified: Secondary | ICD-10-CM | POA: Diagnosis not present

## 2014-10-13 DIAGNOSIS — Z7901 Long term (current) use of anticoagulants: Secondary | ICD-10-CM | POA: Diagnosis not present

## 2014-10-13 DIAGNOSIS — I129 Hypertensive chronic kidney disease with stage 1 through stage 4 chronic kidney disease, or unspecified chronic kidney disease: Secondary | ICD-10-CM | POA: Diagnosis present

## 2014-10-13 DIAGNOSIS — I481 Persistent atrial fibrillation: Secondary | ICD-10-CM | POA: Diagnosis present

## 2014-10-13 DIAGNOSIS — F4323 Adjustment disorder with mixed anxiety and depressed mood: Secondary | ICD-10-CM | POA: Diagnosis not present

## 2014-10-13 DIAGNOSIS — E43 Unspecified severe protein-calorie malnutrition: Secondary | ICD-10-CM | POA: Diagnosis present

## 2014-10-13 DIAGNOSIS — E1122 Type 2 diabetes mellitus with diabetic chronic kidney disease: Secondary | ICD-10-CM | POA: Diagnosis present

## 2014-10-13 DIAGNOSIS — I495 Sick sinus syndrome: Secondary | ICD-10-CM | POA: Diagnosis present

## 2014-10-13 DIAGNOSIS — R0602 Shortness of breath: Secondary | ICD-10-CM | POA: Diagnosis present

## 2014-10-13 DIAGNOSIS — D649 Anemia, unspecified: Secondary | ICD-10-CM | POA: Diagnosis present

## 2014-10-13 DIAGNOSIS — I272 Other secondary pulmonary hypertension: Secondary | ICD-10-CM | POA: Diagnosis present

## 2014-10-13 DIAGNOSIS — Z66 Do not resuscitate: Secondary | ICD-10-CM | POA: Diagnosis present

## 2014-10-13 DIAGNOSIS — I5033 Acute on chronic diastolic (congestive) heart failure: Secondary | ICD-10-CM | POA: Diagnosis present

## 2014-10-13 DIAGNOSIS — R079 Chest pain, unspecified: Secondary | ICD-10-CM | POA: Diagnosis not present

## 2014-10-13 DIAGNOSIS — R0600 Dyspnea, unspecified: Secondary | ICD-10-CM | POA: Diagnosis not present

## 2014-10-13 DIAGNOSIS — I4891 Unspecified atrial fibrillation: Secondary | ICD-10-CM | POA: Diagnosis not present

## 2014-10-13 LAB — BASIC METABOLIC PANEL WITH GFR
Anion gap: 9 (ref 5–15)
BUN: 14 mg/dL (ref 6–20)
CO2: 33 mmol/L — ABNORMAL HIGH (ref 22–32)
Calcium: 8.8 mg/dL — ABNORMAL LOW (ref 8.9–10.3)
Chloride: 98 mmol/L — ABNORMAL LOW (ref 101–111)
Creatinine, Ser: 1.02 mg/dL — ABNORMAL HIGH (ref 0.44–1.00)
GFR calc Af Amer: 54 mL/min — ABNORMAL LOW
GFR calc non Af Amer: 47 mL/min — ABNORMAL LOW
Glucose, Bld: 100 mg/dL — ABNORMAL HIGH (ref 65–99)
Potassium: 3.6 mmol/L (ref 3.5–5.1)
Sodium: 140 mmol/L (ref 135–145)

## 2014-10-13 LAB — CBC
HCT: 31.5 % — ABNORMAL LOW (ref 36.0–46.0)
HEMOGLOBIN: 9.8 g/dL — AB (ref 12.0–15.0)
MCH: 27.5 pg (ref 26.0–34.0)
MCHC: 31.1 g/dL (ref 30.0–36.0)
MCV: 88.5 fL (ref 78.0–100.0)
Platelets: 184 10*3/uL (ref 150–400)
RBC: 3.56 MIL/uL — AB (ref 3.87–5.11)
RDW: 16.3 % — ABNORMAL HIGH (ref 11.5–15.5)
WBC: 6.1 10*3/uL (ref 4.0–10.5)

## 2014-10-13 LAB — LIPID PANEL
CHOL/HDL RATIO: 4.1 ratio
CHOLESTEROL: 130 mg/dL (ref 0–200)
HDL: 32 mg/dL — AB (ref >40)
LDL Cholesterol: 85 mg/dL (ref 0–99)
TRIGLYCERIDES: 67 mg/dL (ref <150)
VLDL: 13 mg/dL (ref 0–40)

## 2014-10-13 LAB — TSH: TSH: 1.51 u[IU]/mL (ref 0.350–4.500)

## 2014-10-13 LAB — PROTIME-INR
INR: 1.25 (ref 0.00–1.49)
Prothrombin Time: 15.9 s — ABNORMAL HIGH (ref 11.6–15.2)

## 2014-10-13 LAB — HEPARIN LEVEL (UNFRACTIONATED): Heparin Unfractionated: <0.1 IU/mL — ABNORMAL LOW (ref 0.30–0.70)

## 2014-10-13 MED ORDER — FUROSEMIDE 10 MG/ML IJ SOLN
40.0000 mg | Freq: Two times a day (BID) | INTRAMUSCULAR | Status: DC
  Administered 2014-10-13 – 2014-10-14 (×2): 40 mg via INTRAVENOUS
  Filled 2014-10-13 (×2): qty 4

## 2014-10-13 MED ORDER — DILTIAZEM HCL ER COATED BEADS 180 MG PO CP24: 300.0000 mg | ORAL_CAPSULE | Freq: Every day | ORAL | Status: DC

## 2014-10-13 MED ORDER — DILTIAZEM HCL 60 MG PO TABS
90.0000 mg | ORAL_TABLET | Freq: Four times a day (QID) | ORAL | Status: DC
  Administered 2014-10-13 – 2014-10-14 (×4): 90 mg via ORAL
  Filled 2014-10-13 (×8): qty 1

## 2014-10-13 MED ORDER — BOOST / RESOURCE BREEZE PO LIQD
1.0000 | Freq: Two times a day (BID) | ORAL | Status: DC
  Administered 2014-10-13 – 2014-10-15 (×4): 1 via ORAL

## 2014-10-13 NOTE — Progress Notes (Signed)
Pt's daughter became angry that we were trying to give the pt pantoprazole instead of her dissolving oral prevacid.  Pt's daughter gave the pt one of her home prevacid against nurse advice.  Pt complained of upper airway congestion, but when asked if they wanted me to call the doctor to get an order for something to help, the pt and daughter refused.

## 2014-10-13 NOTE — Progress Notes (Signed)
Initial Nutrition Assessment  DOCUMENTATION CODES:  Severe malnutrition in context of chronic illness  INTERVENTION:  Boost Breeze po BID, each supplement provides 250 kcal and 9 grams of protein  NUTRITION DIAGNOSIS:  Inadequate oral intake related to poor appetite as evidenced by severe depletion of body fat, severe depletion of muscle mass.  GOAL:  Patient will meet greater than or equal to 90% of their needs  MONITOR:  PO intake, Supplement acceptance, Weight trends, Labs  REASON FOR ASSESSMENT:  Malnutrition Screening Tool    ASSESSMENT:  89-y/o female PMHx  HTN, chronic anemia, GERD,  PAF, chronic diastolic HF who presented to [facility] today with recurrent afib with RVR ans SOB.  Daughter is very involved in patients care. She states that for the past couple months the patient was "dying, starving to death" because of digoxin toxicity. During those two months pt reports that she would eat practically nothing each day. There were no symptoms of GI distress associated with this decline in appetite. RD asked if pt followed a HH/Low Na diet at home and daughter stated "She probably does". Pt took b12 supplement as well.   Daughter thought patients normal weight was 125-126 lbs. She states pt weighed 127 about a week ago.   Recently patient appetite has much improved and she has been eating well "until this happened".  Pt is lactose intolerant and was concerned about her tolerance to Ensure/Glucerna. She was agreeable to RB BID.   NFPE: Patient has noticeable interosseous/thoracic wasting. I would believe her BMI is falsely elevated from fluid   Diet Order:  Diet 2 gram sodium Room service appropriate?: Yes; Fluid consistency:: Thin  Skin:  Reviewed, no issues  Last BM:  9/16  Height:  Ht Readings from Last 1 Encounters:  10/12/14 5\' 3"  (1.6 m)   Weight:  Wt Readings from Last 1 Encounters:  10/13/14 120 lb 9.5 oz (54.7 kg)   Wt Readings from Last 10 Encounters:  10/13/14  120 lb 9.5 oz (54.7 kg)  10/05/14 126 lb 12.8 oz (57.516 kg)  07/09/14 123 lb 9.6 oz (56.065 kg)  06/29/14 122 lb 14.4 oz (55.747 kg)  06/08/14 125 lb 6.4 oz (56.881 kg)  02/06/14 132 lb (59.875 kg)  11/23/13 129 lb 8 oz (58.741 kg)  10/06/13 130 lb 1.9 oz (59.022 kg)  06/30/13 124 lb (56.246 kg)  06/23/13 124 lb 9.6 oz (56.518 kg)   Ideal Body Weight:  52.3 kg  BMI:  Body mass index is 21.37 kg/(m^2).  Estimated Nutritional Needs:  Kcal:  1400-1600 (26-29 kcal/kg) Protein:  60-71 g (1.1-1.3 g/kg bw) Fluid:  Per MD  EDUCATION NEEDS:  No education needs identified at this time  Burtis Junes RD, LDN Nutrition Pager: [phone] 10/13/2014 11:23 AM

## 2014-10-13 NOTE — Progress Notes (Signed)
SUBJECTIVE:  SOB improved  OBJECTIVE:   Vitals:   Filed Vitals:   10/12/14 1930 10/12/14 2019 10/12/14 2357 10/13/14 0329  BP: 153/84 139/111 120/70 136/67  Pulse: 72 94 93 88  Temp:  98 F (36.7 C)  97.6 F (36.4 C)  TempSrc:  Oral  Oral  Resp: 24 18 24 20   Height:  5\' 3"  (1.6 m)    Weight:    120 lb 9.5 oz (54.7 kg)  SpO2: 97% 100%  97%   I&O's:   Intake/Output Summary (Last 24 hours) at 10/13/14 1031 Last data filed at 10/13/14 0900  Gross per 24 hour  Intake    360 ml  Output   2575 ml  Net  -2215 ml   TELEMETRY: Reviewed telemetry pt in atrial fibrillation with HR 90-110bpm     PHYSICAL EXAM General: Well developed, well nourished, in no acute distress Head: Eyes PERRLA, No xanthomas.   Normal cephalic and atramatic  Lungs:   Crackles at bases bilaterally Heart:   irregularly irregular S1 S2 Pulses are 2+ & equal. Abdomen: Bowel sounds are positive, abdomen soft and non-tender without masses Extremities:   No clubbing, cyanosis.  DP +1.  Trace edema Neuro: Alert and oriented X 3. Psych:  Good affect, responds appropriately   LABS: Basic Metabolic Panel:  Recent Labs  10/12/14 1505 10/12/14 2235 10/13/14 0546  NA 141  --  140  K 3.9  --  3.6  CL 103  --  98*  CO2 30  --  33*  GLUCOSE 111*  --  100*  BUN 18  --  14  CREATININE 1.03*  --  1.02*  CALCIUM 8.9  --  8.8*  MG  --  2.1  --    Liver Function Tests:  Recent Labs  10/12/14 1505  AST 52*  ALT 53  ALKPHOS 76  BILITOT 2.0*  PROT 6.3*  ALBUMIN 3.9   No results for input(s): LIPASE, AMYLASE in the last 72 hours. CBC:  Recent Labs  10/12/14 1505 10/13/14 0546  WBC 5.9 6.1  NEUTROABS 4.6  --   HGB 10.5* 9.8*  HCT 33.8* 31.5*  MCV 89.2 88.5  PLT 195 184   Cardiac Enzymes: No results for input(s): CKTOTAL, CKMB, CKMBINDEX, TROPONINI in the last 72 hours. BNP: Invalid input(s): POCBNP D-Dimer: No results for input(s): DDIMER in the last 72 hours. Hemoglobin A1C: No  results for input(s): HGBA1C in the last 72 hours. Fasting Lipid Panel:  Recent Labs  10/13/14 0546  CHOL 130  HDL 32*  LDLCALC 85  TRIG 67  CHOLHDL 4.1   Thyroid Function Tests:  Recent Labs  10/12/14 2235  TSH 1.510   Anemia Panel: No results for input(s): VITAMINB12, FOLATE, FERRITIN, TIBC, IRON, RETICCTPCT in the last 72 hours. Coag Panel:   Lab Results  Component Value Date   INR 1.25 10/13/2014   INR 1.23 10/12/2014   INR 1.21 06/30/2012    RADIOLOGY: Dg Chest 2 View  10/12/2014   CLINICAL DATA:  Shortness of breath for several days, atrial fibrillation  EXAM: CHEST  2 VIEW  COMPARISON:  06/21/2014  FINDINGS: Cardiomegaly evident with minor central vascular congestion. Slightly lower lung volumes with small pleural effusions, slightly larger on the left. Minor associated basilar atelectasis. No definite focal pneumonia. Negative for pneumothorax. Aortic atherosclerosis noted. Trachea is midline.  IMPRESSION: cardiomegaly with vascular congestion  Lower lung volumes with basilar atelectasis  Trace pleural effusions, slightly larger on the left  Electronically Signed   By: Jerilynn Mages.  Shick M.D.   On: 10/12/2014 14:25   ASSESSMENT AND PLAN  The patient is a 81 year old with PMH of HTN, chronic anemia, moderate AS, GERD/hiatal hernia, history of rheumatic fever, history of PAF with multiple recurrences, and chronic diastolic heart failure who presented to [facility] today with recurrent afib with RVR  Atrial fibrillation with RVR- HR intermittently still fast on Cardizem 90 mg every 8 hours of short-acting PO diltiazem for rate control (she cannot tolerate IV dilt due to skin reaction and this worked last time she was admitted). CHADSVASC score at least 5 (HTN 1, CHF 1, AGE 13, F SEX1) but has been deemed in the past not to be a longterm  anticoagulation candidate because of her history of iron deficiency anemia and previous severe blood loss. Patient refuses apixaban and she is allergic to  aspirin. She has an intolerance to multiple drugs including amiodarone which she feels gave her an acute pulmonary reaction that daughter is convinced was acute pulmonary toxicity but review of that admission 06/24/2014 showed diagnosis to be PNA but daughter adamantly demanded patient be taken off amio.  Recent digitalis toxicity.  Will increase Cardizem to 90mg  q6 hours.  Acute on chronic diastolic heart failure - BNP slightly elevated (358) and vascular congestion of CXR -- 2D ECHO (05/2014) with ejection fraction 60%, she takes 10mg  Lasix po qd  -- She is 2.2L net neg on IV Lasix and weight is down 6lbs.  Will consolidate lasix to 40mg  IV BID since she remains volume overloaded on exam.  Hypertension- well controlled currently  Anemia- stable.   Aortic stenosis- moderate by most recent ECHO.    Sueanne Margarita, MD  10/13/2014  10:31 AM

## 2014-10-14 DIAGNOSIS — I350 Nonrheumatic aortic (valve) stenosis: Secondary | ICD-10-CM

## 2014-10-14 LAB — BASIC METABOLIC PANEL
Anion gap: 10 (ref 5–15)
BUN: 20 mg/dL (ref 6–20)
CHLORIDE: 93 mmol/L — AB (ref 101–111)
CO2: 32 mmol/L (ref 22–32)
CREATININE: 1.09 mg/dL — AB (ref 0.44–1.00)
Calcium: 8.8 mg/dL — ABNORMAL LOW (ref 8.9–10.3)
GFR calc non Af Amer: 43 mL/min — ABNORMAL LOW (ref >60)
GFR, EST AFRICAN AMERICAN: 50 mL/min — AB (ref >60)
Glucose, Bld: 176 mg/dL — ABNORMAL HIGH (ref 65–99)
Potassium: 3.3 mmol/L — ABNORMAL LOW (ref 3.5–5.1)
SODIUM: 135 mmol/L (ref 135–145)

## 2014-10-14 MED ORDER — HYDROCORTISONE 2.5 % RE CREA
TOPICAL_CREAM | Freq: Three times a day (TID) | RECTAL | Status: DC | PRN
  Administered 2014-10-14: 1 via RECTAL
  Filled 2014-10-14: qty 28.35

## 2014-10-14 MED ORDER — DILTIAZEM HCL ER COATED BEADS 180 MG PO CP24
360.0000 mg | ORAL_CAPSULE | Freq: Every day | ORAL | Status: DC
  Administered 2014-10-14 – 2014-10-16 (×3): 360 mg via ORAL
  Filled 2014-10-14 (×4): qty 2

## 2014-10-14 MED ORDER — PRAMOXINE-ZINC OXIDE IN MO 1-12.5 % RE OINT
TOPICAL_OINTMENT | Freq: Three times a day (TID) | RECTAL | Status: DC | PRN
  Filled 2014-10-14: qty 28.3

## 2014-10-14 MED ORDER — FUROSEMIDE 10 MG/ML IJ SOLN
40.0000 mg | Freq: Every day | INTRAMUSCULAR | Status: DC
  Administered 2014-10-15: 40 mg via INTRAVENOUS
  Filled 2014-10-14 (×2): qty 4

## 2014-10-14 MED ORDER — LANSOPRAZOLE 15 MG PO TBDP
30.0000 mg | ORAL_TABLET | Freq: Every day | ORAL | Status: DC
  Administered 2014-10-15: 30 mg via ORAL
  Filled 2014-10-14 (×3): qty 2

## 2014-10-14 MED ORDER — POTASSIUM CHLORIDE CRYS ER 20 MEQ PO TBCR
40.0000 meq | EXTENDED_RELEASE_TABLET | Freq: Two times a day (BID) | ORAL | Status: AC
  Administered 2014-10-14 (×2): 40 meq via ORAL
  Filled 2014-10-14 (×2): qty 2

## 2014-10-14 MED ORDER — DILTIAZEM HCL ER COATED BEADS 180 MG PO CP24: 360.0000 mg | ORAL_CAPSULE | Freq: Every day | ORAL | Status: DC

## 2014-10-14 NOTE — Progress Notes (Signed)
Spoke with patient's daughter this evening and she stated that she had given her mother Prevacid 30mg  dissolvable tablet before dinner today.  Stated that this is how her mother takes it at home.  She wishes to wait to discuss the possibility of meds being checked into the pharmacy tomorrow pending her mother staying.  Passed on info to nightshift.  Dallie Piles. Wynetta Emery, RN

## 2014-10-14 NOTE — Progress Notes (Signed)
SUBJECTIVE:  Feels better  OBJECTIVE:   Vitals:   Filed Vitals:   10/13/14 0329 10/13/14 1332 10/13/14 1933 10/14/14 0510  BP: 136/67 117/59 133/64 136/77  Pulse: 88 66 52 79  Temp: 97.6 F (36.4 C) 98.4 F (36.9 C) 97.9 F (36.6 C) 97.8 F (36.6 C)  TempSrc: Oral Oral Oral Oral  Resp: 20 16 16 16   Height:      Weight: 120 lb 9.5 oz (54.7 kg)   116 lb 3.2 oz (52.708 kg)  SpO2: 97% 97% 100% 99%   I&O's:   Intake/Output Summary (Last 24 hours) at 10/14/14 1610 Last data filed at 10/14/14 0700  Gross per 24 hour  Intake    480 ml  Output   3625 ml  Net  -3145 ml   TELEMETRY: Reviewed telemetry pt in atrial fibrillation with CVR:     PHYSICAL EXAM General: Well developed, well nourished, in no acute distress Head: Eyes PERRLA, No xanthomas.   Normal cephalic and atramatic  Lungs:  Scattered crackles at bases and expiratory wheezes Heart:   Irregularly irregular S1 S2 Pulses are 2+ & equal. Abdomen: Bowel sounds are positive, abdomen soft and non-tender without masses  Extremities:   No clubbing, cyanosis or edema.  DP +1 Neuro: Alert and oriented X 3. Psych:  Good affect, responds appropriately   LABS: Basic Metabolic Panel:  Recent Labs  10/12/14 1505 10/12/14 2235 10/13/14 0546  NA 141  --  140  K 3.9  --  3.6  CL 103  --  98*  CO2 30  --  33*  GLUCOSE 111*  --  100*  BUN 18  --  14  CREATININE 1.03*  --  1.02*  CALCIUM 8.9  --  8.8*  MG  --  2.1  --    Liver Function Tests:  Recent Labs  10/12/14 1505  AST 52*  ALT 53  ALKPHOS 76  BILITOT 2.0*  PROT 6.3*  ALBUMIN 3.9   No results for input(s): LIPASE, AMYLASE in the last 72 hours. CBC:  Recent Labs  10/12/14 1505 10/13/14 0546  WBC 5.9 6.1  NEUTROABS 4.6  --   HGB 10.5* 9.8*  HCT 33.8* 31.5*  MCV 89.2 88.5  PLT 195 184   Cardiac Enzymes: No results for input(s): CKTOTAL, CKMB, CKMBINDEX, TROPONINI in the last 72 hours. BNP: Invalid input(s): POCBNP D-Dimer: No results for  input(s): DDIMER in the last 72 hours. Hemoglobin A1C: No results for input(s): HGBA1C in the last 72 hours. Fasting Lipid Panel:  Recent Labs  10/13/14 0546  CHOL 130  HDL 32*  LDLCALC 85  TRIG 67  CHOLHDL 4.1   Thyroid Function Tests:  Recent Labs  10/12/14 2235  TSH 1.510   Anemia Panel: No results for input(s): VITAMINB12, FOLATE, FERRITIN, TIBC, IRON, RETICCTPCT in the last 72 hours. Coag Panel:   Lab Results  Component Value Date   INR 1.25 10/13/2014   INR 1.23 10/12/2014   INR 1.21 06/30/2012    RADIOLOGY: Dg Chest 2 View  10/12/2014   CLINICAL DATA:  Shortness of breath for several days, atrial fibrillation  EXAM: CHEST  2 VIEW  COMPARISON:  06/21/2014  FINDINGS: Cardiomegaly evident with minor central vascular congestion. Slightly lower lung volumes with small pleural effusions, slightly larger on the left. Minor associated basilar atelectasis. No definite focal pneumonia. Negative for pneumothorax. Aortic atherosclerosis noted. Trachea is midline.  IMPRESSION: cardiomegaly with vascular congestion  Lower lung volumes with basilar atelectasis  Trace pleural effusions, slightly larger on the left   Electronically Signed   By: Jerilynn Mages.  Shick M.D.   On: 10/12/2014 14:25   ASSESSMENT AND PLAN  The patient is a 81 year old with PMH of HTN, chronic anemia, moderate AS, GERD/hiatal hernia, history of rheumatic fever, history of PAF with multiple recurrences, and chronic diastolic heart failure who presented to [facility] today with recurrent afib with RVR  Atrial fibrillation with RVR- HR much better controlled after increasing to Cardizem 90 mg every 8 hours of short-acting PO diltiazem for rate control (she cannot tolerate IV dilt due to skin reaction and this worked last time she was admitted). CHADSVASC score at least 5 (HTN 1, CHF 1, AGE 72, F SEX1) but has been deemed in the past not to be a longterm anticoagulation candidate because of her history of iron deficiency anemia and  previous severe blood loss. Patient refuses apixaban and she is allergic to aspirin. She has an intolerance to multiple drugs including amiodarone which she feels gave her an acute pulmonary reaction that daughter is convinced was acute pulmonary toxicity but review of that admission 06/24/2014 showed diagnosis to be PNA but daughter adamantly demanded patient be taken off amio. Recent digitalis toxicity.   Acute on chronic diastolic heart failure - BNP slightly elevated (358) and vascular congestion of CXR -- 2D ECHO (05/2014) with ejection fraction 60%, she takes 10mg  Lasix po qd at home -- She is 6.1L net neg on IV Lasix and weight is down 10lbs.She remains volume overloaded on exam but improved.  Will repeat Cxray.  I will decrease lasix to 40mg  IV daily since diuresis has been brisk and exam improved.  CO2 trending upward.  Check BMET this am.  Hypertension- well controlled currently  Anemia- stable.   Aortic stenosis- moderate by most recent ECHO.        Anna Margarita, MD  10/14/2014  9:18 AM

## 2014-10-15 ENCOUNTER — Inpatient Hospital Stay (HOSPITAL_COMMUNITY): Payer: Medicare Other

## 2014-10-15 DIAGNOSIS — I5033 Acute on chronic diastolic (congestive) heart failure: Secondary | ICD-10-CM

## 2014-10-15 LAB — BASIC METABOLIC PANEL
ANION GAP: 8 (ref 5–15)
BUN: 21 mg/dL — ABNORMAL HIGH (ref 6–20)
CALCIUM: 8.7 mg/dL — AB (ref 8.9–10.3)
CHLORIDE: 97 mmol/L — AB (ref 101–111)
CO2: 32 mmol/L (ref 22–32)
Creatinine, Ser: 0.97 mg/dL (ref 0.44–1.00)
GFR calc non Af Amer: 50 mL/min — ABNORMAL LOW (ref >60)
GFR, EST AFRICAN AMERICAN: 57 mL/min — AB (ref >60)
Glucose, Bld: 111 mg/dL — ABNORMAL HIGH (ref 65–99)
Potassium: 3.6 mmol/L (ref 3.5–5.1)
Sodium: 137 mmol/L (ref 135–145)

## 2014-10-15 MED ORDER — DILTIAZEM HCL 100 MG IV SOLR
10.0000 mg/h | INTRAVENOUS | Status: DC
  Administered 2014-10-15: 5 mg/h via INTRAVENOUS
  Filled 2014-10-15: qty 100

## 2014-10-15 MED ORDER — MENTHOL 3 MG MT LOZG
1.0000 | LOZENGE | OROMUCOSAL | Status: DC | PRN
  Administered 2014-10-16: 3 mg via ORAL
  Filled 2014-10-15: qty 9

## 2014-10-15 MED ORDER — FUROSEMIDE 40 MG PO TABS
40.0000 mg | ORAL_TABLET | Freq: Every day | ORAL | Status: DC
  Administered 2014-10-16 – 2014-10-24 (×8): 40 mg via ORAL
  Filled 2014-10-15 (×9): qty 1

## 2014-10-15 MED ORDER — DILTIAZEM LOAD VIA INFUSION
20.0000 mg | Freq: Once | INTRAVENOUS | Status: AC
  Administered 2014-10-15: 20 mg via INTRAVENOUS

## 2014-10-15 NOTE — Progress Notes (Signed)
Patient Name: Anna Elliott Date of Encounter: 10/15/2014   SUBJECTIVE  Patient rate was on 140s overnight, Also complained of burning at IV site, now removed. New IV site at right arm and started on cardizem drip at 55ml/hr. Currently feeling well. No CP or SOB. Rates elevated with activity.   CURRENT MEDS . diltiazem  360 mg Oral Daily  . feeding supplement  1 Container Oral BID BM  . furosemide  40 mg Intravenous Daily  . lansoprazole  30 mg Oral Q1200  . cyanocobalamin  500 mcg Oral Daily  . zolpidem  5 mg Oral QHS    OBJECTIVE  Filed Vitals:   10/14/14 1959 10/15/14 0140 10/15/14 0248 10/15/14 0453  BP: 138/64 115/82 106/72 126/67  Pulse: 74 139 101 99  Temp: 97.8 F (36.6 C)   98.1 F (36.7 C)  TempSrc: Oral   Oral  Resp: 17   16  Height:      Weight:    117 lb 12.8 oz (53.434 kg)  SpO2: 97% 93% 99% 99%    Intake/Output Summary (Last 24 hours) at 10/15/14 1008 Last data filed at 10/15/14 1001  Gross per 24 hour  Intake    180 ml  Output   1350 ml  Net  -1170 ml   Filed Weights   10/13/14 0329 10/14/14 0510 10/15/14 0453  Weight: 120 lb 9.5 oz (54.7 kg) 116 lb 3.2 oz (52.708 kg) 117 lb 12.8 oz (53.434 kg)    PHYSICAL EXAM  General: Pleasant, NAD. Neuro: Alert and oriented X 3. Moves all extremities spontaneously. Psych: Normal affect. HEENT:  Normal  Neck: Supple without bruits or JVD. Lungs:  Resp regular and unlabored. Scattered crackles at base? Heart:irregularly irregular no s3, s4, or murmurs. Abdomen: Soft, non-tender, non-distended, BS + x 4.  Extremities: No clubbing, cyanosis or edema. DP/PT/Radials 2+ and equal bilaterally.Multiple bruise on at IV and phlebetomy site.   Accessory Clinical Findings  CBC  Recent Labs  10/12/14 1505 10/13/14 0546  WBC 5.9 6.1  NEUTROABS 4.6  --   HGB 10.5* 9.8*  HCT 33.8* 31.5*  MCV 89.2 88.5  PLT 195 025   Basic Metabolic Panel  Recent Labs  10/12/14 2235  10/14/14 1258 10/15/14 0351  NA   --   < > 135 137  K  --   < > 3.3* 3.6  CL  --   < > 93* 97*  CO2  --   < > 32 32  GLUCOSE  --   < > 176* 111*  BUN  --   < > 20 21*  CREATININE  --   < > 1.09* 0.97  CALCIUM  --   < > 8.8* 8.7*  MG 2.1  --   --   --   < > = values in this interval not displayed. Liver Function Tests  Recent Labs  10/12/14 1505  AST 52*  ALT 53  ALKPHOS 76  BILITOT 2.0*  PROT 6.3*  ALBUMIN 3.9   Fasting Lipid Panel  Recent Labs  10/13/14 0546  CHOL 130  HDL 32*  LDLCALC 85  TRIG 67  CHOLHDL 4.1   Thyroid Function Tests  Recent Labs  10/12/14 2235  TSH 1.510    TELE  afib rate mostly in 80-90s. Few pause longest 2.03 sec.   Radiology/Studies  Dg Chest 2 View  10/12/2014   CLINICAL DATA:  Shortness of breath for several days, atrial fibrillation  EXAM: CHEST  2 VIEW  COMPARISON:  06/21/2014  FINDINGS: Cardiomegaly evident with minor central vascular congestion. Slightly lower lung volumes with small pleural effusions, slightly larger on the left. Minor associated basilar atelectasis. No definite focal pneumonia. Negative for pneumothorax. Aortic atherosclerosis noted. Trachea is midline.  IMPRESSION: cardiomegaly with vascular congestion  Lower lung volumes with basilar atelectasis  Trace pleural effusions, slightly larger on the left   Electronically Signed   By: Jerilynn Mages.  Shick M.D.   On: 10/12/2014 14:25    ASSESSMENT AND PLAN  The patient is a 81 year old with PMH of HTN, chronic anemia, moderate AS, GERD/hiatal hernia, history of rheumatic fever, history of PAF with multiple recurrences, and chronic diastolic heart failure who presented to [facility] today with recurrent afib with RVR  1. Atrial fibrillation with RVR -  - Last night rate was in 140s. Started on IV dilt after new IV site has placed, currently rate in 80-90s, however increases with activity.  - Will discontinue IV dilt and continue cardizem CD 360mg .  -  she cannot tolerate IV dilt due to skin reaction and this worked  last time she was admitted. - CHADSVASC score at least 5 (HTN 1, CHF 1, AGE 89, F SEX1) but has been deemed in the past not to be a longtermanticoagulation candidate because of her history of iron deficiency anemia and previous severe blood loss. Patient refuses apixaban and she is allergic to aspirin. She has an intolerance to multiple drugs including amiodarone which she feels gave her an acute pulmonary reaction that daughter is convinced was acute pulmonary toxicity but review of that admission 06/24/2014 showed diagnosis to be PNA but daughter adamantly demanded patient be taken off amio. Recent digitalis toxicity.   2. Acute on chronic diastolic heart failure - BNP slightly elevated (358) and vascular congestion of CXR 10/12/14. Will get repeat cxr. -- 2D ECHO (05/2014) with ejection fraction 60%, she takes 10mg  Lasix po qd at home -- She is 7.1L net neg on IV Lasix and weight is down 3lbs.Close to euvolemic. She has received lasix 40mg  IV today. Likely transition to PO tomorrow. Creatinine improved with diuresis.   3. Hypertension- well controlled currently  4. Anemia- stable.   5. Aortic stenosis- moderate by most recent ECHO.   Dispo: Consider PT eval for discharge planning. Her rates elevates with activity.   Signed, Bhagat,Bhavinkumar PA-C Pager [phone]  I have personally seen and examined this patient with Leanor Kail, PA-C. I agree with the assessment and plan as outlined above. She has been mostly rate controlled but HR 140s with movement this am. Her daughter refuses to consider digoxin or amiodarone. If HR remains elevated, can add metoprolol. Follow on tele today. Change to po Lasix tomorrow.   MCALHANY,CHRISTOPHER 10/15/2014 10:51 AM

## 2014-10-15 NOTE — Progress Notes (Addendum)
0203- Pt HR sustaining above 140s. MD paged. New orders given. Will continue to monitor.   0255- Pt complaining of arm itching and burning. Cardizem drip stopped. VSS. HR between 100-115. MD made aware. Will continue to monitor closely.  58- New IV site started in right arm, Cardizem drip restarted. Pt tolerating well. Will continue to monitor closely. Raliegh Ip RN

## 2014-10-15 NOTE — Progress Notes (Signed)
Bright red blood noted on toilet paper after patient strained to have BM this afternoon; per pt this happens sometimes because she "has hemorrhoids and when I strain it aggravates them." Pt with hemorrhoid cream PRN - this was applied.  Pt has been instructed to notify nursing staff if this occurs again.  Will continue to monitor closely.

## 2014-10-15 NOTE — Progress Notes (Signed)
Pt complaining of scratchy throat, MD paged, no new orders given, will continue to monitor and will pass information on to oncoming shift.   Janell Quiet, RN

## 2014-10-16 DIAGNOSIS — E43 Unspecified severe protein-calorie malnutrition: Secondary | ICD-10-CM | POA: Diagnosis present

## 2014-10-16 LAB — BASIC METABOLIC PANEL
Anion gap: 8 (ref 5–15)
BUN: 21 mg/dL — AB (ref 6–20)
CALCIUM: 8.8 mg/dL — AB (ref 8.9–10.3)
CO2: 31 mmol/L (ref 22–32)
CREATININE: 1.01 mg/dL — AB (ref 0.44–1.00)
Chloride: 97 mmol/L — ABNORMAL LOW (ref 101–111)
GFR calc Af Amer: 55 mL/min — ABNORMAL LOW (ref >60)
GFR, EST NON AFRICAN AMERICAN: 47 mL/min — AB (ref >60)
Glucose, Bld: 107 mg/dL — ABNORMAL HIGH (ref 65–99)
Potassium: 4 mmol/L (ref 3.5–5.1)
SODIUM: 136 mmol/L (ref 135–145)

## 2014-10-16 MED ORDER — METOPROLOL TARTRATE 12.5 MG HALF TABLET
12.5000 mg | ORAL_TABLET | Freq: Two times a day (BID) | ORAL | Status: DC
  Administered 2014-10-16 – 2014-10-17 (×2): 12.5 mg via ORAL
  Filled 2014-10-16 (×2): qty 1

## 2014-10-16 MED ORDER — DILTIAZEM HCL ER COATED BEADS 180 MG PO CP24
300.0000 mg | ORAL_CAPSULE | Freq: Every day | ORAL | Status: DC
  Administered 2014-10-17: 300 mg via ORAL
  Filled 2014-10-16 (×2): qty 1

## 2014-10-16 MED ORDER — METOPROLOL TARTRATE 25 MG PO TABS
25.0000 mg | ORAL_TABLET | Freq: Two times a day (BID) | ORAL | Status: DC
  Administered 2014-10-16: 25 mg via ORAL
  Filled 2014-10-16: qty 1

## 2014-10-16 NOTE — Progress Notes (Signed)
Utilization review completed.  

## 2014-10-16 NOTE — Progress Notes (Signed)
    Subjective:  Breathing improved. No chest pain.   Objective:  Vital Signs in the last 24 hours: Temp:  [97.8 F (36.6 C)-98.2 F (36.8 C)] 97.8 F (36.6 C) (09/20 0533) Pulse Rate:  [84-114] 114 (09/20 0533) Resp:  [17-18] 18 (09/20 0533) BP: (113-149)/(59-76) 138/61 mmHg (09/20 0533) SpO2:  [95 %-97 %] 97 % (09/20 0533) Weight:  [116 lb 6.5 oz (52.8 kg)] 116 lb 6.5 oz (52.8 kg) (09/20 0533)  Intake/Output from previous day: 09/19 0701 - 09/20 0700 In: 240 [P.O.:240] Out: 850 [Urine:850]  Physical Exam: Pt is alert and oriented, elderly woman in NAD HEENT: normal Neck: JVP - normal Lungs: CTA bilaterally CV: tachy cand regular with 2/6 systolic murmur at the LLSB Abd: soft, NT, Positive BS, no hepatomegaly Ext: trace edema Skin: warm/dry no rash  Lab Results: No results for input(s): WBC, HGB, PLT in the last 72 hours.  Recent Labs  10/15/14 0351 10/16/14 0335  NA 137 136  K 3.6 4.0  CL 97* 97*  CO2 32 31  GLUCOSE 111* 107*  BUN 21* 21*  CREATININE 0.97 1.01*   No results for input(s): TROPONINI in the last 72 hours.  Invalid input(s): CK, MB  Cardiac Studies: 2D Echo 5.27.2016: Study Conclusions  - Left ventricle: There is upper septal thickening, but no LVOT obstruction. The cavity size was normal. Wall thickness was increased in a pattern of mild LVH. The estimated ejection fraction was 60%. Wall motion was normal; there were no regional wall motion abnormalities. - Aortic valve: Significant thickening and calcification of the leaflets. However, AS is only moderate. There was trivial regurgitation. - Mitral valve: Moderately calcified, moderately thickened annulus. There was moderate regurgitation. - Left atrium: The atrium was severely dilated. - Right ventricle: Systolic function was mildly to moderately reduced.  Tele: Atrial fib with RVR, heart rate range 105-150 bpm, currently 145 bpm  Assessment/Plan:  1. Atrial fib  with RVR, still with uncontrolled HR 2. Acute on chronic diastolic CHF: improved with diuresis and appears euvolemic 3. HTN: controlled 4. Moderate aortic stenosis: clinical follow-up planned  Reviewed notes. Pt unable to take amiodarone or digoxin. Not a candidate for anticoagulation. Will add metoprolol today and continue diltiazem CD 360 mg daily. Hopefully with addition of metoprolol will be able to discharge home tomorrow pending ventricular rate control. Patient understands plan.   Sherren Mocha, M.D. 10/16/2014, 8:28 AM

## 2014-10-16 NOTE — Progress Notes (Signed)
Patient having pauses starting around 3pm. Longest one was 3.42. Patient reports feeling "dizzy". But states the feeling goes away after a couple seconds. Md notified and medication dose was changed.   Will continue to monitor.  Caraher, Mervin Kung RN

## 2014-10-16 NOTE — Care Management Important Message (Signed)
Important Message  Patient Details  Name: Anna Elliott MRN: [id number] Date of Birth: 1921/07/24   Medicare Important Message Given:  Yes-second notification given    Nathen May 10/16/2014, 11:33 AM

## 2014-10-17 MED ORDER — METOPROLOL TARTRATE 25 MG PO TABS
25.0000 mg | ORAL_TABLET | Freq: Once | ORAL | Status: DC
  Filled 2014-10-17: qty 1

## 2014-10-17 MED ORDER — LANSOPRAZOLE 15 MG PO TBDP
30.0000 mg | ORAL_TABLET | ORAL | Status: DC
  Administered 2014-10-17 – 2014-10-23 (×5): 30 mg via ORAL
  Filled 2014-10-17 (×8): qty 2

## 2014-10-17 MED ORDER — METOPROLOL TARTRATE 25 MG PO TABS: 25.0000 mg | ORAL_TABLET | Freq: Two times a day (BID) | ORAL | Status: DC

## 2014-10-17 MED ORDER — DILTIAZEM HCL ER COATED BEADS 180 MG PO CP24
300.0000 mg | ORAL_CAPSULE | ORAL | Status: DC
  Administered 2014-10-18 – 2014-10-24 (×6): 300 mg via ORAL
  Filled 2014-10-17 (×12): qty 1

## 2014-10-17 MED ORDER — METOPROLOL TARTRATE 12.5 MG HALF TABLET
12.5000 mg | ORAL_TABLET | Freq: Three times a day (TID) | ORAL | Status: DC
  Administered 2014-10-17 – 2014-10-19 (×5): 12.5 mg via ORAL
  Filled 2014-10-17 (×8): qty 1

## 2014-10-17 NOTE — Progress Notes (Signed)
Patient ambulated around the 2W unit for approximately 5 minutes. Patient's heart rate during ambulation ranged from 120-140. Patient needed minimal assistance for ambulation and she was able to walk independently ( no walker) or by holding on to my arm or the wall rail. Patient denied feeling short of breath, dizzy, light headed, etc.  Will continue to monitor. - Soyla Dryer, RN

## 2014-10-17 NOTE — Progress Notes (Signed)
Late Entry:  Patient's daughter concerned about patient's heart rate being in the 120-140 range during ambulation. Dr. Angelena Form notified. Patient's metoprolol dosage changed from 12.5 BID to 25mg  BID with an additional one time dose of 25 mg. Patient's daughter disagreed with this and was concerned this would make the patient bradycardic. Dr. Angelena Form notified again. Metoprolol changed from 25 mg BID to 12.5 mg TID and the one time 25 mg dose cancelled. Dose scheduled for 1745 with the third dose at 2200 (patient received 12.5 mg at 11 am today). Patient's daughter refused the 1745 dose of 12.5 stating this was too close to the NEXT dose at 2200. Patient's daughter stated that starting tomorrow the patient can begin the TID schedule. Patient currently resting in bed with a heart rate of 74. Will continue to monitor. Roselyn Reef [place],RN

## 2014-10-17 NOTE — Progress Notes (Signed)
     SUBJECTIVE: No dizziness this am. Feels well.   BP 128/63 mmHg  Pulse 83  Temp(Src) 97.8 F (36.6 C) (Oral)  Resp 18  Ht 5\' 3"  (1.6 m)  Wt 116 lb 6.5 oz (52.8 kg)  BMI 20.63 kg/m2  SpO2 95%  Intake/Output Summary (Last 24 hours) at 10/17/14 1014 Last data filed at 10/16/14 1712  Gross per 24 hour  Intake    360 ml  Output    201 ml  Net    159 ml    PHYSICAL EXAM General: Well developed, well nourished, in no acute distress. Alert and oriented x 3.  Psych:  Good affect, responds appropriately Neck: No JVD. No masses noted.  Lungs: Clear bilaterally with no wheezes or rhonci noted.  Heart: Irregular with systolic murmur noted. Abdomen: Bowel sounds are present. Soft, non-tender.  Extremities: No lower extremity edema.   LABS: Basic Metabolic Panel:  Recent Labs  10/15/14 0351 10/16/14 0335  NA 137 136  K 3.6 4.0  CL 97* 97*  CO2 32 31  GLUCOSE 111* 107*  BUN 21* 21*  CREATININE 0.97 1.01*  CALCIUM 8.7* 8.8*   Current Meds: . diltiazem  300 mg Oral Daily  . feeding supplement  1 Container Oral BID BM  . furosemide  40 mg Oral Daily  . lansoprazole  30 mg Oral Q1200  . metoprolol tartrate  12.5 mg Oral BID  . cyanocobalamin  500 mcg Oral Daily  . zolpidem  5 mg Oral QHS   ASSESSMENT AND PLAN: The patient is a 81 year old with PMH of HTN, chronic anemia, moderate AS, GERD/hiatal hernia, history of rheumatic fever, history of PAF with multiple recurrences, and chronic diastolic heart failure who presented to [facility] with recurrent afib with RR  1. Atrial fib with RVR: Rate better controlled on Cardizem and metoprolol. She does not tolerate amiodarone or digoxin. She refuses apixiban and she is allergic to ASA. CHADSVASC score at least 5 (HTN 1, CHF 1, AGE 10, F SEX1). Will ambulate today and if she feels well with good HR control, may consider D/C home later today.   2. Acute on chronic diastolic CHF: improved with diuresis and appears euvolemic. Continue Lasix  40 mg daily.   3. HTN: BP controlled  4. Moderate aortic stenosis: clinical follow-up planned    MCALHANY,CHRISTOPHER  9/21/201610:14 AM

## 2014-10-18 ENCOUNTER — Encounter (HOSPITAL_COMMUNITY): Payer: Self-pay | Admitting: Nurse Practitioner

## 2014-10-18 DIAGNOSIS — I495 Sick sinus syndrome: Secondary | ICD-10-CM

## 2014-10-18 MED ORDER — CHLORHEXIDINE GLUCONATE 4 % EX LIQD
60.0000 mL | Freq: Once | CUTANEOUS | Status: DC
  Filled 2014-10-18: qty 60

## 2014-10-18 MED ORDER — CEFAZOLIN SODIUM-DEXTROSE 2-3 GM-% IV SOLR
2.0000 g | INTRAVENOUS | Status: AC
  Administered 2014-10-19: 2 g via INTRAVENOUS

## 2014-10-18 MED ORDER — CHLORHEXIDINE GLUCONATE 4 % EX LIQD
60.0000 mL | Freq: Once | CUTANEOUS | Status: AC
  Administered 2014-10-18: 4 via TOPICAL
  Filled 2014-10-18: qty 60

## 2014-10-18 MED ORDER — GENTAMICIN SULFATE 40 MG/ML IJ SOLN
80.0000 mg | INTRAMUSCULAR | Status: AC
  Administered 2014-10-19: 80 mg
  Filled 2014-10-18: qty 2

## 2014-10-18 MED ORDER — SODIUM CHLORIDE 0.9 % IV SOLN
INTRAVENOUS | Status: DC
  Administered 2014-10-18: 21:00:00 via INTRAVENOUS
  Administered 2014-10-19: 500 mL via INTRAVENOUS
  Administered 2014-10-20 – 2014-10-21 (×2): via INTRAVENOUS

## 2014-10-18 NOTE — Progress Notes (Signed)
Central monitor called. Pt had a 5.5 second pause. Went to check on pt. She is alert and oriented and resting comfortably in bed. She does not complain of any discomfort or pain.  Dr. Radford Pax notified. We are to monitor pt and will reassess with the morning team. Will monitor pt closely.

## 2014-10-18 NOTE — Plan of Care (Signed)
Called and sopke with Anna Elliott (daughter and HPOA) per RN request.  RN paged to inform that daughter wanted patient to be full code in anticipation of pacemaker tomorrow and wanted it changed tonight.  I called Anna Elliott twice on her cell phone without success.  I then called her home number and had a rather lengthy discussion.  She reports that she has talked to multiple family members who have encouraged her to change patient to full code.  We had a full discussion of what DNR meant and that this could be rescinded pre-procedure and reinstated afterwards.  We also discussed what resuscitative measures entail.  She was quite sure that after speaking with her family, she wanted to rescind the DNR and make her full code going forward.

## 2014-10-18 NOTE — Progress Notes (Signed)
CARDIOLOGY ON-CALL NOTIFIED OF FAMILY'S TELEPHONE REQUEST TO HAVE PATIENT CHANGED FROM DNR TO FULL CODE BEFORE PACEMAKER INSERTION TOMORROW.

## 2014-10-18 NOTE — Consult Note (Signed)
ELECTROPHYSIOLOGY CONSULT NOTE    Patient ID: Anna Elliott MRN: [id number], DOB/AGE: 1921-11-06 81 y.o.  Admit date: 10/12/2014 Date of Consult: 10/18/2014  Primary Physician: Sherrie Mustache, MD Primary Cardiologist: Mare Ferrari  Reason for Consultation: atrial fibrillation with pauses  HPI:  Anna Elliott is a 81 y.o. female with a past medical history significant for hypertension, moderate aortic stenosis, persistent atrial fibrillation, chronic diastolic heart failure, and chronic anemia. She was first diagnosed with atrial fibrillation in 2014 and was cardioverted at that time.  She maintained SR until may of 2015 when she had recurrent atrial fibrillation. She was hospitalized at that time and found to be anemic and her anticoagulation was discontinued.  She again had recurrent AF in February of this year which terminated after a couple of weeks.   She was previously on amiodarone but this was discontinued 2/2 concerns about pulmonary toxicity. She was also previously on digoxin, but developed dig toxicity and this was discontinued as well.   She was admitted with symptomatic AF with RVR and was rate controlled but has since developed up to 5.5 second pauses.   Last echo 05/2014 demonstrated EF 60%, no RWMA, moderate AS, LA 51.   She currently denies chest pain, shortness of breath, recent fevers, chills, nausea or vomiting. She has not had pre-syncope or syncope.   Past Medical History  Diagnosis Date  . Hypertension   . Moderate aortic stenosis     a. Dx 2011. b. Echo 05/2012: EF 65%, mild LVH, mod AS mg 45mmHg, mildly dilated LA.  Marland Kitchen Chronic anemia     a. dating back to 2002 - BM bx: early mild dysplasia and/or sideroblastic changes.  Marland Kitchen Dysphagia   . GERD (gastroesophageal reflux disease)   . Esophagitis   . Hiatal hernia   . H/O: rheumatic fever     a. as a child  . Atrial fibrillation     a. Dx 05/2012 unknown duration, started on Eliqiuis.  . Chronic diastolic  heart failure   . Tubular adenoma of colon 11/2008     Surgical History:  Past Surgical History  Procedure Laterality Date  . Tonsillectomy    . Cesarean section  1962  . Abdominal hysterectomy  1967  . Cataract extraction w/ intraocular lens  implant, bilateral Bilateral 1990's  . Esophageal dilation      "twice in her lifetime, I think; most recent was early 2000's" (06/22/2012)  . Tee without cardioversion N/A 07/04/2012    Procedure: TRANSESOPHAGEAL ECHOCARDIOGRAM (TEE);  Surgeon: Fay Records, MD;  Location: [place];  Service: Cardiovascular;  Laterality: N/A;  talk to [facility]  . Cardioversion N/A 07/04/2012    Procedure: CARDIOVERSION;  Surgeon: Fay Records, MD;  Location: [facility] ENDOSCOPY;  Service: Cardiovascular;  Laterality: N/A;     Prescriptions prior to admission  Medication Sig Dispense Refill Last Dose  . ALPRAZolam (XANAX) 0.25 MG tablet Take 0.25 mg by mouth 3 (three) times daily as needed (anxiety).    10/11/2014 at Unknown time  . diltiazem (CARDIZEM CD) 300 MG 24 hr capsule Take 1 capsule (300 mg total) by mouth daily before breakfast. 30 capsule 6 10/12/2014 at 0600  . furosemide (LASIX) 20 MG tablet Take 0.5 tablets (10 mg total) by mouth daily. 30 tablet 2 10/12/2014 at Unknown time  . lansoprazole (PREVACID SOLUTAB) 30 MG disintegrating tablet Take 30 mg by mouth daily.   10/11/2014 at Unknown time  . Multiple Vitamins-Minerals (EYE VITAMINS PO) Take 1 tablet by mouth  daily.   10/10/2014  . zolpidem (AMBIEN) 10 MG tablet Take 5 mg by mouth at bedtime.    10/11/2014 at Unknown time    Inpatient Medications:  . diltiazem  300 mg Oral Q24H  . furosemide  40 mg Oral Daily  . lansoprazole  30 mg Oral Q24H  . metoprolol tartrate  12.5 mg Oral TID  . cyanocobalamin  500 mcg Oral Daily  . zolpidem  5 mg Oral QHS    Allergies:  Allergies  Allergen Reactions  . Amiodarone Shortness Of Breath    Pulmonary distress Possible pulmonary reaction  . Codeine Itching and Nausea  And Vomiting    Makes her sick  . Digoxin And Related Other (See Comments)    Causes "insides to quiver" "very weak"  . Potassium Chloride     Loss of appetite  . Resource Breeze [Alitraq] Other (See Comments)    Patient states Resource breeze "gets her heart racing." She does not want it to be offered to her ever again in the hospital   . Diona Fanti [Aspirin] Other (See Comments) and Rash    Acid reflux  . Ramipril Rash  . Sulfa Antibiotics Rash  . Sulfonamide Derivatives Itching and Rash    Social History   Social History  . Marital Status: Widowed    Spouse Name: N/A  . Number of Children: N/A  . Years of Education: N/A   Occupational History  . Retired    Social History Main Topics  . Smoking status: Never Smoker   . Smokeless tobacco: Never Used  . Alcohol Use: No  . Drug Use: No  . Sexual Activity: No   Other Topics Concern  . Not on file   Social History Narrative   Lives in [place] with her dtr and son-in-law.  She is active around the house and occasionally goes for walks at the mall.  No h/o falls though she notes that her macular degeneration limits her vision somewhat and she is prone to losing her balance on inclines.     Family History  Problem Relation Age of Onset  . Diabetes Father   . Heart attack Father   . Heart disease Brother   . Colon cancer Brother   . Lung cancer Sister   . Lymphoma Sister   . Macular degeneration Mother      Review of Systems: All other systems reviewed and are otherwise negative except as noted above.  Physical Exam: Filed Vitals:   10/17/14 2154 10/18/14 0449 10/18/14 0617 10/18/14 1000  BP: 127/58 128/64 144/85 134/72  Pulse: 73 74 74 100  Temp: 97.8 F (36.6 C) 98.2 F (36.8 C)    TempSrc: Oral Oral    Resp: 20 18 18    Height:      Weight:  115 lb 15.4 oz (52.6 kg)    SpO2: 97% 97% 99%     GEN- The patient is elderly and frail appearing, alert and oriented x 3 today.   HEENT: normocephalic, atraumatic;  sclera clear, conjunctiva pink; hearing intact; oropharynx clear; peri-orbital cachexia Lungs- Clear to ausculation bilaterally, normal work of breathing.  No wheezes, rales, rhonchi Heart- Irregular rate and rhythm, no murmurs, rubs or gallops  GI- soft, non-tender, non-distended, bowel sounds present  Extremities- no clubbing, cyanosis, or edema;  MS- age appropriate atrophy Skin- warm and dry, no rash or lesion Psych- euthymic mood, full affect Neuro- strength and sensation are intact  Labs:   Lab Results  Component Value Date  WBC 6.1 10/13/2014   HGB 9.8* 10/13/2014   HCT 31.5* 10/13/2014   MCV 88.5 10/13/2014   PLT 184 10/13/2014    Recent Labs Lab 10/12/14 1505  10/16/14 0335  NA 141  < > 136  K 3.9  < > 4.0  CL 103  < > 97*  CO2 30  < > 31  BUN 18  < > 21*  CREATININE 1.03*  < > 1.01*  CALCIUM 8.9  < > 8.8*  PROT 6.3*  --   --   BILITOT 2.0*  --   --   ALKPHOS 76  --   --   ALT 53  --   --   AST 52*  --   --   GLUCOSE 111*  < > 107*  < > = values in this interval not displayed.    Radiology/Studies: Dg Chest 2 View 10/15/2014   CLINICAL DATA:  Shortness breath since last Friday.  EXAM: CHEST  2 VIEW  COMPARISON:  Chest x-ray dated 10/12/2014.  FINDINGS: Cardiomediastinal silhouette is stable in size and configuration, mild cardiomegaly. Small bilateral pleural effusions are unchanged. Probable small amount of associated atelectasis at the left lung base. Lungs otherwise clear. No pneumothorax. No acute osseous abnormality.  IMPRESSION: Mild cardiomegaly, stable.  No convincing evidence of acute/active congestive heart failure.  Small bilateral pleural effusions with associated mild atelectasis, stable.  No evidence of pneumonia.   Electronically Signed   By: Franki Cabot M.D.   On: 10/15/2014 13:57   EKG:AF with RVR, v rate 146  TELEMETRY: Rate controlled AF with up to 5.5 second pauses  Assessment/Plan: 1.  Permanent atrial fibrillation/tachy-brady  syndrome The patient has permanent atrial fibrillation with RVR as well as pauses of up to 5.5 seconds.  She has been asymptomatic with her pauses, but is very symptomatic with RVR.   Risks, benefits to pacemaker implantation for tachy/brady syndrome were discussed with the patient and her daughter who wish to proceed. CHADS2VASC is at least 4.  She has a history of anemia and has been off anticoagulation for some time. She refuses Eliquis and carries an allergy to ASA.  We are unable to pursue rhythm control strategies with inability to anticoagulate.   2.  Moderate AS Likely impacts ability to tolerate faster rates of AF  3.  Acute on chronic diastolic heart failure Improved with diuresis  4.  HTN Stable No change required today   Signed, Chanetta Marshall, NP 10/18/2014 11:25 AM  I have seen and examined this patient with Chanetta Marshall.  Agree with above, note added to reflect my findings.  On exam, irregular, tachycardic, 2/6 murmur LUSB, lungs clear.  Has had difficult to control AF over this hospitalization.  Had 5.5 second pauses overnight and cannot increase rate control further without risks of more significant pauses.  Will therefore plan for single chamber pacemaker implant tomorrow.  Risks and benefits of the procedure were explained to her and her daughter.  Will M. Camnitz MD 10/18/2014 12:17 PM

## 2014-10-18 NOTE — Progress Notes (Signed)
     SUBJECTIVE: No dizziness, chest pain or SOB this am.   BP 144/85 mmHg  Pulse 74  Temp(Src) 98.2 F (36.8 C) (Oral)  Resp 18  Ht 5\' 3"  (1.6 m)  Wt 115 lb 15.4 oz (52.6 kg)  BMI 20.55 kg/m2  SpO2 99%  Intake/Output Summary (Last 24 hours) at 10/18/14 0370 Last data filed at 10/17/14 1630  Gross per 24 hour  Intake    480 ml  Output      0 ml  Net    480 ml    PHYSICAL EXAM General: Thin, cachectic female. No acute distress. Alert and oriented x 3.  Psych:  Good affect, responds appropriately Neck: No JVD. No masses noted.  Lungs: Clear bilaterally with no wheezes or rhonci noted.  Heart: Irregular with systolic murmur noted. Abdomen: Bowel sounds are present. Soft, non-tender.  Extremities: No lower extremity edema.   LABS: Basic Metabolic Panel:  Recent Labs  10/16/14 0335  NA 136  K 4.0  CL 97*  CO2 31  GLUCOSE 107*  BUN 21*  CREATININE 1.01*  CALCIUM 8.8*   Current Meds: . diltiazem  300 mg Oral Q24H  . furosemide  40 mg Oral Daily  . lansoprazole  30 mg Oral Q24H  . metoprolol tartrate  12.5 mg Oral TID  . cyanocobalamin  500 mcg Oral Daily  . zolpidem  5 mg Oral QHS    ASSESSMENT AND PLAN: 81 year old with PMH of HTN, chronic anemia, moderate AS, GERD/hiatal hernia, history of rheumatic fever, history of PAF with multiple recurrences, and chronic diastolic heart failure who presented to [facility] with recurrent afib with RR  1. Atrial fib with RVR: Rate better controlled on Cardizem and metoprolol. She is now having pauses overnight. She does not tolerate amiodarone or digoxin (see chart). She refuses apixiban and she is allergic to ASA. CHADSVASC score at least 5 (HTN 1, CHF 1, AGE 81, F SEX1). She has been very difficult to control this admission with tachy/brady. I will ask EP to see today to discuss her medical therapy and also discuss the potential for placement of a pacemaker. Check BMET today.   2. Acute on chronic diastolic CHF: improved with  diuresis and appears euvolemic. Continue Lasix 40 mg daily.   3. HTN: BP controlled  4. Moderate aortic stenosis: clinical follow-up planned     [facility]  9/22/20169:22 AM

## 2014-10-18 NOTE — Progress Notes (Signed)
Pt's HR brady down to 22. It was unsustained. Pt stated she is not experiencing any pain or dizziness and is resting comfortably in bed. Vital signs are within normal limits. HR is currently back up to 75 NSR. Will monitor pt closely.

## 2014-10-18 NOTE — Progress Notes (Signed)
Spoke with pt's daughter, [name], and granddaughter who are requesting pt code status changed to full code.  On call Cardiology MD paged.  MD wished to speak with family first.  Phone number provided for doctor to call family and speak with them.  Will follow up with MD.

## 2014-10-19 ENCOUNTER — Encounter (HOSPITAL_COMMUNITY): Payer: Self-pay | Admitting: Cardiology

## 2014-10-19 ENCOUNTER — Encounter (HOSPITAL_COMMUNITY)
Admission: EM | Disposition: A | Discharge status: Home / Self Care | Payer: Self-pay | Source: Home / Self Care | Attending: Cardiology

## 2014-10-19 ENCOUNTER — Inpatient Hospital Stay (HOSPITAL_COMMUNITY): Payer: Medicare Other

## 2014-10-19 DIAGNOSIS — I495 Sick sinus syndrome: Secondary | ICD-10-CM

## 2014-10-19 DIAGNOSIS — I4891 Unspecified atrial fibrillation: Secondary | ICD-10-CM

## 2014-10-19 HISTORY — PX: EP IMPLANTABLE DEVICE: SHX172B

## 2014-10-19 SURGERY — PACEMAKER IMPLANT
Anesthesia: LOCAL

## 2014-10-19 MED ORDER — METOPROLOL TARTRATE 25 MG PO TABS
25.0000 mg | ORAL_TABLET | Freq: Three times a day (TID) | ORAL | Status: DC
  Administered 2014-10-19 – 2014-10-21 (×6): 25 mg via ORAL
  Filled 2014-10-19 (×8): qty 1

## 2014-10-19 MED ORDER — CEFAZOLIN SODIUM-DEXTROSE 2-3 GM-% IV SOLR
INTRAVENOUS | Status: AC
  Filled 2014-10-19: qty 50

## 2014-10-19 MED ORDER — CHLORHEXIDINE GLUCONATE 4 % EX LIQD
CUTANEOUS | Status: AC
  Administered 2014-10-19: 09:00:00
  Filled 2014-10-19: qty 15

## 2014-10-19 MED ORDER — MIDAZOLAM HCL 5 MG/5ML IJ SOLN
INTRAMUSCULAR | Status: DC | PRN
  Administered 2014-10-19: 1 mg via INTRAVENOUS

## 2014-10-19 MED ORDER — ONDANSETRON HCL 4 MG/2ML IJ SOLN
4.0000 mg | Freq: Four times a day (QID) | INTRAMUSCULAR | Status: DC | PRN
Start: 2014-10-19 — End: 2014-10-24

## 2014-10-19 MED ORDER — ACETAMINOPHEN 325 MG PO TABS
325.0000 mg | ORAL_TABLET | ORAL | Status: DC | PRN
  Administered 2014-10-19 – 2014-10-20 (×3): 650 mg via ORAL
  Filled 2014-10-19 (×3): qty 2

## 2014-10-19 MED ORDER — LIDOCAINE HCL (PF) 1 % IJ SOLN
INTRAMUSCULAR | Status: AC
  Filled 2014-10-19: qty 60

## 2014-10-19 MED ORDER — MIDAZOLAM HCL 5 MG/5ML IJ SOLN
INTRAMUSCULAR | Status: AC
  Filled 2014-10-19: qty 25

## 2014-10-19 MED ORDER — FENTANYL CITRATE (PF) 100 MCG/2ML IJ SOLN
INTRAMUSCULAR | Status: DC | PRN
  Administered 2014-10-19: 12.5 ug via INTRAVENOUS

## 2014-10-19 MED ORDER — LIDOCAINE HCL (PF) 1 % IJ SOLN
INTRAMUSCULAR | Status: DC | PRN
  Administered 2014-10-19: 31 mL

## 2014-10-19 MED ORDER — IOHEXOL 350 MG/ML SOLN
INTRAVENOUS | Status: DC | PRN
  Administered 2014-10-19: 30 mL via INTRAVENOUS

## 2014-10-19 MED ORDER — FENTANYL CITRATE (PF) 100 MCG/2ML IJ SOLN
INTRAMUSCULAR | Status: AC
  Filled 2014-10-19: qty 4

## 2014-10-19 MED ORDER — HEPARIN (PORCINE) IN NACL 2-0.9 UNIT/ML-% IJ SOLN
INTRAMUSCULAR | Status: DC | PRN
  Administered 2014-10-19: 10:00:00

## 2014-10-19 MED ORDER — SODIUM CHLORIDE 0.9 % IR SOLN
Status: AC
  Filled 2014-10-19: qty 2

## 2014-10-19 MED ORDER — CEFAZOLIN SODIUM 1-5 GM-% IV SOLN
1.0000 g | Freq: Four times a day (QID) | INTRAVENOUS | Status: AC
  Administered 2014-10-19 – 2014-10-20 (×3): 1 g via INTRAVENOUS
  Filled 2014-10-19 (×3): qty 50

## 2014-10-19 SURGICAL SUPPLY — 12 items
CABLE SURGICAL S-101-97-12 (CABLE) ×2 IMPLANT
COVER PRB 48X5XTLSCP FOLD TPE (BAG) ×1 IMPLANT
COVER PROBE 5X48 (BAG) ×1
GUIDEWIRE ANGLED .035X150CM (WIRE) ×2 IMPLANT
KIT MICROINTRODUCER STIFF 5F (SHEATH) ×2 IMPLANT
LEAD CAPSURE NOVUS 5076-52CM (Lead) ×2 IMPLANT
PAD DEFIB LIFELINK (PAD) ×2 IMPLANT
PPM SENSIA SR SESR01 (Pacemaker) ×2 IMPLANT
SHEATH CLASSIC 7F (SHEATH) ×2 IMPLANT
TRAY PACEMAKER INSERTION (CUSTOM PROCEDURE TRAY) ×2 IMPLANT
WIRE HI TORQ VERSACORE-J 145CM (WIRE) ×2 IMPLANT
WIRE MICROINTRODUCER 60CM (WIRE) ×4 IMPLANT

## 2014-10-19 NOTE — Progress Notes (Addendum)
Nutrition Follow Up  DOCUMENTATION CODES:   Severe malnutrition in context of chronic illness  INTERVENTION:   No further nutrition intervention at this time --- patient declined  NUTRITION DIAGNOSIS:   Inadequate oral intake related to poor appetite as evidenced by severe depletion of body fat, severe depletion of muscle mass, ongoing  GOAL:   Patient will meet greater than or equal to 90% of their needs, progressing  MONITOR:   PO intake, Supplement acceptance, Weight trends, Labs  ASSESSMENT:  89-y/o female PMHx  HTN, chronic anemia, GERD,  PAF, chronic diastolic HF who presented to [facility] today with recurrent afib with RVR ans SOB.  Patient reports her doing well.  PO intake 50-75% per flowsheet records.  Malnutrition ongoing.  Was receiving Boost Breeze supplements, however, she states it caused her "heart to flutter".  RD offered Ensure Enlive and/or snacks -- pt declined.  Diet Order:  Diet 2 gram sodium Room service appropriate?: Yes; Fluid consistency:: Thin  Skin:  Reviewed, no issues  Last BM:  9/19  Height:  Ht Readings from Last 1 Encounters:  10/12/14 5\' 3"  (1.6 m)   Weight:  Wt Readings from Last 1 Encounters:  10/19/14 116 lb 13.5 oz (53 kg)    Ideal Body Weight:  52.3 kg  BMI:  Body mass index is 20.7 kg/(m^2).  Estimated Nutritional Needs:   Kcal:  1400-1600   Protein:  60-70 gm   Fluid:  per MD  EDUCATION NEEDS:   No education needs identified at this time  Arthur Holms, RD, LDN Pager #: [phone] After-Hours Pager #: [phone]

## 2014-10-19 NOTE — Progress Notes (Signed)
Utilization review completed.  

## 2014-10-19 NOTE — Progress Notes (Signed)
     SUBJECTIVE: No dizziness, chest pain or SOB this am.  4 second pause last night but feeling well without complaint. Plan for pacemaker placement today with single lead device.  BP 138/77 mmHg  Pulse 86  Temp(Src) 97.9 F (36.6 C) (Oral)  Resp 18  Ht 5\' 3"  (1.6 m)  Wt 116 lb 13.5 oz (53 kg)  BMI 20.70 kg/m2  SpO2 96%  Intake/Output Summary (Last 24 hours) at 10/19/14 0915 Last data filed at 10/18/14 1800  Gross per 24 hour  Intake    480 ml  Output      1 ml  Net    479 ml    PHYSICAL EXAM General: Thin, cachectic female. No acute distress. Alert and oriented x 3.  Psych:  Good affect, responds appropriately Neck: No JVD. No masses noted.  Lungs: Clear bilaterally with no wheezes or rhonci noted.  Heart: Irregular with systolic murmur noted.  Tachycardic Abdomen: Bowel sounds are present. Soft, non-tender.  Extremities: No lower extremity edema.   LABS: Basic Metabolic Panel: No results for input(s): NA, K, CL, CO2, GLUCOSE, BUN, CREATININE, CALCIUM, MG, PHOS in the last 72 hours. Current Meds: .  ceFAZolin (ANCEF) IV  2 g Intravenous To SS-Surg  . chlorhexidine  60 mL Topical Once  . diltiazem  300 mg Oral Q24H  . furosemide  40 mg Oral Daily  . gentamicin irrigation  80 mg Irrigation To SS-Surg  . lansoprazole  30 mg Oral Q24H  . metoprolol tartrate  12.5 mg Oral TID  . cyanocobalamin  500 mcg Oral Daily  . zolpidem  5 mg Oral QHS  Telemetry: atrial fibrillation, 4 second pause overnight  ASSESSMENT AND PLAN: 81 year old with PMH of HTN, chronic anemia, moderate AS, GERD/hiatal hernia, history of rheumatic fever, history of PAF with multiple recurrences, and chronic diastolic heart failure who presented to [facility] with recurrent afib with RR  1. Atrial fib with RVR: Improved rate control with adjustment of medications.  Continuing to have pauses up to 4 seconds last night.  Agreed to single chamber pacemaker.  Have discussed the procedure with the patient and the  daughter and they have agreed to proceed with pacemaker placement.  Understood risks and benefits.  Patient's DNR status was changed overnight to full code for the procedure.  2. Acute on chronic diastolic CHF: improved with diuresis and appears euvolemic. Continue Lasix 40 mg daily.   3. HTN: BP controlled  4. Moderate aortic stenosis: clinical follow-up planned     Will Sarah D [facility]  9/23/20169:15 AM

## 2014-10-19 NOTE — Care Management Important Message (Signed)
Important Message  Patient Details  Name: Anna Elliott MRN: [id number] Date of Birth: 1921/03/14   Medicare Important Message Given:  Yes-second notification given    Loann Quill 10/19/2014, 10:42 AM

## 2014-10-19 NOTE — Progress Notes (Signed)
HR in 140s. Pt is asymptomatic. MD made aware. RN received orders for chest x-ray and STAT EKG.

## 2014-10-19 NOTE — Progress Notes (Signed)
Cardiology on-call notified of patient's telemetry pauses of approximately 4 seconds.

## 2014-10-20 ENCOUNTER — Inpatient Hospital Stay (HOSPITAL_COMMUNITY): Payer: Medicare Other

## 2014-10-20 DIAGNOSIS — R071 Chest pain on breathing: Secondary | ICD-10-CM

## 2014-10-20 MED ORDER — KETOROLAC TROMETHAMINE 30 MG/ML IJ SOLN
30.0000 mg | Freq: Once | INTRAMUSCULAR | Status: AC
  Administered 2014-10-20: 30 mg via INTRAVENOUS
  Filled 2014-10-20: qty 1

## 2014-10-20 MED ORDER — KETOROLAC TROMETHAMINE 15 MG/ML IJ SOLN
15.0000 mg | Freq: Four times a day (QID) | INTRAMUSCULAR | Status: DC | PRN
  Administered 2014-10-20 – 2014-10-21 (×2): 15 mg via INTRAVENOUS
  Filled 2014-10-20 (×3): qty 1

## 2014-10-20 MED ORDER — TRAMADOL HCL 50 MG PO TABS
50.0000 mg | ORAL_TABLET | Freq: Four times a day (QID) | ORAL | Status: DC | PRN
Start: 2014-10-20 — End: 2014-10-21
  Administered 2014-10-20 (×2): 50 mg via ORAL
  Filled 2014-10-20 (×2): qty 1

## 2014-10-20 MED ORDER — MORPHINE SULFATE (PF) 2 MG/ML IV SOLN
2.0000 mg | Freq: Once | INTRAVENOUS | Status: AC
  Administered 2014-10-20: 2 mg via INTRAVENOUS
  Filled 2014-10-20: qty 1

## 2014-10-20 MED ORDER — MORPHINE SULFATE (PF) 2 MG/ML IV SOLN
2.0000 mg | INTRAVENOUS | Status: DC | PRN
  Administered 2014-10-20 (×2): 2 mg via INTRAVENOUS
  Filled 2014-10-20 (×3): qty 1

## 2014-10-20 NOTE — Progress Notes (Signed)
Patient ID: Anna Elliott, female   DOB: 1921/11/14, 81 y.o.   MRN: [id number]  EP on call  Called to check on patient. Her pleuritic chest pain is much improved. Her VR is 90/min.  Quick look echo demonstrates no significant pericardial effusion. CXR demonstrates no PTX.  Will observe tonight. If her pain is controlled and no other issues will dc tomorrow. If she has more chest pain, will revise her lead.  Mikle Bosworth.D.

## 2014-10-20 NOTE — Progress Notes (Signed)
Pt states she is starting to have pain on her left chest/flank area. She states it is hard to take a deep breath. Dr. Wynonia Lawman  paged concerning administration of morphine due to a low bp of 95/55. Will hold for now and administer PO tramadol for 10/10 pain. RN will also hold metoprolol 25mg  for low bp until MD is reached.   Will continue to monitor.   Earlie Lou

## 2014-10-20 NOTE — Progress Notes (Signed)
MD paged x 2.

## 2014-10-20 NOTE — Progress Notes (Signed)
Paged Dr. Debara Pickett about holding scheduled metoprolol due to a BP of 95/52 and HR of 74. Medication held per request of MD. Will continue to monitor.

## 2014-10-20 NOTE — Progress Notes (Signed)
Patient ID: Anna Elliott, female   DOB: 1921-03-17, 81 y.o.   MRN: [id number]    Patient Name: Anna Elliott Date of Encounter: 10/20/2014     Active Problems:   Essential hypertension   Moderate aortic stenosis   Acute diastolic heart failure   Atrial fibrillation with rapid ventricular response   CKD (chronic kidney disease), stage III   PAF (paroxysmal atrial fibrillation)   Protein-calorie malnutrition, severe    SUBJECTIVE  C/o chest pain under breast radiating to back/shoulder blade, worse with inspiration.  CURRENT MEDS . chlorhexidine  60 mL Topical Once  . diltiazem  300 mg Oral Q24H  . furosemide  40 mg Oral Daily  . ketorolac  30 mg Intravenous Once  . lansoprazole  30 mg Oral Q24H  . metoprolol tartrate  25 mg Oral TID  . cyanocobalamin  500 mcg Oral Daily  . zolpidem  5 mg Oral QHS    OBJECTIVE  Filed Vitals:   10/19/14 1847 10/19/14 2103 10/20/14 0327 10/20/14 0755  BP: 115/80 118/80 134/88 98/66  Pulse: 123 102 69 147  Temp:  98.2 F (36.8 C) 97.8 F (36.6 C)   TempSrc:  Oral Oral   Resp:  18 18   Height:      Weight:   117 lb 1 oz (53.1 kg)   SpO2: 95% 96% 96% 94%    Intake/Output Summary (Last 24 hours) at 10/20/14 1132 Last data filed at 10/20/14 0830  Gross per 24 hour  Intake    360 ml  Output   1350 ml  Net   -990 ml   Filed Weights   10/18/14 0449 10/19/14 0553 10/20/14 0327  Weight: 115 lb 15.4 oz (52.6 kg) 116 lb 13.5 oz (53 kg) 117 lb 1 oz (53.1 kg)    PHYSICAL EXAM  General: Pleasant, NAD. Neuro: Alert and oriented X 3. Moves all extremities spontaneously. Psych: Normal affect. HEENT:  Normal  Neck: Supple without bruits or JVD. Lungs:  Resp regular and unlabored, CTA. Heart: RRR no s3, s4, or murmurs. Abdomen: Soft, non-tender, non-distended, BS + x 4.  Extremities: No clubbing, cyanosis or edema. DP/PT/Radials 2+ and equal bilaterally.  Accessory Clinical Findings  CBC No results for input(s): WBC, NEUTROABS, HGB,  HCT, MCV, PLT in the last 72 hours. Basic Metabolic Panel No results for input(s): NA, K, CL, CO2, GLUCOSE, BUN, CREATININE, CALCIUM, MG, PHOS in the last 72 hours. Liver Function Tests No results for input(s): AST, ALT, ALKPHOS, BILITOT, PROT, ALBUMIN in the last 72 hours. No results for input(s): LIPASE, AMYLASE in the last 72 hours. Cardiac Enzymes No results for input(s): CKTOTAL, CKMB, CKMBINDEX, TROPONINI in the last 72 hours. BNP Invalid input(s): POCBNP D-Dimer No results for input(s): DDIMER in the last 72 hours. Hemoglobin A1C No results for input(s): HGBA1C in the last 72 hours. Fasting Lipid Panel No results for input(s): CHOL, HDL, LDLCALC, TRIG, CHOLHDL, LDLDIRECT in the last 72 hours. Thyroid Function Tests No results for input(s): TSH, T4TOTAL, T3FREE, THYROIDAB in the last 72 hours.  Invalid input(s): FREET3  TELE  Atrial fib with an RVR/CVR  Radiology/Studies  Dg Chest 2 View  10/20/2014   CLINICAL DATA:  Status post pacemaker insertion.  EXAM: CHEST  2 VIEW  COMPARISON:  10/19/2014  FINDINGS: Left subclavian approach single lead pacemaker remains in place with tip terminating over the right ventricle. The cardiac silhouette remains mildly enlarged. Small bilateral pleural effusions, left larger than right, have not significantly changed in size.  Scarring is again seen in the lung apices. There is minimal bibasilar opacity which likely reflects atelectasis. No edema or pneumothorax is identified. Aortic calcification is noted. No acute osseous abnormality is seen.  IMPRESSION: 1. Small bilateral pleural effusions, unchanged. 2. Mild bibasilar atelectasis. 3. Cardiomegaly.   Electronically Signed   By: Logan Bores M.D.   On: 10/20/2014 08:05   Dg Chest 2 View  10/19/2014   CLINICAL DATA:  Status post pacemaker placement today.  Tachycardia.  EXAM: CHEST  2 VIEW  COMPARISON:  Bone PA and lateral chest 10/15/2014.  FINDINGS: The patient has a new single lead pacing  device from a left subclavian approach. The tip of the lead is in the apex of the right ventricle. There is cardiomegaly without edema. Small bilateral pleural effusions have decreased since the prior examination. There is no pulmonary edema with the lungs clear. No pneumothorax. Atherosclerotic vascular disease of the aorta is noted.  IMPRESSION: Pacemaker in place without evidence of complication.  Decreased small bilateral pleural effusions.  Cardiomegaly without edema.  Atherosclerosis.   Electronically Signed   By: Inge Rise M.D.   On: 10/19/2014 18:11   Dg Chest 2 View  10/15/2014   CLINICAL DATA:  Shortness breath since last Friday.  EXAM: CHEST  2 VIEW  COMPARISON:  Chest x-ray dated 10/12/2014.  FINDINGS: Cardiomediastinal silhouette is stable in size and configuration, mild cardiomegaly. Small bilateral pleural effusions are unchanged. Probable small amount of associated atelectasis at the left lung base. Lungs otherwise clear. No pneumothorax. No acute osseous abnormality.  IMPRESSION: Mild cardiomegaly, stable.  No convincing evidence of acute/active congestive heart failure.  Small bilateral pleural effusions with associated mild atelectasis, stable.  No evidence of pneumonia.   Electronically Signed   By: Franki Cabot M.D.   On: 10/15/2014 13:57   Dg Chest 2 View  10/12/2014   CLINICAL DATA:  Shortness of breath for several days, atrial fibrillation  EXAM: CHEST  2 VIEW  COMPARISON:  06/21/2014  FINDINGS: Cardiomegaly evident with minor central vascular congestion. Slightly lower lung volumes with small pleural effusions, slightly larger on the left. Minor associated basilar atelectasis. No definite focal pneumonia. Negative for pneumothorax. Aortic atherosclerosis noted. Trachea is midline.  IMPRESSION: cardiomegaly with vascular congestion  Lower lung volumes with basilar atelectasis  Trace pleural effusions, slightly larger on the left   Electronically Signed   By: Jerilynn Mages.  Shick M.D.   On:  10/12/2014 14:25    ASSESSMENT AND PLAN  1. Chest pain after PPM - her lead is working normally. Will check 2D echo. She will likely need lead revision. Will give toradol for pain. Will check echo. If she has an effusion, will proceed with Lead revision today, if none, will see if pain can be controlled with NSAIDS/narcotics. Will plan to repeat cxr to be sure no PTX although initial CXR did not show. 2. Atrial fib with a RVR - her rate is better than yesterday but still not controlled. I suspect pain is the cause. Continue AV nodal blocking drugs for now.  3. PPM - her device is working normally. Will recheck.  Gregg Taylor,M.D.  10/20/2014 11:32 AM

## 2014-10-20 NOTE — Progress Notes (Signed)
Patient complaining of pain under arm "when I take a deep breath".  States was not in pain until she awoke this am.  Will monitor.  MD to be notified.  2 mg. Of Morphine not effective.

## 2014-10-20 NOTE — Progress Notes (Addendum)
O2 applied at  2 Liters due to increased pain with inspiration.

## 2014-10-20 NOTE — Progress Notes (Signed)
ON-CALL NOTIFIED OF PATIENT'S C/O OF SIGNIFICANTLY INTENSIFIED PAIN AT PACEMAKER SITE. SOME SWELLING PRESENT. DRESSING CLEAN, DRY, AND INTACT.  MORPHINE 2MG  IV ONCE ORDERED.

## 2014-10-21 DIAGNOSIS — I959 Hypotension, unspecified: Secondary | ICD-10-CM

## 2014-10-21 MED ORDER — SODIUM CHLORIDE 0.9 % IV SOLN
INTRAVENOUS | Status: DC
  Administered 2014-10-21: 125 mL/h via INTRAVENOUS
  Administered 2014-10-21 – 2014-10-22 (×2): via INTRAVENOUS

## 2014-10-21 NOTE — Discharge Instructions (Signed)
° ° °  Supplemental Discharge Instructions for  Pacemaker/Defibrillator Patients  Activity No heavy lifting or vigorous activity with your left/right arm for 6 to 8 weeks.  Do not raise your left/right arm above your head for one week.  Gradually raise your affected arm as drawn below.           __     10/24/14                    10/25/14                     10/26/14               10/27/14    WOUND CARE - Keep the wound area clean and dry.  Do not get this area wet for one week. No showers for one week; you may shower on  10/27/14   . - The tape/steri-strips on your wound will fall off; do not pull them off.  No bandage is needed on the site.  DO  NOT apply any creams, oils, or ointments to the wound area. - If you notice any drainage or discharge from the wound, any swelling or bruising at the site, or you develop a fever > 101? F after you are discharged home, call the office at once.  Special Instructions - You are still able to use cellular telephones; use the ear opposite the side where you have your pacemaker/defibrillator.  Avoid carrying your cellular phone near your device. - When traveling through airports, show security personnel your identification card to avoid being screened in the metal detectors.  Ask the security personnel to use the hand wand. - Avoid arc welding equipment, MRI testing (magnetic resonance imaging), TENS units (transcutaneous nerve stimulators).  Call the office for questions about other devices. - Avoid electrical appliances that are in poor condition or are not properly grounded. - Microwave ovens are safe to be near or to operate.

## 2014-10-21 NOTE — Progress Notes (Signed)
Pt's daughter [name]) called questioning her mother's pain status and why her mother had not received her 12am dose of PRN Toradol.  RN explained that pt has been sleeping and received pain medication at 2210.  RN also educated daughter that pt needs to request pain medication as pt is A&Ox4.  Pt is currently sleeping.  No apparent distress.

## 2014-10-21 NOTE — Progress Notes (Signed)
Patient's pulse = 120's - 160 during ambulation

## 2014-10-21 NOTE — Progress Notes (Signed)
Pt's daughter [name]) called questioning her mother's pain status again.  RN explained to pt's daughter that pt is still sleeping and has not complained of any pain.  RN reeducated that pt is A&OX4 and will not receive any pain medication unless the pt verbally states she is in pain.  Pt is sleeping and showing no signs of distress.  Will continue to monitor.

## 2014-10-21 NOTE — Progress Notes (Signed)
Patient ID: Anna Elliott, female   DOB: Oct 22, 1921, 81 y.o.   MRN: [id number]    Patient Name: Anna Elliott Date of Encounter: 10/21/2014     Active Problems:   Essential hypertension   Moderate aortic stenosis   Acute diastolic heart failure   Atrial fibrillation with rapid ventricular response   CKD (chronic kidney disease), stage III   PAF (paroxysmal atrial fibrillation)   Protein-calorie malnutrition, severe    SUBJECTIVE  No chest pain or sob. Blood pressure remains low.   CURRENT MEDS . chlorhexidine  60 mL Topical Once  . diltiazem  300 mg Oral Q24H  . furosemide  40 mg Oral Daily  . lansoprazole  30 mg Oral Q24H  . metoprolol tartrate  25 mg Oral TID  . cyanocobalamin  500 mcg Oral Daily  . zolpidem  5 mg Oral QHS    OBJECTIVE  Filed Vitals:   10/20/14 1317 10/20/14 2157 10/21/14 0516 10/21/14 0852  BP: 97/49 95/52 94/62  90/47  Pulse: 97 89 79   Temp: 97.7 F (36.5 C) 98.1 F (36.7 C) 98.1 F (36.7 C)   TempSrc: Oral Oral Oral   Resp: 20 18 12    Height:      Weight:   120 lb 12.8 oz (54.795 kg)   SpO2: 97% 94% 99%     Intake/Output Summary (Last 24 hours) at 10/21/14 1039 Last data filed at 10/21/14 0847  Gross per 24 hour  Intake   1320 ml  Output    750 ml  Net    570 ml   Filed Weights   10/19/14 0553 10/20/14 0327 10/21/14 0516  Weight: 116 lb 13.5 oz (53 kg) 117 lb 1 oz (53.1 kg) 120 lb 12.8 oz (54.795 kg)    PHYSICAL EXAM  General: Pleasant, elderly woman, NAD. Neuro: Alert and oriented X 3. Moves all extremities spontaneously. Psych: Normal affect. HEENT:  Normal  Neck: Supple without bruits or JVD. Lungs:  Resp regular and unlabored, CTA. Heart: IRRR no s3, s4, or murmurs. Abdomen: Soft, non-tender, non-distended, BS + x 4.  Extremities: No clubbing, cyanosis or edema. DP/PT/Radials 2+ and equal bilaterally.  Accessory Clinical Findings  CBC No results for input(s): WBC, NEUTROABS, HGB, HCT, MCV, PLT in the last 72  hours. Basic Metabolic Panel No results for input(s): NA, K, CL, CO2, GLUCOSE, BUN, CREATININE, CALCIUM, MG, PHOS in the last 72 hours. Liver Function Tests No results for input(s): AST, ALT, ALKPHOS, BILITOT, PROT, ALBUMIN in the last 72 hours. No results for input(s): LIPASE, AMYLASE in the last 72 hours. Cardiac Enzymes No results for input(s): CKTOTAL, CKMB, CKMBINDEX, TROPONINI in the last 72 hours. BNP Invalid input(s): POCBNP D-Dimer No results for input(s): DDIMER in the last 72 hours. Hemoglobin A1C No results for input(s): HGBA1C in the last 72 hours. Fasting Lipid Panel No results for input(s): CHOL, HDL, LDLCALC, TRIG, CHOLHDL, LDLDIRECT in the last 72 hours. Thyroid Function Tests No results for input(s): TSH, T4TOTAL, T3FREE, THYROIDAB in the last 72 hours.  Invalid input(s): FREET3  TELE  Atrial fib with an RVR  Radiology/Studies  Dg Chest 2 View  10/20/2014   CLINICAL DATA:  Status post pacemaker insertion.  EXAM: CHEST  2 VIEW  COMPARISON:  10/19/2014  FINDINGS: Left subclavian approach single lead pacemaker remains in place with tip terminating over the right ventricle. The cardiac silhouette remains mildly enlarged. Small bilateral pleural effusions, left larger than right, have not significantly changed in size. Scarring is again seen  in the lung apices. There is minimal bibasilar opacity which likely reflects atelectasis. No edema or pneumothorax is identified. Aortic calcification is noted. No acute osseous abnormality is seen.  IMPRESSION: 1. Small bilateral pleural effusions, unchanged. 2. Mild bibasilar atelectasis. 3. Cardiomegaly.   Electronically Signed   By: Logan Bores M.D.   On: 10/20/2014 08:05   Dg Chest 2 View  10/19/2014   CLINICAL DATA:  Status post pacemaker placement today.  Tachycardia.  EXAM: CHEST  2 VIEW  COMPARISON:  Bone PA and lateral chest 10/15/2014.  FINDINGS: The patient has a new single lead pacing device from a left subclavian  approach. The tip of the lead is in the apex of the right ventricle. There is cardiomegaly without edema. Small bilateral pleural effusions have decreased since the prior examination. There is no pulmonary edema with the lungs clear. No pneumothorax. Atherosclerotic vascular disease of the aorta is noted.  IMPRESSION: Pacemaker in place without evidence of complication.  Decreased small bilateral pleural effusions.  Cardiomegaly without edema.  Atherosclerosis.   Electronically Signed   By: Inge Rise M.D.   On: 10/19/2014 18:11   Dg Chest 2 View  10/15/2014   CLINICAL DATA:  Shortness breath since last Friday.  EXAM: CHEST  2 VIEW  COMPARISON:  Chest x-ray dated 10/12/2014.  FINDINGS: Cardiomediastinal silhouette is stable in size and configuration, mild cardiomegaly. Small bilateral pleural effusions are unchanged. Probable small amount of associated atelectasis at the left lung base. Lungs otherwise clear. No pneumothorax. No acute osseous abnormality.  IMPRESSION: Mild cardiomegaly, stable.  No convincing evidence of acute/active congestive heart failure.  Small bilateral pleural effusions with associated mild atelectasis, stable.  No evidence of pneumonia.   Electronically Signed   By: Franki Cabot M.D.   On: 10/15/2014 13:57   Dg Chest 2 View  10/12/2014   CLINICAL DATA:  Shortness of breath for several days, atrial fibrillation  EXAM: CHEST  2 VIEW  COMPARISON:  06/21/2014  FINDINGS: Cardiomegaly evident with minor central vascular congestion. Slightly lower lung volumes with small pleural effusions, slightly larger on the left. Minor associated basilar atelectasis. No definite focal pneumonia. Negative for pneumothorax. Aortic atherosclerosis noted. Trachea is midline.  IMPRESSION: cardiomegaly with vascular congestion  Lower lung volumes with basilar atelectasis  Trace pleural effusions, slightly larger on the left   Electronically Signed   By: Jerilynn Mages.  Shick M.D.   On: 10/12/2014 14:25   Dg Chest  Port 1 View  10/20/2014   CLINICAL DATA:  Patient status post pacemaker placement. Pain under the left shoulder blade.  EXAM: PORTABLE CHEST 1 VIEW  COMPARISON:  Chest radiograph 10/20/2014  FINDINGS: Single lead pacer apparatus overlies the left hemi thorax, leads are stable in position. Stable cardiac and mediastinal contours. Persistent small left pleural effusion with underlying pulmonary consolidation. Right lung is clear. No definite pneumothorax.  IMPRESSION: Stable positioning left anterior chest wall pacer apparatus.  Small left pleural effusion with underlying pulmonary consolidation favored to represent atelectasis.   Electronically Signed   By: Lovey Newcomer M.D.   On: 10/20/2014 12:33    ASSESSMENT AND PLAN  1. Atrial fib with tachy-brady - her rates are not well controlled. Will follow. I would like to give her digoxin. She has an allergy.  2. Hypotension - will give a fluid bolus 3. Pleuritic chest pain - this has resolved. We will hold her medications, watch one additional day and plan to discharge home tomorrow if stable and no pain.  Gregg Taylor,M.D.  10/21/2014 10:39 AM

## 2014-10-22 ENCOUNTER — Inpatient Hospital Stay (HOSPITAL_COMMUNITY): Payer: Medicare Other

## 2014-10-22 DIAGNOSIS — R079 Chest pain, unspecified: Secondary | ICD-10-CM

## 2014-10-22 LAB — GLUCOSE, CAPILLARY: Glucose-Capillary: 122 mg/dL — ABNORMAL HIGH (ref 65–99)

## 2014-10-22 MED ORDER — FUROSEMIDE 10 MG/ML IJ SOLN
20.0000 mg | Freq: Once | INTRAMUSCULAR | Status: AC
  Administered 2014-10-22: 20 mg via INTRAVENOUS
  Filled 2014-10-22: qty 2

## 2014-10-22 MED ORDER — METOPROLOL TARTRATE 25 MG PO TABS
37.5000 mg | ORAL_TABLET | Freq: Two times a day (BID) | ORAL | Status: DC
  Administered 2014-10-22 – 2014-10-24 (×5): 37.5 mg via ORAL
  Filled 2014-10-22 (×10): qty 1

## 2014-10-22 NOTE — Progress Notes (Signed)
Patient ambulated to bathroom, new bag of fluids hung.  No distress call bell within reach

## 2014-10-22 NOTE — Progress Notes (Signed)
  Echocardiogram 2D Echocardiogram has been performed.  Darlina Sicilian M 10/22/2014, 11:30 AM

## 2014-10-22 NOTE — Progress Notes (Signed)
Patient spilled water in her bed, linens changed. No distress or pain, call light within reach.

## 2014-10-22 NOTE — Discharge Summary (Signed)
Physician Discharge Summary    Cardiologist: Dr. Curt Bears Patient ID: Anna Elliott MRN: [id number] DOB/AGE: 1921-06-03 81 y.o.  Admit date: 10/12/2014 Discharge date: 10/24/2014  Admission Diagnoses:  1. AFib with RVR  CHADS2Vasc is at least 5  previoiusly on anticoagulation but stopped after concerns with significant anemia and then the patient and daughter declined. 2. Acute on chronic CHF, diastolic     Discharge Diagnoses:  Active Problems:  1. Essential hypertension  s/p hypotension, treated with fluids, BP appears stable today   2.  Moderate aortic stenosis  3.  Acute diastolic heart failure  euvolemic by exam today   4. Atrial fibrillation with rapid ventricular response, tachy-brady syndrome  s/p PPM, Medtronic, better rate control   3. CKD (chronic kidney disease), stage III   Appears stable   4. Protein-calorie malnutrition, severe  Seen by Nutrion services, to c/w her PMD out patient   Discharged Condition: stable  Hospital Course: The patient is a 81 year old with PMH of HTN, chronic anemia, moderate AS, GERD/hiatal hernia, history of rheumatic fever, history of PAF with multiple recurrences, and chronic diastolic heart failure who presented to [facility] with recurrent afib with RVR.  Patient has been followed by Dr. Mare Ferrari. She was initially diagnosed with atrial fibrillation back in 2014 and underwent TEE cardioversion and placed on eliquis. She had recurrence of atrial fibrillation in May 2015. Unfortunately she also had drop in hemoglobin concerning for iron deficiency anemia. Her eliquis was discontinued. She is allergic to aspirin. She has not been on any anticoagulation therapy understanding higher chance of stroke. She was at one point on amiodarone, due to the daughter's concern of pulmonary toxicity associated with amiodarone, this was discontinued. Unfortunately, with lack of an arrhythmic therapy in this elderly patient, she is prone to have recurrence of atrial  fibrillation. She had a recurrence of atrial fibrillation was in February 2016. She spontaneously converted back into sinus rhythm after a few weeks.   The patient was recently hospitalized 06/21/14 until 06/29/14 for atrial fibrillation with rapid ventricular response and d/c on Cardizem 300 mg daily and digoxin 0.125mg  daily. 2D ECHO showed EF 60%, no regional wall motion abnormality, moderate AS, moderate MR, mildly to moderately reduced RVEF, and severely dilated left atrium.  Unfortunately over the course of the summer she developed digitalis toxicity. She had GI symptoms which were recognized by the home health nurse. A digoxin level was drawn but it was drawn 6 days after the patient had stopped taking digoxin as of the digoxin level by that time was normal.  Outpt, she saw Dr. Mare Ferrari in the office on 10/05/14. She was in afib with CVR HR 80 bpm. She was felt to be doing well from a cardiac standpoint. Eventually though began feeling more short of breath with orthopnea, PND and chronic LE edema   She was admitted to telemetry and treated for CHF with IV then PO lasix she developed some hypotension and required hydration therapy though remained overall fluid negative with stable BP on PO lasix.  She was treated  For her AFib with RVR, with Cardizem gtt with som mention of skin irritation the daughter reported secondary to IV cardizem but none with PO and she was switched to PO, the patient's daughter was very involved during her hospital course and informed us that the pt was intolerant of amiodarone with some concerns about lung toxicity she as well reported digoxin with toxicity in the past, so this were not used.  She was then  observed to have pauses as long as 5.5 seconds and her rate controlling med lowered and referred for PPM which she had done on 10/19/14 with a MDT device without complications and maintained on PO cardizem and Metoprolol for rate control, her HR today in the 80's-90 range.  She  developed some post implant pleuritic chest discomfort, Dr. Lovena Le did quick look echo that did not demonstrate any significant pericardial effusion, a follow up echo was negative for pericardial effusion, but showed large left pleural effusion.   Her CXR 10/22/14 was stable and with no sign of PPM complication or pneumothorax.  The  Patient's daughter felt she was struggling to breathe however her sats were very good on RA and with ambulation, but given her echo/CXR she was re-treated with IV lasix.  She was fluid negative overall 6888mlml.  Her vitals have remained stable.  The patient's daughter requested that they have a palliative care consult for goals of care and after their meeting she was made DNR/DNI and planed to discharge her to home on Hospice with her meds as well as Xanax and Morphine as recommended by Palliative care MD.  Her vitals have remained stable, the patient was seen by Dr. Curt Bears and felt to be stable for discharge.  The patient was seen by nutritional services during her stay here and to continue with her PMD out pt for ongoing dietary recommendations  Consults: EP service, Nutrition service, Palliative Care.  Significant Diagnostic Studies:  Echo 10/22/14 Impressions:  - Compared to the the prior study from 06/22/14 the LVEF is unchanged. Aortic stenosis remains moderate. Pulmonary hypertension is now severe, previously mild. There is severe left pleural effusion.  Echo 06/22/14 - Left ventricle: There is upper septal thickening, but no LVOT obstruction. The cavity size was normal. Wall thickness was increased in a pattern of mild LVH. The estimated ejection fraction was 60%. Wall motion was normal; there were no regional wall motion abnormalities. - Aortic valve: Significant thickening and calcification of the leaflets. However, AS is only moderate. There was trivial regurgitation. - Mitral valve: Moderately calcified, moderately thickened  annulus. There was moderate regurgitation. - Left atrium: The atrium was severely dilated. - Right ventricle: Systolic function was mildly to moderately reduced.  PPM implant 10/19/14 -Medtronic Amo model [id number], Medtronic model [id number] (serial number [id number]) right ventricular lead   CXR 10/22/14 IMPRESSION: Stable left pleural effusion with left lower lobe opacity, likely atelectasis. Minimal right base atelectasis. CXR 10/20/14: CLINICAL DATA: Patient status post pacemaker placement. Pain under the left shoulder blade.    Treatments: See above  Discharge Exam:   Physical Exam: Filed Vitals:   10/23/14 0937 10/23/14 1406 10/23/14 2017 10/24/14 0511  BP:  121/62 131/60 119/82  Pulse: 120 70 74 108  Temp:  98 F (36.7 C) 98.5 F (36.9 C) 97.3 F (36.3 C)  TempSrc:  Oral Oral Oral  Resp:  16 16 17   Height:      Weight:    123 lb 3.2 oz (55.883 kg)  SpO2:  99% 100% 95%    GEN- The patient is very thin, frail appearing,  alert and oriented x 3 today.   Head- normocephalic, atraumatic Eyes-  Sclera clear, conjunctiva pink Ears- hearing intact Lungs- diminished at the left base, normal work of breathing Heart- irregular-irregular, 1-2/6 SM,  GI- soft, NT, ND, + BS Extremities- no clubbing, cyanosis, or edema MS- age appropriate atrophy Skin- no rash or lesion Neuro- grossly intact   Disposition: Home with  Hospice care      Discharge Instructions    Diet - low sodium heart healthy    Complete by:  As directed      Increase activity slowly    Complete by:  As directed             Medication List    TAKE these medications        ALPRAZolam 0.5 MG tablet  Commonly known as:  XANAX  Take 1 tablet (0.5 mg total) by mouth 3 (three) times daily as needed (anxiety).     diltiazem 300 MG 24 hr capsule  Commonly known as:  CARDIZEM CD  Take 1 capsule (300 mg total) by mouth daily before breakfast.     EYE VITAMINS PO  Take 1 tablet by mouth  daily.     furosemide 20 MG tablet  Commonly known as:  LASIX  Take 1 tablet (20 mg total) by mouth daily.     lansoprazole 30 MG disintegrating tablet  Commonly known as:  PREVACID SOLUTAB  Take 30 mg by mouth daily.     Metoprolol Tartrate 37.5 MG Tabs  Take 37.5 mg by mouth 2 (two) times daily.     morphine CONCENTRATE 10 MG/0.5ML Soln concentrated solution  Take 0.25 mLs (5 mg total) by mouth every 2 (two) hours as needed for moderate pain, severe pain, anxiety or shortness of breath.     zolpidem 10 MG tablet  Commonly known as:  AMBIEN  Take 5 mg by mouth at bedtime.       Follow-up Information    Follow up with [facility] On 10/31/2014.   Why:  at 3:30PM for wound check   Contact information:   [address] [place] [address] [phone]      Follow up with Will Meredith Leeds, MD On 01/22/2015.   Specialty:  Cardiology   Why:  at [facility] information:   [place] [address] [phone]       Follow up with [facility].   Why:  Home Hospice arranged   Contact information:   [address] [place] [address] [phone]       Signed: Baldwin Jamaica 10/24/2014, 9:00 AM  I have seen and examined this patient with Tommye Standard.  Agree with above, note added to reflect my findings.  On exam, irregular and tachycardic, lungs clear.  Plan for discharge home today with hospice.  Patient feeling improved with breathing but seems more subdued.  Likely will be helped by being back in her home environment.  Will plan for device check and follow up with me in 3 months.    Will M. Camnitz MD 10/24/2014 10:02 AM

## 2014-10-22 NOTE — Care Management Important Message (Signed)
Important Message  Patient Details  Name: Anna Elliott MRN: [id number] Date of Birth: 23-Mar-1921   Medicare Important Message Given:  Yes-third notification given    Delorse Lek 10/22/2014, 2:13 PM

## 2014-10-22 NOTE — Progress Notes (Signed)
Patient asleep, call light within reach

## 2014-10-22 NOTE — Progress Notes (Signed)
Patient ambulated on room air ~150 feet.  Oxygen saturation prior to ambulating: 96% HR 75, during ambulation: 93-94% HR 87, and after sitting and recovering 1 minute: 98-99% HR 70.  Patient reported feeling "like I've run" after ambulating.  She was in no apparent distress.

## 2014-10-22 NOTE — Progress Notes (Signed)
The patient's daughter is concerned that she is struggling to breath.  CXR read as stable small left effusion but echo read indicates large left pleural effusion.  O2 sats on RA sitting 97-98% and 93-94% ambulatory. Will give 20mg  IV lasix now and evaluate in the AM.    HAGER, BRYAN, PAC

## 2014-10-22 NOTE — Progress Notes (Signed)
    Subjective: The patient denies any SOB, no ongoing CP or back pain, mentions mild aching to her left arm only.   Objective: Vital signs in last 24 hours: Temp:  [98.4 F (36.9 C)-98.9 F (37.2 C)] 98.4 F (36.9 C) (09/26 0452) Pulse Rate:  [81-119] 84 (09/26 0452) Resp:  [18-19] 18 (09/26 0452) BP: (90-135)/(47-76) 135/66 mmHg (09/26 0452) SpO2:  [94 %-100 %] 94 % (09/26 0452) Weight:  [123 lb 8 oz (56.019 kg)] 123 lb 8 oz (56.019 kg) (09/26 0452) Last BM Date: 10/20/14  Intake/Output from previous day: 09/25 0701 - 09/26 0700 In: 1780 [P.O.:480; I.V.:1300] Out: 600 [Urine:600] Intake/Output this shift:    Medications Scheduled Meds: . chlorhexidine  60 mL Topical Once  . diltiazem  300 mg Oral Q24H  . furosemide  40 mg Oral Daily  . lansoprazole  30 mg Oral Q24H  . metoprolol tartrate  25 mg Oral TID  . cyanocobalamin  500 mcg Oral Daily  . zolpidem  5 mg Oral QHS   Continuous Infusions: . sodium chloride 50 mL/hr at 10/21/14 0021  . sodium chloride 125 mL/hr at 10/22/14 0703   PRN Meds:.acetaminophen, ALPRAZolam, hydrocortisone, menthol-cetylpyridinium, morphine injection, ondansetron (ZOFRAN) IV  PE: General appearance: alert, cooperative and no distress Neck: no JVD Lungs: Mild bilateral crackles and decreased BS on the left base Heart: irregularly irregular rhythm and 2/6 sys MM LSB Abdomen: +BS, soft nontender Extremities: No LEE Pulses: 2+ Radials Skin: WArm and dry.  PPM site: Dressing clean,. No edema. Neurologic: Grossly normal     Assessment/Plan  Active Problems:   Essential hypertension    S/p hypotension treated with fluids, appears better, stable   Moderate aortic stenosis   Acute diastolic heart failure                  Net + 1.1, overall - 5.3L.  Plan for CXR today, no obvious signs of volume overload.   She is comfortable, speaks in full sentences on RA   Atrial fibrillation with rapid ventricular response as well as up to 5 second  pauses.       S/p PPM implant and then uptitration of betablocker with better HR control, 80's.  Chads2vasc at least 5, not on anticoagulation secondary to history of severe blood loss anemia and patient declined.  Pt c/o post pacer CP that is resolved this morning, her echo is pending.   CKD (chronic kidney disease), stage III   Protein-calorie malnutrition, severe  Ambulate in hall, discharge is pending CXR and echo findings.   LOS: 9 days    Baldwin Jamaica PA-C 10/22/2014 7:57 AM  I have seen and examined this patient with Tommye Standard.  Agree with above, note added to reflect my findings.  On exam, tachycardic and irregular, no murmurs, lungs clear.  Chest pain improved, echo shows pending.  Will discharge home should echo be normal.  Will M. Camnitz MD 10/22/2014 3:01 PM

## 2014-10-23 DIAGNOSIS — F4323 Adjustment disorder with mixed anxiety and depressed mood: Secondary | ICD-10-CM

## 2014-10-23 DIAGNOSIS — R079 Chest pain, unspecified: Secondary | ICD-10-CM

## 2014-10-23 DIAGNOSIS — Z515 Encounter for palliative care: Secondary | ICD-10-CM

## 2014-10-23 DIAGNOSIS — R0600 Dyspnea, unspecified: Secondary | ICD-10-CM

## 2014-10-23 LAB — BASIC METABOLIC PANEL
Anion gap: 10 (ref 5–15)
BUN: 21 mg/dL — AB (ref 6–20)
CALCIUM: 8.7 mg/dL — AB (ref 8.9–10.3)
CO2: 25 mmol/L (ref 22–32)
CREATININE: 0.93 mg/dL (ref 0.44–1.00)
Chloride: 104 mmol/L (ref 101–111)
GFR calc Af Amer: >60 mL/min (ref >60)
GFR, EST NON AFRICAN AMERICAN: 52 mL/min — AB (ref >60)
GLUCOSE: 112 mg/dL — AB (ref 65–99)
POTASSIUM: 3.6 mmol/L (ref 3.5–5.1)
SODIUM: 139 mmol/L (ref 135–145)

## 2014-10-23 LAB — CBC
HEMATOCRIT: 28.4 % — AB (ref 36.0–46.0)
Hemoglobin: 9 g/dL — ABNORMAL LOW (ref 12.0–15.0)
MCH: 27.7 pg (ref 26.0–34.0)
MCHC: 31.7 g/dL (ref 30.0–36.0)
MCV: 87.4 fL (ref 78.0–100.0)
PLATELETS: 165 10*3/uL (ref 150–400)
RBC: 3.25 MIL/uL — ABNORMAL LOW (ref 3.87–5.11)
RDW: 16.4 % — AB (ref 11.5–15.5)
WBC: 8.2 10*3/uL (ref 4.0–10.5)

## 2014-10-23 LAB — GLUCOSE, CAPILLARY: Glucose-Capillary: 104 mg/dL — ABNORMAL HIGH (ref 65–99)

## 2014-10-23 MED ORDER — FUROSEMIDE 10 MG/ML IJ SOLN
20.0000 mg | Freq: Once | INTRAMUSCULAR | Status: AC
Start: 2014-10-23 — End: 2014-10-23
  Administered 2014-10-23: 20 mg via INTRAVENOUS
  Filled 2014-10-23: qty 2

## 2014-10-23 MED ORDER — MORPHINE SULFATE (CONCENTRATE) 10 MG/0.5ML PO SOLN: 5.0000 mg | ORAL | Status: DC | PRN

## 2014-10-23 MED ORDER — ALPRAZOLAM 0.5 MG PO TABS
0.5000 mg | ORAL_TABLET | Freq: Three times a day (TID) | ORAL | Status: DC | PRN
  Administered 2014-10-23: 0.5 mg via ORAL
  Filled 2014-10-23: qty 1

## 2014-10-23 MED ORDER — SENNOSIDES-DOCUSATE SODIUM 8.6-50 MG PO TABS: 1.0000 | ORAL_TABLET | Freq: Every evening | ORAL | Status: DC | PRN

## 2014-10-23 NOTE — Progress Notes (Signed)
Utilization review completed.  

## 2014-10-23 NOTE — Plan of Care (Signed)
Problem: Phase III Progression Outcomes Goal: Sinus rhythm established or heart rate < 100 at rest Outcome: Not Progressing Pt remains in a-fib, with rate in the 90s to 120s. Pt tolerating without any complaints of palpitations or chest discomfort.

## 2014-10-23 NOTE — Care Management Note (Addendum)
Case Management Note Marvetta Gibbons RN, BSN Unit 2W-Case Manager [phone]  Patient Details  Name: Anna Elliott MRN: [id number] Date of Birth: May 03, 1921  Subjective/Objective:    Pt admitted with PAF, also s/p PPM on 9/23- has had issues with rate control since admission with multiple medication adjustments                Action/Plan: PTA pt lived at home with daughter- may need PC consult for [place] as pt is not making progress despite max. Medical tx.   Expected Discharge Date:       10/24/14           Expected Discharge Plan:  Home with Hospice Services  In-House Referral:  Clinical Social Work  Discharge planning Services  CM Consult  Post Acute Care Choice:    Choice offered to:     DME Arranged:    DME Agency:     HH Arranged:    [place] Agency:     Status of Service:  In process, will continue to follow  Medicare Important Message Given:  Yes-third notification given Date Medicare IM Given:    Medicare IM give by:    Date Additional Medicare IM Given:    Additional Medicare Important Message give by:     If discussed at [place] of Stay Meetings, dates discussed:  10/23/14  Additional Comments:  Dawayne Patricia, RN 10/23/2014, 11:54 AM

## 2014-10-23 NOTE — Progress Notes (Signed)
Received call from nursing director on 2W - patient's daughter has requested palliative care consult for goals of care/?need for hospice. Will place order.  Chanetta Marshall, NP 10/23/2014 1:01 PM

## 2014-10-23 NOTE — Progress Notes (Signed)
Pt noted with some episodes of shortness of breath O2 saturation has been in the low 90s to upper 90s on room 2L/O2 via nasal canula. She received an extra dose of 20 mg iv lasix in am of 1st shift. She has diuresed well with urine output of 942ml x 12 hours on 1st shift. Her breathing has not been labored this evening. She received a dose of xanax at 1423 to help her relax. Emotional support offered to pt and daughter.

## 2014-10-23 NOTE — Consult Note (Signed)
Consultation Note Date: 10/23/2014   Patient Name: Anna Elliott  DOB: 02/05/1921  MRN: [id number]  Age / Sex: 81 y.o., female   PCP: Dione Housekeeper, MD Referring Physician: Dorothy Spark, MD  Reason for Consultation: Establishing goals of care  Palliative Care Assessment and Plan Summary of Established Goals of Care and Medical Treatment Preferences  The patient is a 81 year old with PMH of HTN, chronic anemia, moderate AS, GERD/hiatal hernia, history of rheumatic fever, history of PAF with multiple recurrences, and chronic diastolic heart failure who presented to [facility] on 10-12-14 for dyspnea, with recurrent afib with RVR. The patient has had ECHO done, she has had PPM. She has been diagnosed with A fib with tachy brady. She was given IVF for hypotension on 10-21-14. She states she became severely dyspneic overnight on 10-22-14. She has also been having pleuritic chest pain off and on. The patient is elderly, lives with her daughter. She also has a pleural effusion. Daughter requested palliative consult.   The patient is resting in a chair, she is a frail elderly lady, sitting in chair, in no acute distress at the present time. The patient states that she came in for shortness of breath and for her irregular heart rate. A little while later, her daughter [name] also joined [place] in the room.   Introduced scope of hospice and palliative services. Discussed dnr dni in great detail. Discussed symptom management with opioids and benzodiazepines in detail. The patient asks, " can't you give me something to make me go to sleep for ever". Discussed in detail about adequate and appropriate symptom management with addition of hospice services. The patient's daughter is tearful herself and states that the patient is tired. She has been having either dyspnea or chest pain for the past 3 years. The patient and family are in full agreement of DNR DNI, home with hospice and use of PRN Xanax and PO Roxanol.   Thank  you for the consult. Discussed with care manager, also discussed with Chanetta Marshall NP.   Contacts/Participants in Discussion: Primary Decision Maker:  Patient, then her daughter carolyn wagner at [phone] HCPOA: yes    Code Status/Advance Care Planning:  Extensive code status discussions undertaken, patient and her daughter are tearful but state that the patient would not like to undergo CPR, intubation, mechanical ventilation, ICU etc. Discussed DNR DNI in great detail, all questions answered, will establish DNR DNI  Symptom Management:   PO Roxanol PRN pain or dyspnea.   Palliative Prophylaxis: have added senokot po prn  Additional Recommendations (Limitations, Scope, Preferences):  [facility] with [facility] [place] [facility] support  Psycho-social/Spiritual:   Support System: strong, has lived with her daughter for the past 52 years.   Desire for further Chaplaincy support:no  Prognosis: < 6 months  Discharge Planning:  Home with Hospice   Domains of Care: - Physical: dyspnea at times - Psychological: tearful, asking: "Can't you give me something to make me go to sleep and not wake up?" patient's daughter [name] and grand son are also tearful. Discussed appropriate symptom management, discussed judicious use of opioids and benzodiazepines for comfort measures - Social: widowed for the past 55 years, lives at home with daughter for past 104 years. Has excellent support at home with grand son nearby and lives with daughter - Spiritual: no acute concerns noted in initial encounter - Cultural: as above - Imminently dying: no but patient remains at risk for sudden clinical deterioration - Ethical/Legal: now established as DNR  DNI. Daughter [name] is the health care representative  Values: comfort care, management of dyspnea, D/C home with daughter Life limiting illness: A fib, moderate aortic stenosis, pleural effusion.       Chief Complaint/History of Present Illness:  dyspnea  Primary Diagnoses  Present on Admission:  . PAF (paroxysmal atrial fibrillation) . Essential hypertension . Acute diastolic heart failure . Atrial fibrillation with rapid ventricular response . CKD (chronic kidney disease), stage III . Protein-calorie malnutrition, severe  Palliative Review of Systems: completed I have reviewed the medical record, interviewed the patient and family, and examined the patient. The following aspects are pertinent.  Past Medical History  Diagnosis Date  . Hypertension   . Moderate aortic stenosis     a. Dx 2011. b. Echo 05/2012: EF 65%, mild LVH, mod AS mg 39mmHg, mildly dilated LA.  [place] Kitchen Chronic anemia     a. dating back to 2002 - BM bx: early mild dysplasia and/or sideroblastic changes.  Mar[place] Kitchen Dysphagia   . GERD (gastroesophageal reflux disease)   . Esophagitis   . Hiatal hernia   . H/O: rheumatic fever     a. as a child  . Atrial fibrillation     a. Dx 05/2012 unknown duration, started on Eliqiuis.  . Chronic diastolic heart failure   . Tubular adenoma of colon 11/2008   Social History   Social History  . Marital Status: Widowed    Spouse Name: N/A  . Number of Children: N/A  . Years of Education: N/A   Occupational History  . Retired    Social History Main Topics  . Smoking status: Never Smoker   . Smokeless tobacco: Never Used  . Alcohol Use: No  . Drug Use: No  . Sexual Activity: No   Other Topics Concern  . None   Social History Narrative   Lives in [place] with her dtr and son-in-law.  She is active around the house and occasionally goes for walks at the mall.  No h/o falls though she notes that her macular degeneration limits her vision somewhat and she is prone to losing her balance on inclines.   Family History  Problem Relation Age of Onset  . Diabetes Father   . Heart attack Father   . Heart disease Brother   . Colon cancer Brother   . Lung cancer Sister   . Lymphoma Sister   . Macular degeneration Mother     Scheduled Meds: . chlorhexidine  60 mL Topical Once  . diltiazem  300 mg Oral Q24H  . furosemide  40 mg Oral Daily  . lansoprazole  30 mg Oral Q24H  . metoprolol tartrate  37.5 mg Oral BID  . cyanocobalamin  500 mcg Oral Daily  . zolpidem  5 mg Oral QHS   Continuous Infusions:  PRN Meds:.acetaminophen, ALPRAZolam, hydrocortisone, menthol-cetylpyridinium, morphine injection, morphine CONCENTRATE, ondansetron (ZOFRAN) IV, senna-docusate Medications Prior to Admission:  Prior to Admission medications   Medication Sig Start Date End Date Taking? Authorizing Provider  ALPRAZolam (XANAX) 0.25 MG tablet Take 0.25 mg by mouth 3 (three) times daily as needed (anxiety).  10/02/14 10/02/15 Yes Historical Provider, MD  diltiazem (CARDIZEM CD) 300 MG 24 hr capsule Take 1 capsule (300 mg total) by mouth daily before breakfast. 06/29/14  Yes Almyra Deforest, PA  furosemide (LASIX) 20 MG tablet Take 0.5 tablets (10 mg total) by mouth daily. 06/29/14  Yes Almyra Deforest, PA  lansoprazole (PREVACID SOLUTAB) 30 MG disintegrating tablet Take 30 mg by mouth daily.  Yes Historical Provider, MD  Multiple Vitamins-Minerals (EYE VITAMINS PO) Take 1 tablet by mouth daily.   Yes Historical Provider, MD  zolpidem (AMBIEN) 10 MG tablet Take 5 mg by mouth at bedtime.  09/14/11  Yes Historical Provider, MD   Allergies  Allergen Reactions  . Amiodarone Shortness Of Breath    Pulmonary distress Possible pulmonary reaction  . Codeine Itching and Nausea And Vomiting    Makes her sick  . Digoxin And Related Other (See Comments)    Causes "insides to quiver" "very weak"  . Potassium Chloride     Loss of appetite  . Resource Breeze [Alitraq] Other (See Comments)    Patient states Resource breeze "gets her heart racing." She does not want it to be offered to her ever again in the hospital   . Diona Fanti [Aspirin] Other (See Comments) and Rash    Acid reflux  . Ramipril Rash  . Sulfa Antibiotics Rash  . Sulfonamide Derivatives Itching and  Rash   CBC:    Component Value Date/Time   WBC 8.2 10/23/2014 0426   WBC 4.5 04/13/2014 1251   HGB 9.0* 10/23/2014 0426   HGB 9.8* 04/13/2014 1251   HCT 28.4* 10/23/2014 0426   HCT 31.0* 04/13/2014 1251   PLT 165 10/23/2014 0426   PLT 184 04/13/2014 1251   MCV 87.4 10/23/2014 0426   MCV 89.3 04/13/2014 1251   NEUTROABS 4.6 10/12/2014 1505   NEUTROABS 3.4 04/13/2014 1251   LYMPHSABS 0.7 10/12/2014 1505   LYMPHSABS 0.6* 04/13/2014 1251   MONOABS 0.5 10/12/2014 1505   MONOABS 0.3 04/13/2014 1251   EOSABS 0.0 10/12/2014 1505   EOSABS 0.1 04/13/2014 1251   BASOSABS 0.0 10/12/2014 1505   BASOSABS 0.0 04/13/2014 1251   Comprehensive Metabolic Panel:    Component Value Date/Time   NA 139 10/23/2014 0426   NA 141 04/13/2014 1251   K 3.6 10/23/2014 0426   K 3.8 04/13/2014 1251   CL 104 10/23/2014 0426   CO2 25 10/23/2014 0426   CO2 26 04/13/2014 1251   BUN 21* 10/23/2014 0426   BUN 24.5 04/13/2014 1251   CREATININE 0.93 10/23/2014 0426   CREATININE 1.0 04/13/2014 1251   GLUCOSE 112* 10/23/2014 0426   GLUCOSE 139 04/13/2014 1251   CALCIUM 8.7* 10/23/2014 0426   CALCIUM 8.6 04/13/2014 1251   AST 52* 10/12/2014 1505   AST 10 04/13/2014 1251   ALT 53 10/12/2014 1505   ALT 6 04/13/2014 1251   ALKPHOS 76 10/12/2014 1505   ALKPHOS 76 04/13/2014 1251   BILITOT 2.0* 10/12/2014 1505   BILITOT 1.04 04/13/2014 1251   PROT 6.3* 10/12/2014 1505   PROT 6.2* 04/13/2014 1251   ALBUMIN 3.9 10/12/2014 1505   ALBUMIN 3.9 04/13/2014 1251    Physical Exam: Vital Signs: BP 120/60 mmHg  Pulse 120  Temp(Src) 98.3 F (36.8 C) (Oral)  Resp 18  Ht 5\' 3"  (1.6 m)  Wt 56.836 kg (125 lb 4.8 oz)  BMI 22.20 kg/m2  SpO2 99% SpO2: SpO2: 99 % O2 Device: O2 Device: Nasal Cannula O2 Flow Rate: O2 Flow Rate (L/min): 2 L/min Intake/output summary:  Intake/Output Summary (Last 24 hours) at 10/23/14 1356 Last data filed at 10/23/14 1332  Gross per 24 hour  Intake   1520 ml  Output   2800 ml   Net  -1280 ml   LBM: Last BM Date: 10/22/14 Baseline Weight: Weight: 54.7 kg (120 lb 9.5 oz) Most recent weight: Weight: 56.836 kg (125 lb 4.8  oz)  Exam Findings:  Weak frail elderly lady sitting in chair tearful Diminished Irregular No edema Awake alert                      Patient has diminished appetite, she has no desire for eating.   Palliative Performance Scale: 20%   Additional Data Reviewed: Recent Labs     10/23/14  0426  WBC  8.2  HGB  9.0*  PLT  165  NA  139  BUN  21*  CREATININE  0.93     Time In: 1255 Time Out: 1355 Time Total: 60 minutes  Greater than 50%  of this time was spent counseling and coordinating care related to the above assessment and plan.  Signed by: Loistine Chance, MD [place], MD  10/23/2014, 1:56 PM  Please contact Palliative Medicine Team phone at [phone] for questions and concerns.

## 2014-10-23 NOTE — Progress Notes (Signed)
Patient states, "breathing is a little better this morning". VSS, patient resting comfortably, call bell within reach, pt. Instructed to call RN if assistance needed, call bell within reach.  Candise Che, RN

## 2014-10-23 NOTE — Progress Notes (Signed)
Patient states, " it feels like it's hard for me to breathe". RN assessed, O2 Sat @ 98% on 2L O2, RR 20, patient speaking in complete sentences, lungs sound clear with crackles at the lower bases. Pt. Placed in high fowlers, pt. Currently sleeping, RN will continue to monitor.   Sharene Skeans, RN

## 2014-10-23 NOTE — Care Management Note (Addendum)
Case Management Note Marvetta Gibbons RN, BSN Unit 2W-Case Manager [phone]  Patient Details  Name: Anna Elliott MRN: [id number] Date of Birth: 12-27-1921  Subjective/Objective:    Pt admitted with PAF, also s/p PPM on 9/23- has had issues with rate control since admission with multiple medication adjustments                Action/Plan: PTA pt lived at home with daughter- may need PC consult for [place] as pt is not making progress despite max. Medical tx.   Expected Discharge Date:       10/24/14           Expected Discharge Plan:  Home with Hospice Services  In-House Referral:  Clinical Social Work  Discharge planning Services  CM Consult  Post Acute Care Choice:  Hospice Choice offered to:  Patient, Adult Children  DME Arranged:  Oxygen, Bedside commode DME Agency:  [place]  HH Arranged:  Disease Management, RN [place] Agency:  [facility]  Status of Service:  Completed, signed off  Medicare Important Message Given:  Yes-third notification given Date Medicare IM Given:    Medicare IM give by:    Date Additional Medicare IM Given:    Additional Medicare Important Message give by:     If discussed at [place] of Stay Meetings, dates discussed:  10/23/14  Additional Comments:  10/23/14- 1400- Marvetta Gibbons RN, BSN [phone]- received referral for home hospice- in to speak with pt and daughter-offered choice for home hospice and confirmed Home Hospice choice- to be [facility]- per pt she wants to go home and be comfortable- discussed needed equipment for home- pt states that she does not want a hospital bed- does need a BSC, and will need home 02- no other DME noted. Per daughter plan is to transport pt via private vehicle. Referral called to [facility] at [facility]- DME to be delivered by [facility] - pt will need a Gold DNR form for home. Due to hour in the day Hospice will be able to admit pt tomorrow AM- have called Amber  NP with EP to let rounding team know that Hospice referral has been done and plan will be to d/c pt in the am home with Hospice once everything arranged for Hospice today and home 02 has been delivered to home- daughter to bring tank from home to take pt home with. Have placed Gold DNR on shadow chart for MD to sign. Will fax paperwork to Hospice [phone])  Dawayne Patricia, RN 10/23/2014, 2:15 PM

## 2014-10-24 MED ORDER — METOPROLOL TARTRATE 37.5 MG PO TABS: 37.5000 mg | ORAL_TABLET | Freq: Two times a day (BID) | ORAL | Status: DC

## 2014-10-24 MED ORDER — ALPRAZOLAM 0.5 MG PO TABS: 0.5000 mg | ORAL_TABLET | Freq: Three times a day (TID) | ORAL | Status: AC | PRN

## 2014-10-24 MED ORDER — FUROSEMIDE 20 MG PO TABS: 20.0000 mg | ORAL_TABLET | Freq: Every day | ORAL | Status: AC

## 2014-10-24 MED ORDER — MORPHINE SULFATE (CONCENTRATE) 10 MG/0.5ML PO SOLN: 5.0000 mg | ORAL | Status: AC | PRN

## 2014-10-24 NOTE — Care Management Note (Signed)
Case Management Note Marvetta Gibbons RN, BSN Unit 2W-Case Manager [phone]  Patient Details  Name: Anna Elliott MRN: [id number] Date of Birth: Jul 08, 1921  Subjective/Objective:    Pt admitted with PAF, also s/p PPM on 9/23- has had issues with rate control since admission with multiple medication adjustments                Action/Plan: PTA pt lived at home with daughter- may need PC consult for [place] as pt is not making progress despite max. Medical tx.   Expected Discharge Date:       10/24/14           Expected Discharge Plan:  Home with Hospice Services  In-House Referral:  Clinical Social Work  Discharge planning Services  CM Consult  Post Acute Care Choice:  Hospice Choice offered to:  Patient, Adult Children  DME Arranged:  Oxygen, Bedside commode DME Agency:  [place]  HH Arranged:  Disease Management, RN [place] Agency:  [facility]  Status of Service:  Completed, signed off  Medicare Important Message Given:  Yes-third notification given Date Medicare IM Given:    Medicare IM give by:    Date Additional Medicare IM Given:    Additional Medicare Important Message give by:     If discussed at [place] of Stay Meetings, dates discussed:  10/23/14  Additional Comments:  10/24/14- 1000- Marvetta Gibbons RN, BSN [phone]- informed by bedside RN that pt's daughter is here to take pt home but does not have 02 tank with her from home- call made to [place] at [place] to check on 02 status- per [place] was in route to home to deliver home 02 and portable tank- driver will now re-route to hospital to deliver portable tank then deliver to home the concentrator - informed pt's daughter of this in room - will wait here until 02 tank arrives. Per request of Beth at Hospice have also faxed pt's prescriptions to [facility]- daughter has originals- along with [place] DNR for home. D/C summary has been faxed to [facility] at [facility]  also.   10/23/14- 1400- Marvetta Gibbons RN, BSN [phone]- received referral for home hospice- in to speak with pt and daughter-offered choice for home hospice and confirmed Home Hospice choice- to be [facility]- per pt she wants to go home and be comfortable- discussed needed equipment for home- pt states that she does not want a hospital bed- does need a BSC, and will need home 02- no other DME noted. Per daughter plan is to transport pt via private vehicle. Referral called to [facility] at [facility]- DME to be delivered by [facility] - pt will need a Gold DNR form for home. Due to hour in the day Hospice will be able to admit pt tomorrow AM- have called Amber NP with EP to let rounding team know that Hospice referral has been done and plan will be to d/c pt in the am home with Hospice once everything arranged for Hospice today and home 02 has been delivered to home- daughter to bring tank from home to take pt home with. Have placed Gold DNR on shadow chart for MD to sign. Will fax paperwork to Hospice [phone])  Dawayne Patricia, RN 10/24/2014, 10:40 AM

## 2014-10-31 ENCOUNTER — Ambulatory Visit: Payer: Medicare Other

## 2014-11-30 ENCOUNTER — Other Ambulatory Visit: Payer: Self-pay | Admitting: Cardiology

## 2014-12-27 ENCOUNTER — Other Ambulatory Visit: Payer: Self-pay

## 2014-12-27 MED ORDER — METOPROLOL TARTRATE 37.5 MG PO TABS: 37.5000 mg | ORAL_TABLET | Freq: Two times a day (BID) | ORAL | Status: DC

## 2015-01-04 ENCOUNTER — Other Ambulatory Visit: Payer: Self-pay | Admitting: Physician Assistant

## 2015-01-22 ENCOUNTER — Encounter: Admitting: Cardiology

## 2015-02-01 ENCOUNTER — Other Ambulatory Visit: Payer: Self-pay | Admitting: Cardiology

## 2015-02-01 ENCOUNTER — Ambulatory Visit: Payer: Medicare Other | Admitting: Cardiology

## 2015-02-01 MED ORDER — METOPROLOL TARTRATE 25 MG PO TABS: 37.5000 mg | ORAL_TABLET | Freq: Two times a day (BID) | ORAL | Status: AC

## 2015-02-19 ENCOUNTER — Telehealth: Payer: Self-pay | Admitting: *Deleted

## 2015-02-19 NOTE — Telephone Encounter (Signed)
I spoke with paitent's dtr on 01/23/15 to discuss post pacemaker implant follow up that had been cancelled.  She explains that her mother has not been out of the house since returning home from the hospital with hospice. She is unsure if patient will be able to come to office again. Explained that I would call her next month to check on status of patient.  Called dtr on 02/12/15 to see how patient is doing.  Dtr tells me she is doing somewhat better and hospice is no longer seeing the patient. She isn't sure prognosis or next step in plan of care.  They are seeing Dr. Edrick Oh mid February to determine plan of care for mom.  She would like to see what this appt brings before deciding on pacemaker follow up/care. We agreed that I would check in with them after appt in February to determine if patient will need to be followed for PPM or if we would not be doing anything for her.  Dtr was agreeable to this plan.

## 2015-03-17 ENCOUNTER — Emergency Department (HOSPITAL_COMMUNITY): Payer: Medicare Other

## 2015-03-17 ENCOUNTER — Emergency Department (HOSPITAL_COMMUNITY)
Admission: EM | Admit: 2015-03-17 | Discharge: 2015-03-17 | Disposition: A | Discharge status: Home / Self Care | Payer: Medicare Other | Attending: Emergency Medicine | Admitting: Emergency Medicine

## 2015-03-17 ENCOUNTER — Encounter (HOSPITAL_COMMUNITY): Payer: Self-pay | Admitting: Emergency Medicine

## 2015-03-17 DIAGNOSIS — R05 Cough: Secondary | ICD-10-CM | POA: Diagnosis present

## 2015-03-17 DIAGNOSIS — J40 Bronchitis, not specified as acute or chronic: Secondary | ICD-10-CM

## 2015-03-17 DIAGNOSIS — Z79899 Other long term (current) drug therapy: Secondary | ICD-10-CM | POA: Diagnosis not present

## 2015-03-17 DIAGNOSIS — I4891 Unspecified atrial fibrillation: Secondary | ICD-10-CM | POA: Insufficient documentation

## 2015-03-17 DIAGNOSIS — Z79891 Long term (current) use of opiate analgesic: Secondary | ICD-10-CM | POA: Insufficient documentation

## 2015-03-17 DIAGNOSIS — Z950 Presence of cardiac pacemaker: Secondary | ICD-10-CM | POA: Insufficient documentation

## 2015-03-17 DIAGNOSIS — I5032 Chronic diastolic (congestive) heart failure: Secondary | ICD-10-CM | POA: Insufficient documentation

## 2015-03-17 DIAGNOSIS — Z792 Long term (current) use of antibiotics: Secondary | ICD-10-CM | POA: Diagnosis not present

## 2015-03-17 DIAGNOSIS — D649 Anemia, unspecified: Secondary | ICD-10-CM | POA: Insufficient documentation

## 2015-03-17 DIAGNOSIS — I10 Essential (primary) hypertension: Secondary | ICD-10-CM | POA: Insufficient documentation

## 2015-03-17 LAB — COMPREHENSIVE METABOLIC PANEL
ALK PHOS: 62 U/L (ref 38–126)
ALT: 13 U/L — ABNORMAL LOW (ref 14–54)
ANION GAP: 10 (ref 5–15)
AST: 19 U/L (ref 15–41)
Albumin: 3.9 g/dL (ref 3.5–5.0)
BILIRUBIN TOTAL: 1.9 mg/dL — AB (ref 0.3–1.2)
BUN: 16 mg/dL (ref 6–20)
CALCIUM: 8.8 mg/dL — AB (ref 8.9–10.3)
CO2: 29 mmol/L (ref 22–32)
Chloride: 101 mmol/L (ref 101–111)
Creatinine, Ser: 1.01 mg/dL — ABNORMAL HIGH (ref 0.44–1.00)
GFR, EST AFRICAN AMERICAN: 55 mL/min — AB (ref >60)
GFR, EST NON AFRICAN AMERICAN: 47 mL/min — AB (ref >60)
Glucose, Bld: 102 mg/dL — ABNORMAL HIGH (ref 65–99)
Potassium: 4.3 mmol/L (ref 3.5–5.1)
SODIUM: 140 mmol/L (ref 135–145)
TOTAL PROTEIN: 6.8 g/dL (ref 6.5–8.1)

## 2015-03-17 LAB — CBC WITH DIFFERENTIAL/PLATELET
BASOS ABS: 0 10*3/uL (ref 0.0–0.1)
BASOS PCT: 0 %
Eosinophils Absolute: 0 10*3/uL (ref 0.0–0.7)
Eosinophils Relative: 0 %
HCT: 32.4 % — ABNORMAL LOW (ref 36.0–46.0)
HEMOGLOBIN: 10.5 g/dL — AB (ref 12.0–15.0)
LYMPHS PCT: 13 %
Lymphs Abs: 0.8 10*3/uL (ref 0.7–4.0)
MCH: 28.8 pg (ref 26.0–34.0)
MCHC: 32.4 g/dL (ref 30.0–36.0)
MCV: 88.8 fL (ref 78.0–100.0)
MONO ABS: 0.6 10*3/uL (ref 0.1–1.0)
Monocytes Relative: 10 %
NEUTROS ABS: 4.5 10*3/uL (ref 1.7–7.7)
Neutrophils Relative %: 77 %
Platelets: 244 10*3/uL (ref 150–400)
RBC: 3.65 MIL/uL — ABNORMAL LOW (ref 3.87–5.11)
RDW: 15.4 % (ref 11.5–15.5)
WBC: 6 10*3/uL (ref 4.0–10.5)

## 2015-03-17 MED ORDER — SODIUM CHLORIDE 0.9 % IV BOLUS (SEPSIS)
500.0000 mL | Freq: Once | INTRAVENOUS | Status: AC
  Administered 2015-03-17: 500 mL via INTRAVENOUS

## 2015-03-17 MED ORDER — DEXTROSE 5 % IV SOLN
1.0000 g | Freq: Once | INTRAVENOUS | Status: AC
  Administered 2015-03-17: 1 g via INTRAVENOUS
  Filled 2015-03-17: qty 10

## 2015-03-17 MED ORDER — ALBUTEROL SULFATE HFA 108 (90 BASE) MCG/ACT IN AERS: 1.0000 | INHALATION_SPRAY | Freq: Four times a day (QID) | RESPIRATORY_TRACT | Status: AC | PRN

## 2015-03-17 MED ORDER — ENSURE ENLIVE PO LIQD
237.0000 mL | Freq: Two times a day (BID) | ORAL | Status: DC
  Filled 2015-03-17 (×2): qty 237

## 2015-03-17 MED ORDER — LEVOFLOXACIN 500 MG PO TABS: 500.0000 mg | ORAL_TABLET | Freq: Every day | ORAL | Status: AC

## 2015-03-17 MED ORDER — ALBUTEROL SULFATE (2.5 MG/3ML) 0.083% IN NEBU: 2.5000 mg | INHALATION_SOLUTION | Freq: Four times a day (QID) | RESPIRATORY_TRACT | Status: AC | PRN

## 2015-03-17 MED ORDER — ALBUTEROL SULFATE (2.5 MG/3ML) 0.083% IN NEBU
2.5000 mg | INHALATION_SOLUTION | Freq: Once | RESPIRATORY_TRACT | Status: AC
  Administered 2015-03-17: 2.5 mg via RESPIRATORY_TRACT
  Filled 2015-03-17: qty 3

## 2015-03-17 MED ORDER — DEXTROSE 5 % IV SOLN
500.0000 mg | Freq: Once | INTRAVENOUS | Status: AC
  Administered 2015-03-17: 500 mg via INTRAVENOUS
  Filled 2015-03-17: qty 500

## 2015-03-17 MED ORDER — ALBUTEROL SULFATE HFA 108 (90 BASE) MCG/ACT IN AERS
2.0000 | INHALATION_SPRAY | Freq: Four times a day (QID) | RESPIRATORY_TRACT | Status: DC | PRN
  Administered 2015-03-17: 2 via RESPIRATORY_TRACT
  Filled 2015-03-17 (×2): qty 6.7

## 2015-03-17 NOTE — Discharge Instructions (Signed)
X-ray shows a very small area in your right lung which could be infection. You will need a repeat chest x-ray in 2-3 weeks. Prescription for antibiotic, inhaler, nebulizer solution, nebulizer machine. Increase fluids. Eat regular meals. Follow-up your primary care doctor or return if worse.

## 2015-03-17 NOTE — ED Provider Notes (Signed)
[facility] CSN: [id number]     Arrival date & time 03/17/15  1200 History   First MD Initiated Contact with Patient 03/17/15 1507     Chief Complaint  Patient presents with  . Cough     (Consider location/radiation/quality/duration/timing/severity/associated sxs/prior Treatment) HPI .....Marland KitchenMarland Kitchen prodromal upper respiratory infection earlier this month. She was seen by her primary care doctor and placed on Zithromax. She lives at home independently with her daughter. She is still coughing. She is able to eat and ambulate around the house. Severity of symptoms is moderate. Her primary care doctor is Dr. Edrick Oh. No fevers, sweats, chills. Past Medical History  Diagnosis Date  . Hypertension   . Moderate aortic stenosis     a. Dx 2011. b. Echo 05/2012: EF 65%, mild LVH, mod AS mg 61mmHg, mildly dilated LA.  Marland Kitchen Chronic anemia     a. dating back to 2002 - BM bx: early mild dysplasia and/or sideroblastic changes.  Marland Kitchen Dysphagia   . GERD (gastroesophageal reflux disease)   . Esophagitis   . Hiatal hernia   . H/O: rheumatic fever     a. as a child  . Atrial fibrillation ([place])     a. Dx 05/2012 unknown duration, started on Eliqiuis.  . Chronic diastolic heart failure ([place])   . Tubular adenoma of colon 11/2008   Past Surgical History  Procedure Laterality Date  . Tonsillectomy    . Cesarean section  1962  . Abdominal hysterectomy  1967  . Cataract extraction w/ intraocular lens  implant, bilateral Bilateral 1990's  . Esophageal dilation      "twice in her lifetime, I think; most recent was early 2000's" (06/22/2012)  . Tee without cardioversion N/A 07/04/2012    Procedure: TRANSESOPHAGEAL ECHOCARDIOGRAM (TEE);  Surgeon: Fay Records, MD;  Location: [place];  Service: Cardiovascular;  Laterality: N/A;  talk to [facility]  . Cardioversion N/A 07/04/2012    Procedure: CARDIOVERSION;  Surgeon: Fay Records, MD;  Location: [place];  Service: Cardiovascular;  Laterality: N/A;  . Ep implantable device N/A  10/19/2014    Procedure: Pacemaker Implant;  Surgeon: Will Meredith Leeds, MD;  Location: [facility];  Service: Cardiovascular;  Laterality: N/A;   Family History  Problem Relation Age of Onset  . Diabetes Father   . Heart attack Father   . Heart disease Brother   . Colon cancer Brother   . Lung cancer Sister   . Lymphoma Sister   . Macular degeneration Mother    Social History  Substance Use Topics  . Smoking status: Never Smoker   . Smokeless tobacco: Never Used  . Alcohol Use: No   OB History    Gravida Para Term Preterm AB TAB SAB Ectopic Multiple Living   3 3 3       3      Review of Systems  All other systems reviewed and are negative.     Allergies  Amiodarone; Codeine; Digoxin and related; Latex; Potassium chloride; Resource breeze; Asa; Ramipril; Sulfa antibiotics; and Sulfonamide derivatives  Home Medications   Prior to Admission medications   Medication Sig Start Date End Date Taking? Authorizing Provider  albuterol (PROVENTIL HFA;VENTOLIN HFA) 108 (90 Base) MCG/ACT inhaler Inhale 1-2 puffs into the lungs every 6 (six) hours as needed for wheezing or shortness of breath. 03/17/15   Nat Christen, MD  albuterol (PROVENTIL) (2.5 MG/3ML) 0.083% nebulizer solution Take 3 mLs (2.5 mg total) by nebulization every 6 (six) hours as needed for wheezing  or shortness of breath. Please dispense with new nebulizer machine. 03/17/15   Nat Christen, MD  ALPRAZolam Duanne Moron) 0.5 MG tablet Take 1 tablet (0.5 mg total) by mouth 3 (three) times daily as needed (anxiety). 10/24/14   Renee Dyane Dustman, PA-C  diltiazem (CARDIZEM CD) 300 MG 24 hr capsule TAKE 1 CAPSULE (300 MG TOTAL) BY MOUTH DAILY BEFORE BREAKFAST. 01/04/15   Darlin Coco, MD  furosemide (LASIX) 20 MG tablet Take 1 tablet (20 mg total) by mouth daily. 10/24/14   Renee Dyane Dustman, PA-C  lansoprazole (PREVACID SOLUTAB) 30 MG disintegrating tablet Take 30 mg by mouth daily.    Historical Provider, MD  levofloxacin  (LEVAQUIN) 500 MG tablet Take 1 tablet (500 mg total) by mouth daily. 03/17/15   Nat Christen, MD  metoprolol tartrate (LOPRESSOR) 25 MG tablet Take 1.5 tablets (37.5 mg total) by mouth 2 (two) times daily. 02/01/15   Will Meredith Leeds, MD  Morphine Sulfate (MORPHINE CONCENTRATE) 10 MG/0.5ML SOLN concentrated solution Take 0.25 mLs (5 mg total) by mouth every 2 (two) hours as needed for moderate pain, severe pain, anxiety or shortness of breath. 10/24/14   Renee Dyane Dustman, PA-C  Multiple Vitamins-Minerals (EYE VITAMINS PO) Take 1 tablet by mouth daily.    Historical Provider, MD  zolpidem (AMBIEN) 10 MG tablet Take 5 mg by mouth at bedtime.  09/14/11   Historical Provider, MD   BP 123/84 mmHg  Pulse 117  Temp(Src) 97.8 F (36.6 C) (Oral)  Resp 14  Ht 5' 3.5" (1.613 m)  SpO2 99% Physical Exam  Constitutional: She is oriented to person, place, and time.  She is alert and pleasant.  HENT:  Head: Normocephalic and atraumatic.  Eyes: Conjunctivae and EOM are normal. Pupils are equal, round, and reactive to light.  Neck: Normal range of motion. Neck supple.  Cardiovascular: Normal rate and regular rhythm.   Pulmonary/Chest: Effort normal.  Scattered rhonchi throughout both lung fields.  Abdominal: Soft. Bowel sounds are normal.  Musculoskeletal: Normal range of motion.  Neurological: She is alert and oriented to person, place, and time.  Skin: Skin is warm and dry.  Psychiatric: She has a normal mood and affect. Her behavior is normal.  Nursing note and vitals reviewed.   ED Course  Procedures (including critical care time) Labs Review Labs Reviewed  CBC WITH DIFFERENTIAL/PLATELET - Abnormal; Notable for the following:    RBC 3.65 (*)    Hemoglobin 10.5 (*)    HCT 32.4 (*)    All other components within normal limits  COMPREHENSIVE METABOLIC PANEL - Abnormal; Notable for the following:    Glucose, Bld 102 (*)    Creatinine, Ser 1.01 (*)    Calcium 8.8 (*)    ALT 13 (*)    Total  Bilirubin 1.9 (*)    GFR calc non Af Amer 47 (*)    GFR calc Af Amer 55 (*)    All other components within normal limits    Imaging Review Dg Chest 2 View  03/17/2015  CLINICAL DATA:  Cough and shortness of breath EXAM: CHEST  2 VIEW COMPARISON:  10/22/2014 chest radiograph. FINDINGS: Single lead left subclavian pacemaker is stable in configuration with lead tip overlying the right ventricle. Stable cardiomediastinal silhouette with mild cardiomegaly. No pneumothorax. Trace bilateral pleural effusions. Hyperinflated lungs. No pulmonary edema. New vague irregular focal density in the right upper lung. Minimal curvilinear opacities at the lung bases. IMPRESSION: 1. Stable mild cardiomegaly without pulmonary edema. 2. Trace bilateral pleural  effusions. 3. Hyperinflated lungs, suggesting COPD. 4. New vague irregular focal density in the right upper lung, which is nonspecific and could be inflammatory, with a pulmonary nodule not excluded. Recommend either short-term follow-up PA and lateral chest radiographs or further evaluation with chest CT. Electronically Signed   By: Ilona Sorrel M.D.   On: 03/17/2015 13:40   I have personally reviewed and evaluated these images and lab results as part of my medical decision-making.   EKG Interpretation   Date/Time:  Sunday March 17 2015 12:54:27 EST Ventricular Rate:  83 PR Interval:    QRS Duration: 86 QT Interval:  414 QTC Calculation: 486 R Axis:   64 Text Interpretation:  Undetermined rhythm ST \\T \ T wave abnormality,  consider anterolateral ischemia Prolonged QT Abnormal ECG Confirmed by  Raylea Adcox  MD, Yerachmiel Spinney ([id number]) on 03/17/2015 4:32:59 PM      MDM   Final diagnoses:  Bronchitis    A she is oxygenating in the high 90s without nasal oxygen. Her pulses has normalized below 100. Chest x-ray shows a new vague irregular density in the right upper lung which is nonspecific. I discussed the findings with the patient and her daughter. Patient was  given Zithromax and Rocephin in the ED. Discharge medications include Levaquin, albuterol inhaler, albuterol nebulizer solution.    Nat Christen, MD 03/17/15 [phone]

## 2015-03-17 NOTE — ED Notes (Signed)
Patient c/o congested cough but unable to get any sputum. Patient seen by PCP and given Z-pack with no improvement. Patient unsure of any fevers. Per patient is short of breath. HX of CHF and Afib.

## 2015-04-26 NOTE — Telephone Encounter (Addendum)
Followed up with pt's dtr. Dtr states that patient does not want to come back to our office to follow up w/ PPM. Briefly discussed reasoning of need for follow up with a device implanted. She still does not want to come to office. Will review with device clinic and Dr. Curt Bears when he is in office next week.

## 2016-02-27 DEATH — deceased

## 2016-06-27 IMAGING — DX DG CHEST 2V
2 series · 2 of 2 positions shown · non-contrast
Comparison: Bone PA and lateral chest 10/15/2014.

CLINICAL DATA: Status post pacemaker placement today.  Tachycardia.

EXAM:
CHEST  2 VIEW

[x chest ap]
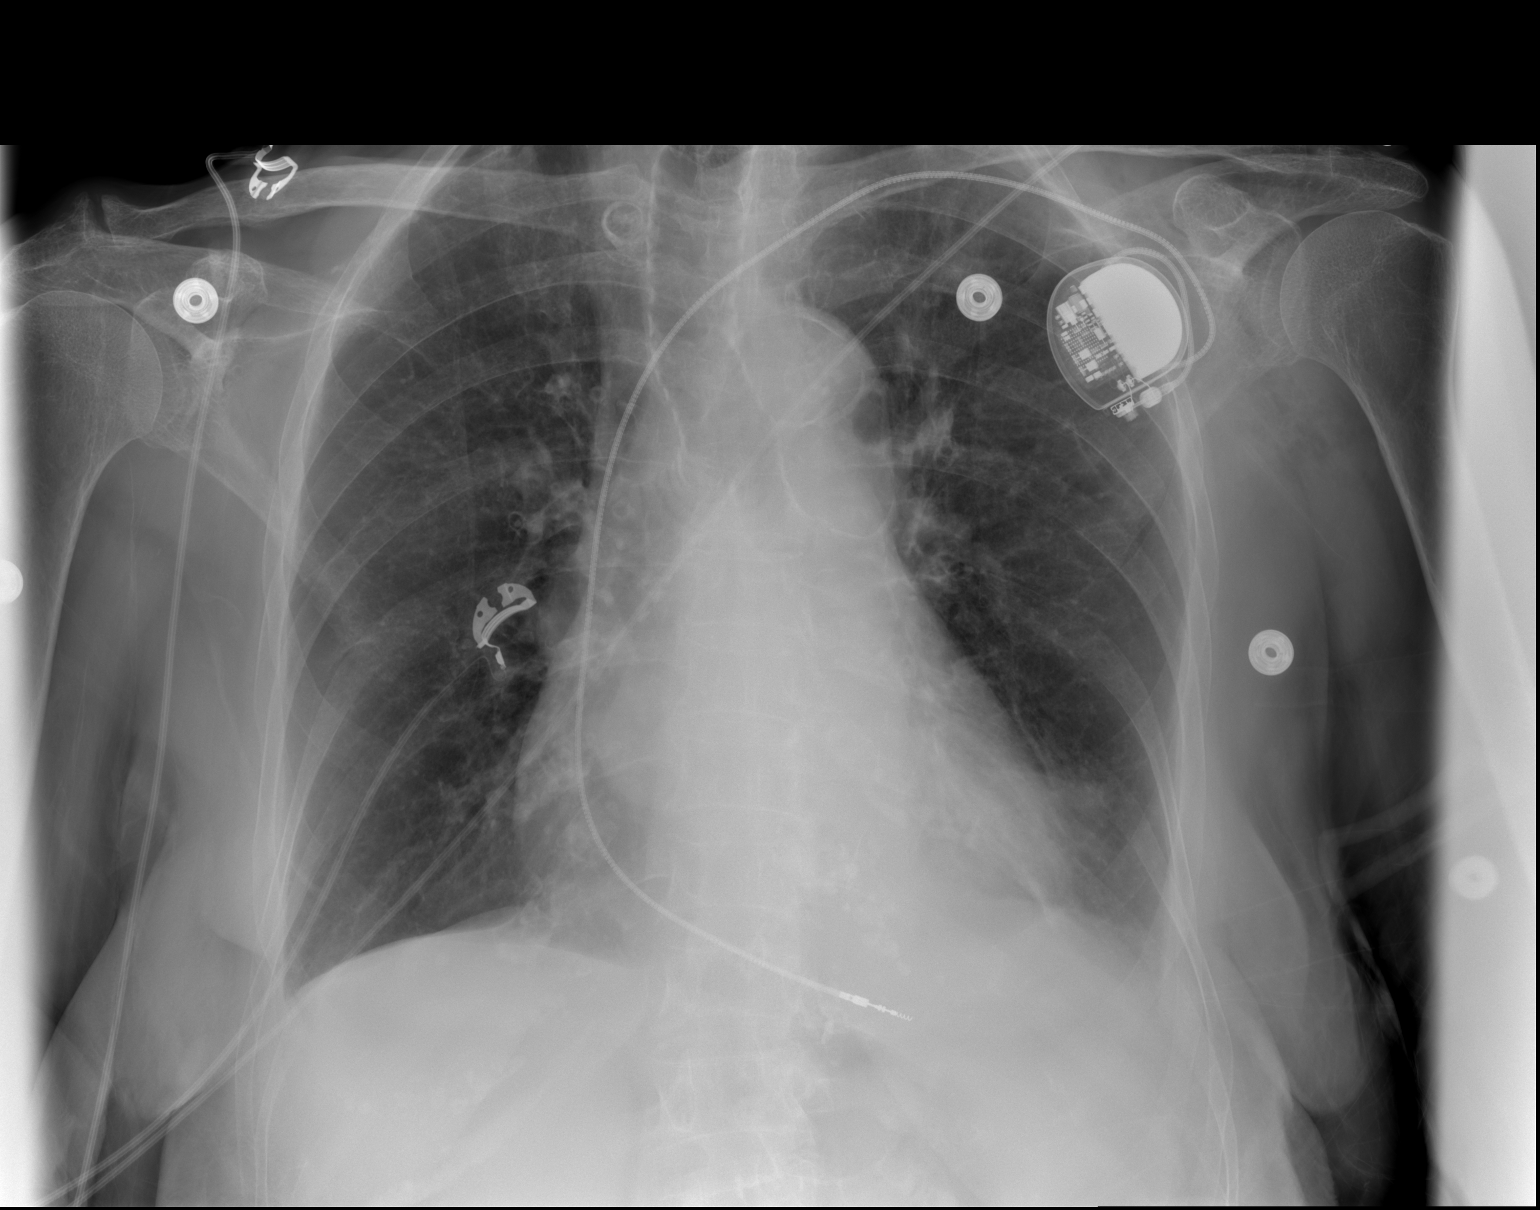

[w chest lat]
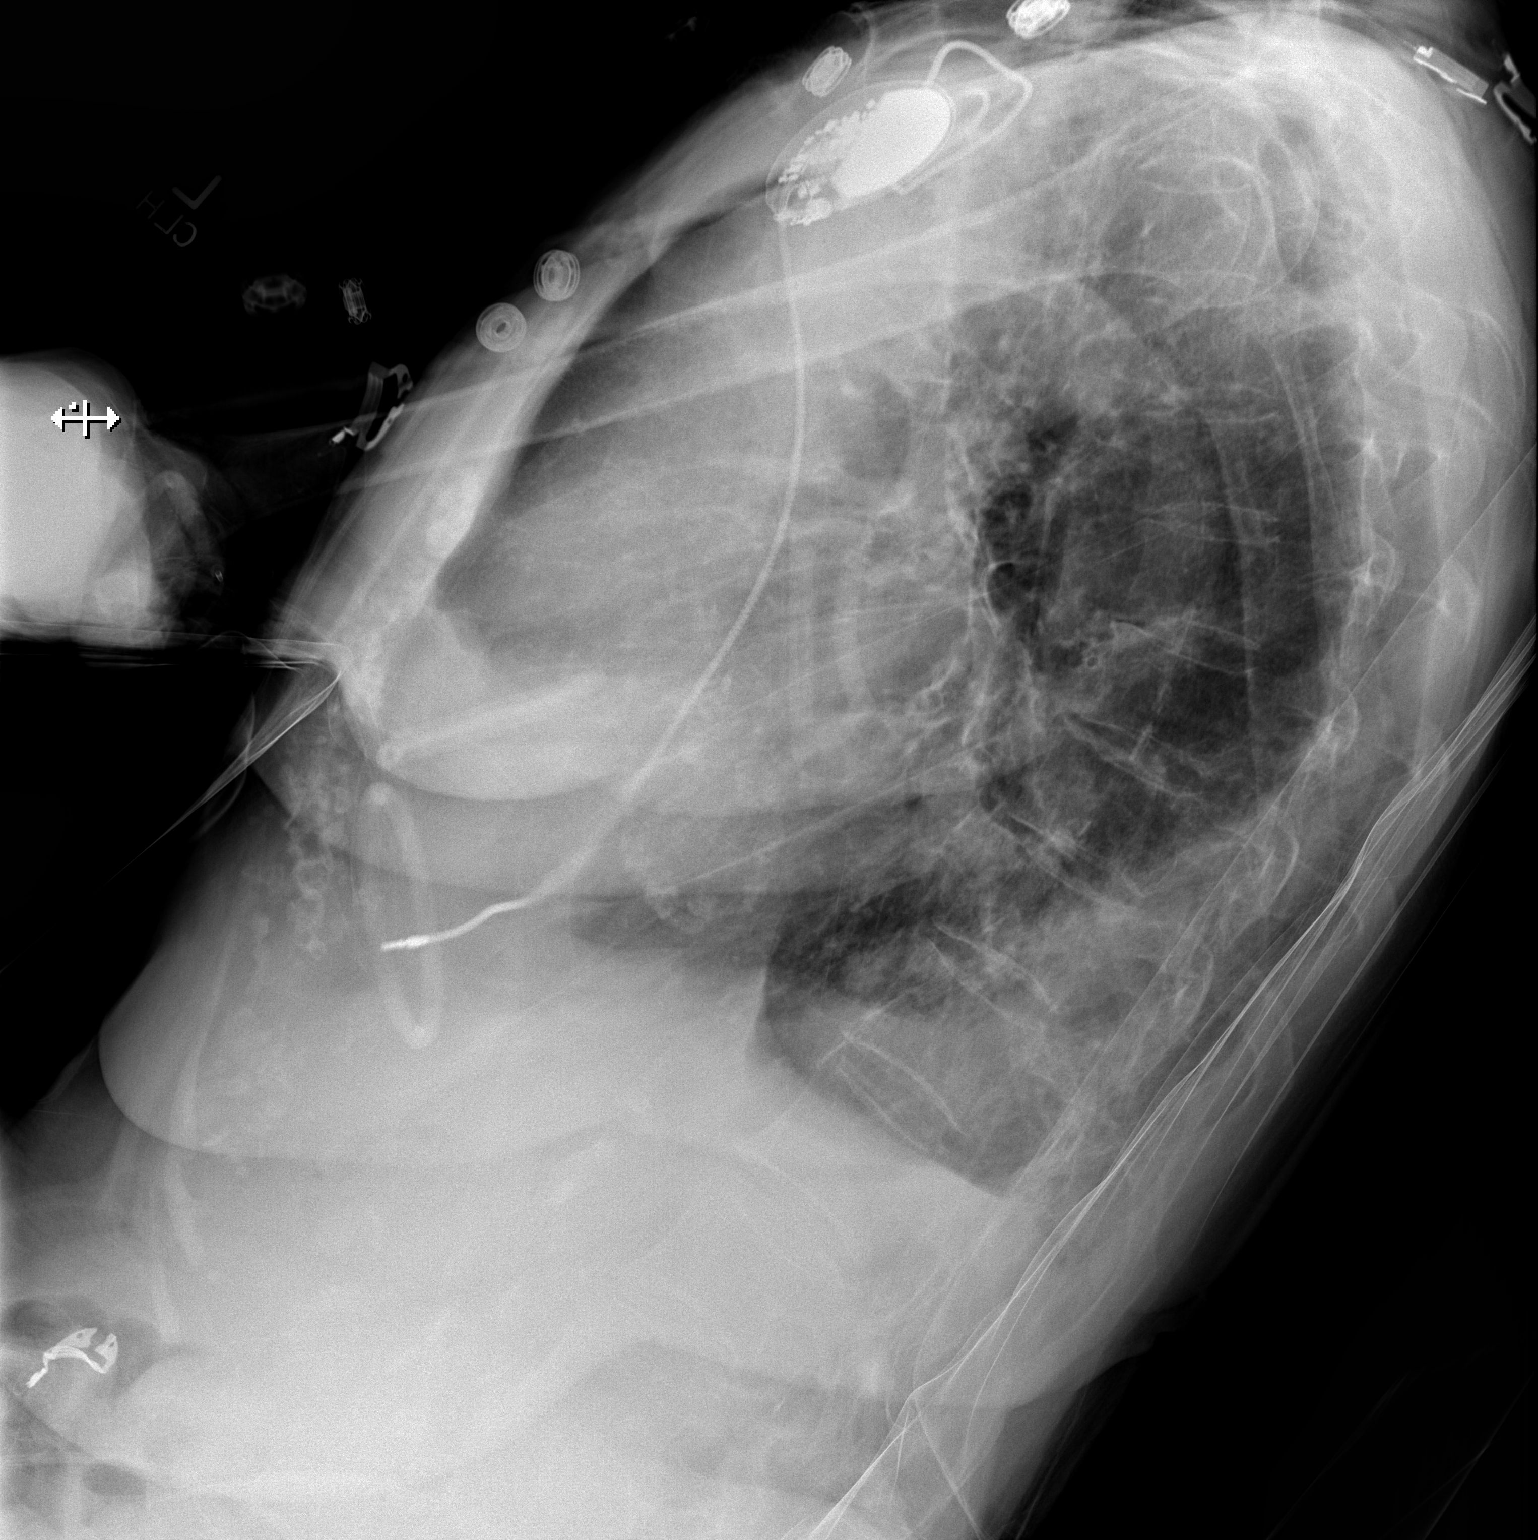

[2 of 2 positions shown; findings below may reference images not displayed]

FINDINGS: The patient has a new single lead pacing device from a left
subclavian approach. The tip of the lead is in the apex of the right
ventricle. There is cardiomegaly without edema. Small bilateral
pleural effusions have decreased since the prior examination. There
is no pulmonary edema with the lungs clear. No pneumothorax.
Atherosclerotic vascular disease of the aorta is noted.
IMPRESSION: Pacemaker in place without evidence of complication.

Decreased small bilateral pleural effusions.

Cardiomegaly without edema.

Atherosclerosis.

## 2016-06-28 IMAGING — CR DG CHEST 1V PORT
1 series · 1 of 1 positions shown · non-contrast
Comparison: Chest radiograph 10/20/2014

CLINICAL DATA: Patient status post pacemaker placement. Pain under
the left shoulder blade.

EXAM:
PORTABLE CHEST 1 VIEW

[AP]
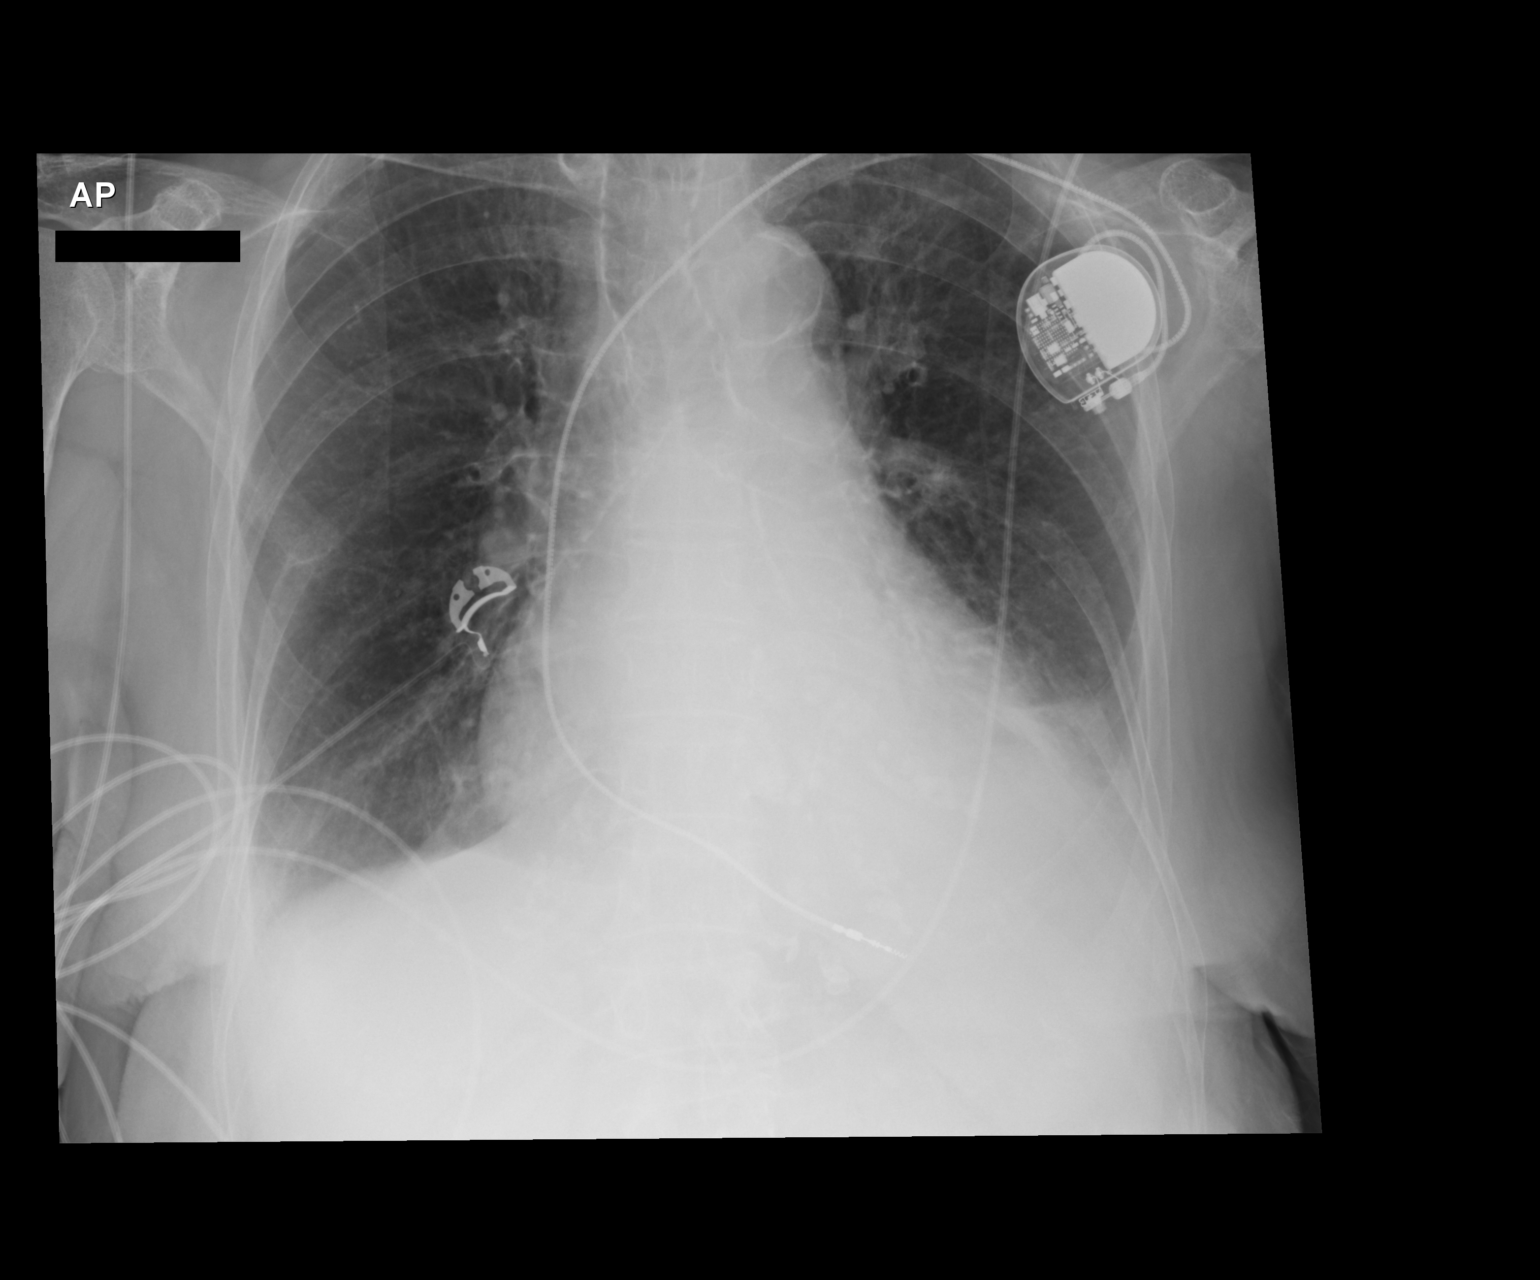

[1 of 1 positions shown; findings below may reference images not displayed]

FINDINGS: Single lead pacer apparatus overlies the left hemi thorax, leads are
stable in position. Stable cardiac and mediastinal contours.
Persistent small left pleural effusion with underlying pulmonary
consolidation. Right lung is clear. No definite pneumothorax.
IMPRESSION: Stable positioning left anterior chest wall pacer apparatus.

Small left pleural effusion with underlying pulmonary consolidation
favored to represent atelectasis.

## 2016-06-28 IMAGING — CR DG CHEST 2V
2 series · 2 of 2 positions shown · non-contrast
Comparison: 10/19/2014

CLINICAL DATA: Status post pacemaker insertion.

EXAM:
CHEST  2 VIEW

[chest lat]
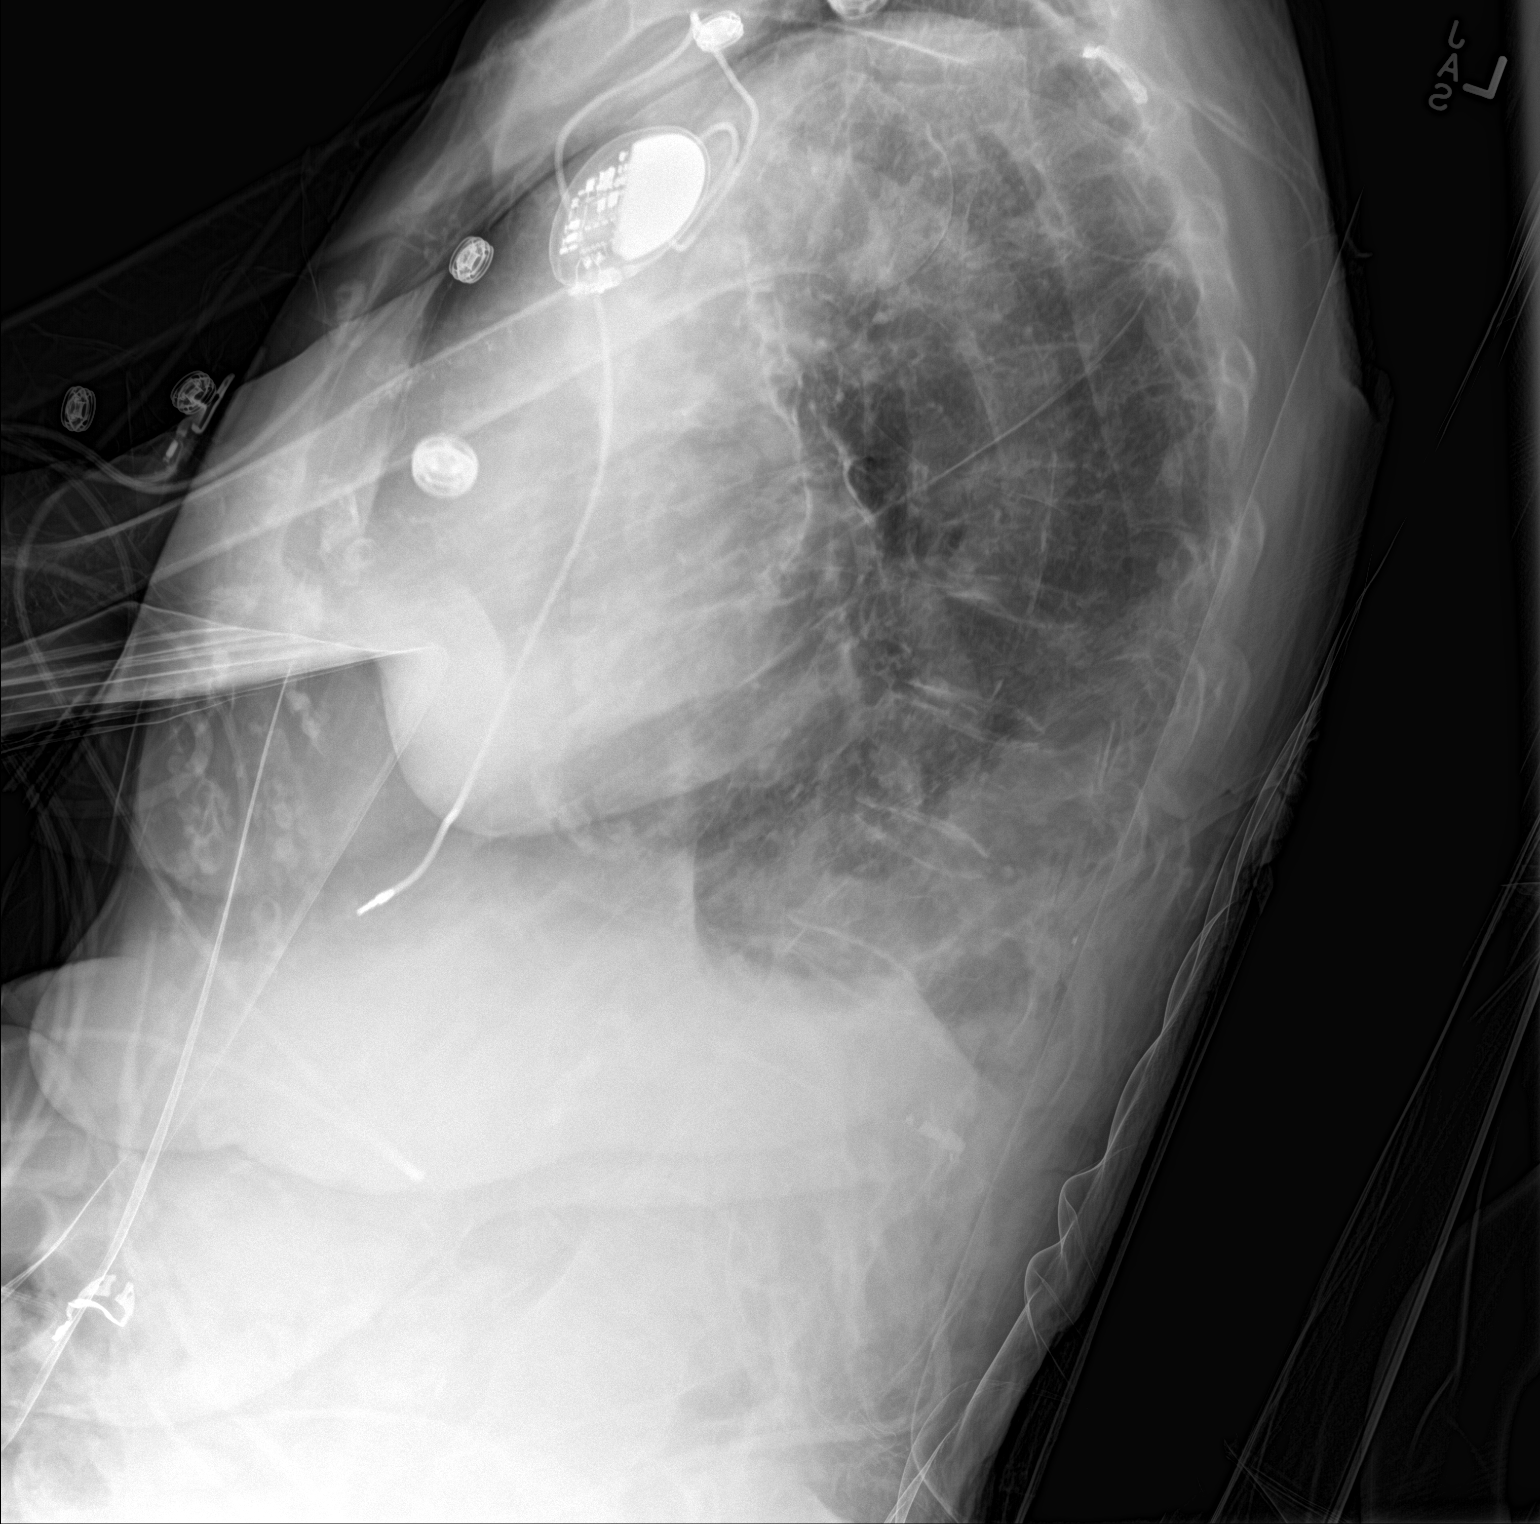

[chest ap]
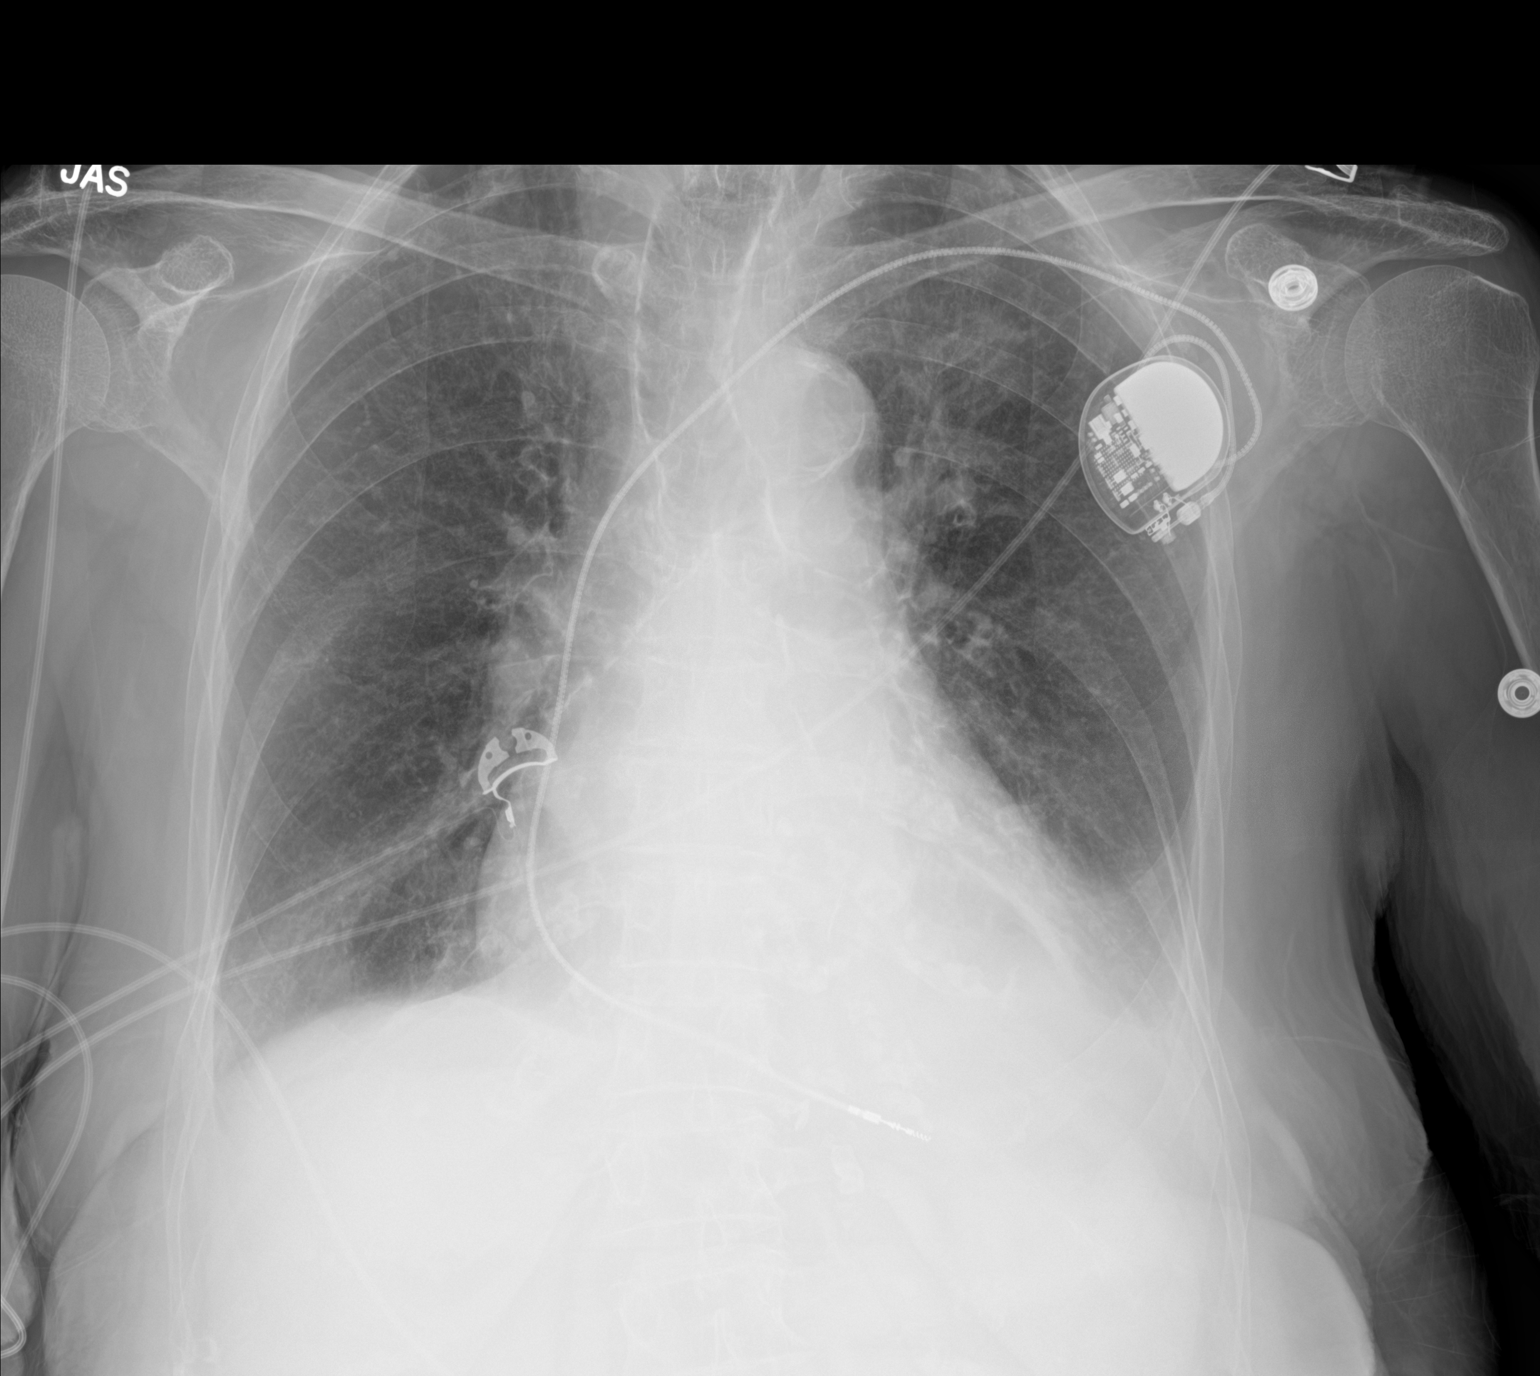

[2 of 2 positions shown; findings below may reference images not displayed]

FINDINGS: Left subclavian approach single lead pacemaker remains in place with
tip terminating over the right ventricle. The cardiac silhouette
remains mildly enlarged. Small bilateral pleural effusions, left
larger than right, have not significantly changed in size. Scarring
is again seen in the lung apices. There is minimal bibasilar opacity
which likely reflects atelectasis. No edema or pneumothorax is
identified. Aortic calcification is noted. No acute osseous
abnormality is seen.
IMPRESSION: 1. Small bilateral pleural effusions, unchanged.
2. Mild bibasilar atelectasis.
3. Cardiomegaly.
